# Patient Record
Sex: Female | Born: 1980 | Race: White | Hispanic: No | Marital: Married | Smoking: Former smoker
Health system: Southern US, Community
[De-identification: ages and names within clinical notes are randomized; demographics above are authoritative.]

## PROBLEM LIST (undated history)

## (undated) DIAGNOSIS — Z789 Other specified health status: Secondary | ICD-10-CM

## (undated) DIAGNOSIS — E785 Hyperlipidemia, unspecified: Secondary | ICD-10-CM

## (undated) DIAGNOSIS — Z8719 Personal history of other diseases of the digestive system: Secondary | ICD-10-CM

## (undated) DIAGNOSIS — R112 Nausea with vomiting, unspecified: Secondary | ICD-10-CM

## (undated) DIAGNOSIS — J189 Pneumonia, unspecified organism: Secondary | ICD-10-CM

## (undated) DIAGNOSIS — M1990 Unspecified osteoarthritis, unspecified site: Secondary | ICD-10-CM

## (undated) DIAGNOSIS — E282 Polycystic ovarian syndrome: Secondary | ICD-10-CM

## (undated) DIAGNOSIS — E669 Obesity, unspecified: Secondary | ICD-10-CM

## (undated) DIAGNOSIS — K859 Acute pancreatitis without necrosis or infection, unspecified: Secondary | ICD-10-CM

## (undated) DIAGNOSIS — D649 Anemia, unspecified: Secondary | ICD-10-CM

## (undated) DIAGNOSIS — N200 Calculus of kidney: Secondary | ICD-10-CM

## (undated) DIAGNOSIS — F419 Anxiety disorder, unspecified: Secondary | ICD-10-CM

## (undated) HISTORY — PX: TONSILLECTOMY AND ADENOIDECTOMY: SUR1326

## (undated) HISTORY — DX: Acute pancreatitis without necrosis or infection, unspecified: K85.90

## (undated) HISTORY — DX: Hyperlipidemia, unspecified: E78.5

## (undated) HISTORY — DX: Obesity, unspecified: E66.9

## (undated) HISTORY — DX: Polycystic ovarian syndrome: E28.2

## (undated) HISTORY — PX: WISDOM TOOTH EXTRACTION: SHX21

## (undated) HISTORY — DX: Calculus of kidney: N20.0

## (undated) SURGERY — Surgical Case
Anesthesia: *Unknown

---

## 2007-06-23 ENCOUNTER — Emergency Department (HOSPITAL_COMMUNITY): Admission: EM | Admit: 2007-06-23 | Discharge: 2007-06-23 | Payer: Self-pay | Admitting: Family Medicine

## 2008-03-13 ENCOUNTER — Emergency Department (HOSPITAL_COMMUNITY): Admission: EM | Admit: 2008-03-13 | Discharge: 2008-03-13 | Payer: Self-pay | Admitting: Emergency Medicine

## 2009-08-05 ENCOUNTER — Encounter (INDEPENDENT_AMBULATORY_CARE_PROVIDER_SITE_OTHER): Payer: Self-pay | Admitting: Otolaryngology

## 2009-08-05 ENCOUNTER — Ambulatory Visit (HOSPITAL_COMMUNITY): Admission: RE | Admit: 2009-08-05 | Discharge: 2009-08-06 | Payer: Self-pay | Admitting: Otolaryngology

## 2009-08-15 ENCOUNTER — Ambulatory Visit (HOSPITAL_COMMUNITY): Admission: EM | Admit: 2009-08-15 | Discharge: 2009-08-15 | Payer: Self-pay | Admitting: Emergency Medicine

## 2010-09-01 ENCOUNTER — Encounter: Payer: Self-pay | Admitting: Family Medicine

## 2010-09-09 LAB — POCT I-STAT, CHEM 8
BUN: 3 mg/dL — ABNORMAL LOW (ref 6–23)
Calcium, Ion: 1.11 mmol/L — ABNORMAL LOW (ref 1.12–1.32)
Chloride: 98 mEq/L (ref 96–112)
Creatinine, Ser: 0.7 mg/dL (ref 0.4–1.2)

## 2010-09-09 LAB — CBC
HCT: 41.2 % (ref 36.0–46.0)
Hemoglobin: 14 g/dL (ref 12.0–15.0)
Platelets: 335 10*3/uL (ref 150–400)
RBC: 4.95 MIL/uL (ref 3.87–5.11)
WBC: 8 10*3/uL (ref 4.0–10.5)

## 2012-09-10 ENCOUNTER — Other Ambulatory Visit: Payer: Self-pay | Admitting: Family Medicine

## 2012-09-10 ENCOUNTER — Other Ambulatory Visit (HOSPITAL_COMMUNITY)
Admission: RE | Admit: 2012-09-10 | Discharge: 2012-09-10 | Disposition: A | Discharge status: Home / Self Care | Payer: BC Managed Care – PPO | Source: Ambulatory Visit | Attending: Family Medicine | Admitting: Family Medicine

## 2012-09-10 DIAGNOSIS — Z01419 Encounter for gynecological examination (general) (routine) without abnormal findings: Secondary | ICD-10-CM | POA: Insufficient documentation

## 2014-07-20 ENCOUNTER — Ambulatory Visit (INDEPENDENT_AMBULATORY_CARE_PROVIDER_SITE_OTHER): Payer: 59 | Admitting: Family Medicine

## 2014-07-20 VITALS — BP 116/80 | HR 93 | Temp 97.8°F | Resp 18 | Ht 64.0 in | Wt 272.2 lb

## 2014-07-20 DIAGNOSIS — R10A0 Flank pain, unspecified side: Secondary | ICD-10-CM

## 2014-07-20 DIAGNOSIS — B354 Tinea corporis: Secondary | ICD-10-CM

## 2014-07-20 DIAGNOSIS — R109 Unspecified abdominal pain: Secondary | ICD-10-CM

## 2014-07-20 DIAGNOSIS — R1011 Right upper quadrant pain: Secondary | ICD-10-CM

## 2014-07-20 LAB — POCT CBC
Granulocyte percent: 62.5 %G (ref 37–80)
HCT, POC: 46.1 % (ref 37.7–47.9)
Hemoglobin: 14.6 g/dL (ref 12.2–16.2)
Lymph, poc: 2.7 (ref 0.6–3.4)
MCH, POC: 27.2 pg (ref 27–31.2)
MCHC: 31.6 g/dL — AB (ref 31.8–35.4)
MCV: 86.1 fL (ref 80–97)
MID (cbc): 0.6 (ref 0–0.9)
MPV: 7.9 fL (ref 0–99.8)
POC Granulocyte: 5.6 (ref 2–6.9)
POC LYMPH PERCENT: 30.4 %L (ref 10–50)
POC MID %: 7.1 %M (ref 0–12)
Platelet Count, POC: 394 10*3/uL (ref 142–424)
RBC: 5.36 M/uL (ref 4.04–5.48)
RDW, POC: 14.2 %
WBC: 9 10*3/uL (ref 4.6–10.2)

## 2014-07-20 LAB — COMPREHENSIVE METABOLIC PANEL
ALT: 14 U/L (ref 0–35)
AST: 16 U/L (ref 0–37)
Albumin: 4.5 g/dL (ref 3.5–5.2)
Alkaline Phosphatase: 78 U/L (ref 39–117)
BUN: 11 mg/dL (ref 6–23)
CO2: 26 mEq/L (ref 19–32)
Calcium: 10 mg/dL (ref 8.4–10.5)
Chloride: 100 mEq/L (ref 96–112)
Creat: 0.86 mg/dL (ref 0.50–1.10)
Glucose, Bld: 86 mg/dL (ref 70–99)
Potassium: 4.6 mEq/L (ref 3.5–5.3)
Sodium: 138 mEq/L (ref 135–145)
Total Bilirubin: 0.6 mg/dL (ref 0.2–1.2)
Total Protein: 7.6 g/dL (ref 6.0–8.3)

## 2014-07-20 MED ORDER — KETOCONAZOLE 2 % EX CREA: 1.0000 "application " | TOPICAL_CREAM | Freq: Two times a day (BID) | CUTANEOUS | Status: DC

## 2014-07-20 NOTE — Progress Notes (Signed)
° °  Subjective:    Patient ID: Alexis Howell, female    DOB: Mar 09, 1981, 34 y.o.   MRN: [id number] This chart was scribed for Elvina SidleKurt Lauenstein, MD by Littie Deedsichard Sun, Medical Scribe. This patient was seen in Room 10 and the patient's care was started at 10:20 AM.   HPI HPI Comments: Alexis Howell is a 34 y.o. female who presents to the [facility] complaining of gradual onset, intermittent, sharp RUQ abdominal pain radiating to her back and right shoulder that started months ago but worsened recently. She states her episodes of pain are very brief. She had difficulty sleeping last night due to the pain. Patient cannot associate her pain to any activity or time of day.  Patient also reports having a rash on the lateral aspect of her left upper leg for 2 months.  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 She also has a flexible job. She recently went [place] a trip to [place] including [place] with her husband. Patient is trying to have a child with her husband and is also seeking a PCP.  Review of Systems  Gastrointestinal: Positive for abdominal pain.  Musculoskeletal: Positive for back pain and arthralgias.  Skin: Positive for rash.       Objective:   Physical Exam CONSTITUTIONAL: Well developed/well nourished HEAD: Normocephalic/atraumatic EYES: EOM/PERRL ENMT: Mucous membranes moist NECK: supple no meningeal signs SPINE: entire spine nontender CV: S1/S2 noted, no murmurs/rubs/gallops noted LUNGS: Lungs are clear to auscultation bilaterally, no apparent distress ABDOMEN: Tender in RUQ and epigastrium. GU: no cva tenderness NEURO: Pt is awake/alert, moves all extremitiesx4 EXTREMITIES: pulses normal, full ROM SKIN: warm, color normal.  Annular lesion 2 cm with central clearing left thigh PSYCH: no abnormalities of mood noted   Results for orders placed or performed in visit on 07/20/14    POCT CBC  Result Value Ref Range   WBC 9.0 4.6 - 10.2 K/uL   Lymph, poc 2.7 0.6 - 3.4   POC LYMPH PERCENT 30.4 10 - 50 %L   MID (cbc) 0.6 0 - 0.9   POC MID % 7.1 0 - 12 %M   POC Granulocyte 5.6 2 - 6.9   Granulocyte percent 62.5 37 - 80 %G   RBC 5.36 4.04 - 5.48 M/uL   Hemoglobin 14.6 12.2 - 16.2 g/dL   HCT, POC 56.446.1 33.237.7 - 47.9 %   MCV 86.1 80 - 97 fL   MCH, POC 27.2 27 - 31.2 pg   MCHC 31.6 (A) 31.8 - 35.4 g/dL   RDW, POC 95.114.2 %   Platelet Count, POC 394 142 - 424 K/uL   MPV 7.9 0 - 99.8 fL        Assessment & Plan:   This chart was scribed in my presence and reviewed by me personally.    ICD-9-CM ICD-10-CM   1. RUQ pain 789.01 R10.11 US Abdomen Complete     CANCELED: US Abdomen Complete  2. Flank pain 789.09 R10.9 POCT CBC     Comprehensive metabolic panel     US Abdomen Complete     CANCELED: US Abdomen Complete  3. Tinea corporis 110.5 B35.4 ketoconazole (NIZORAL) 2 % cream     Signed, Elvina SidleKurt Lauenstein, MD

## 2014-07-20 NOTE — Patient Instructions (Addendum)
Cholelithiasis Cholelithiasis (also called gallstones) is a form of gallbladder disease in which gallstones form in your gallbladder. The gallbladder is an organ that stores bile made in the liver, which helps digest fats. Gallstones begin as small crystals and slowly grow into stones. Gallstone pain occurs when the gallbladder spasms and a gallstone is blocking the duct. Pain can also occur when a stone passes out of the duct.  RISK FACTORS  Being female.   Having multiple pregnancies. Health care providers sometimes advise removing diseased gallbladders before future pregnancies.   Being obese.  Eating a diet heavy in fried foods and fat.   Being older than 60 years and increasing age.   Prolonged use of medicines containing female hormones.   Having diabetes mellitus.   Rapidly losing weight.   Having a family history of gallstones (heredity).  SYMPTOMS  Nausea.   Vomiting.  Abdominal pain.   Yellowing of the skin (jaundice).   Sudden pain. It may persist from several minutes to several hours.  Fever.   Tenderness to the touch. In some cases, when gallstones do not move into the bile duct, people have no pain or symptoms. These are called "silent" gallstones.  TREATMENT Silent gallstones do not need treatment. In severe cases, emergency surgery may be required. Options for treatment include:  Surgery to remove the gallbladder. This is the most common treatment.  Medicines. These do not always work and may take 6-12 months or more to work.  Shock wave treatment (extracorporeal biliary lithotripsy). In this treatment an ultrasound machine sends shock waves to the gallbladder to break gallstones into smaller pieces that can pass into the intestines or be dissolved by medicine. HOME CARE INSTRUCTIONS   Only take over-the-counter or prescription medicines for pain, discomfort, or fever as directed by your health care provider.   Follow a low-fat diet until  seen again by your health care provider. Fat causes the gallbladder to contract, which can result in pain.   Follow up with your health care provider as directed. Attacks are almost always recurrent and surgery is usually required for permanent treatment.  SEEK IMMEDIATE MEDICAL CARE IF:   Your pain increases and is not controlled by medicines.   You have a fever or persistent symptoms for more than 2-3 days.   You have a fever and your symptoms suddenly get worse.   You have persistent nausea and vomiting.  MAKE SURE YOU:   Understand these instructions.  Will watch your condition.  Will get help right away if you are not doing well or get worse. Document Released: 06/02/2005 Document Revised: 02/06/2013 Document Reviewed: 11/28/2012 [facility] Patient Information 2015 [place], [place]. This information is not intended to replace advice given to you by your health care provider. Make sure you discuss any questions you have with your health care provider. Body Ringworm Ringworm (tinea corporis) is a fungal infection of the skin on the body. This infection is not caused by worms, but is actually caused by a fungus. Fungus normally lives on the top of your skin and can be useful. However, in the case of ringworms, the fungus grows out of control and causes a skin infection. It can involve any area of skin on the body and can spread easily from one person to another (contagious). Ringworm is a common problem for children, but it can affect adults as well. Ringworm is also often found in athletes, especially wrestlers who share equipment and mats.  CAUSES  Ringworm of the body  is caused by a fungus called dermatophyte. It can spread by:  Touchingother people who are infected.  Touchinginfected pets.  Touching or sharingobjects that have been in contact with the infected person or pet (hats, combs, towels, clothing, sports equipment). SYMPTOMS   Itchy, raised red spots and bumps on  the skin.  Ring-shaped rash.  Redness near the border of the rash with a clear center.  Dry and scaly skin on or around the rash. Not every person develops a ring-shaped rash. Some develop only the red, scaly patches. DIAGNOSIS  Most often, ringworm can be diagnosed by performing a skin exam. Your caregiver may choose to take a skin scraping from the affected area. The sample will be examined under the microscope to see if the fungus is present.  TREATMENT  Body ringworm may be treated with a topical antifungal cream or ointment. Sometimes, an antifungal shampoo that can be used on your body is prescribed. You may be prescribed antifungal medicines to take by mouth if your ringworm is severe, keeps coming back, or lasts a long time.  HOME CARE INSTRUCTIONS   Only take over-the-counter or prescription medicines as directed by your caregiver.  Wash the infected area and dry it completely before applying yourcream or ointment.  When using antifungal shampoo to treat the ringworm, leave the shampoo on the body for 3-5 minutes before rinsing.   Wear loose clothing to stop clothes from rubbing and irritating the rash.  Wash or change your bed sheets every night while you have the rash.  Have your pet treated by your veterinarian if it has the same infection. To prevent ringworm:   Practice good hygiene.  Wear sandals or shoes in public places and showers.  Do not share personal items with others.  Avoid touching red patches of skin on other people.  Avoid touching pets that have bald spots or wash your hands after doing so. SEEK MEDICAL CARE IF:   Your rash continues to spread after 7 days of treatment.  Your rash is not gone in 4 weeks.  The area around your rash becomes red, warm, tender, and swollen. Document Released: 06/03/2000 Document Revised: 02/29/2012 Document Reviewed: 12/19/2011 [facility] Patient Information 2015 [place]. This information is not intended to  replace advice given to you by your health care provider. Make sure you discuss any questions you have with your health care provider.

## 2014-07-21 ENCOUNTER — Ambulatory Visit (HOSPITAL_COMMUNITY)
Admission: RE | Admit: 2014-07-21 | Discharge: 2014-07-21 | Disposition: A | Discharge status: Home / Self Care | Payer: 59 | Source: Ambulatory Visit | Attending: Family Medicine | Admitting: Family Medicine

## 2014-07-21 ENCOUNTER — Other Ambulatory Visit: Payer: Self-pay | Admitting: Family Medicine

## 2014-07-21 DIAGNOSIS — K8020 Calculus of gallbladder without cholecystitis without obstruction: Secondary | ICD-10-CM

## 2014-07-21 DIAGNOSIS — K760 Fatty (change of) liver, not elsewhere classified: Secondary | ICD-10-CM | POA: Insufficient documentation

## 2014-07-21 DIAGNOSIS — R109 Unspecified abdominal pain: Secondary | ICD-10-CM

## 2014-07-21 DIAGNOSIS — R1011 Right upper quadrant pain: Secondary | ICD-10-CM | POA: Diagnosis present

## 2014-07-22 ENCOUNTER — Telehealth: Payer: Self-pay

## 2014-07-22 NOTE — Telephone Encounter (Signed)
Pt calling with questions about upcoming appt w/ [facility]. Patient wants to know details of appt and results of ultrasound. Please advise. CB # [phone]

## 2014-07-23 ENCOUNTER — Other Ambulatory Visit: Payer: Self-pay

## 2014-07-23 NOTE — Telephone Encounter (Signed)
Spoke with pt, I explained to her that her ultrasound showed she does have gallstones. Her referral to the surgeon is to see if she may need to have them removed because this may be causing her pain. Pt states she will try over the counter medication for the pain for now. I advised her to let me know if she needs anything stronger if the pain gets worse. Pt agreed with plan.

## 2014-07-29 NOTE — Pre-Procedure Instructions (Signed)
[facility]hel[facility]  07/29/2014   Your procedure is scheduled on: Friday, Feb. 12th    [facility] at  8:30 AM.   Call this number if you have problems the morning of surgery: [phone]   Remember:   Do not eat food or drink liquids after midnight Thursday.   Take these medicines the morning of surgery with A SIP OF WATER: none   Do not wear jewelry, make-up or nail polish.  Do not wear lotions, powders, or perfumes. You may NOT wear deodorant the day of surgery.  Do not shave underarms & legs 48 hours prior to surgery.    Do not bring valuables to the hospital.  [facility] is not responsible for any belongings or valuables.               Contacts, dentures or bridgework may not be worn into surgery.  Leave suitcase in the car. After surgery it may be brought to your room.  For patients admitted to the hospital, discharge time is determined by your treatment team.               Patients discharged the day of surgery will not be allowed to drive home.   Name and phone number of your driver:    Special Instructions: "Preparing for Surgery" instruction sheet.     Please read over the following fact sheets that you were given: Pain Booklet and Surgical Site Infection Prevention

## 2014-07-30 ENCOUNTER — Encounter (HOSPITAL_COMMUNITY): Payer: Self-pay

## 2014-07-30 ENCOUNTER — Ambulatory Visit (INDEPENDENT_AMBULATORY_CARE_PROVIDER_SITE_OTHER): Payer: Self-pay | Admitting: General Surgery

## 2014-07-30 ENCOUNTER — Encounter (HOSPITAL_COMMUNITY)
Admission: RE | Admit: 2014-07-30 | Discharge: 2014-07-30 | Disposition: A | Discharge status: Home / Self Care | Payer: 59 | Source: Ambulatory Visit | Attending: General Surgery | Admitting: General Surgery

## 2014-07-30 DIAGNOSIS — K8020 Calculus of gallbladder without cholecystitis without obstruction: Secondary | ICD-10-CM | POA: Diagnosis present

## 2014-07-30 DIAGNOSIS — Z5309 Procedure and treatment not carried out because of other contraindication: Secondary | ICD-10-CM | POA: Diagnosis not present

## 2014-07-30 LAB — CBC
HCT: 44.6 % (ref 36.0–46.0)
HEMOGLOBIN: 14.7 g/dL (ref 12.0–15.0)
MCH: 28.2 pg (ref 26.0–34.0)
MCHC: 33 g/dL (ref 30.0–36.0)
MCV: 85.6 fL (ref 78.0–100.0)
Platelets: 355 10*3/uL (ref 150–400)
RBC: 5.21 MIL/uL — AB (ref 3.87–5.11)
RDW: 13.4 % (ref 11.5–15.5)
WBC: 9.7 10*3/uL (ref 4.0–10.5)

## 2014-07-30 LAB — HCG, SERUM, QUALITATIVE: PREG SERUM: POSITIVE — AB

## 2014-07-30 LAB — BASIC METABOLIC PANEL
Anion gap: 11 (ref 5–15)
BUN: 11 mg/dL (ref 6–23)
CHLORIDE: 102 mmol/L (ref 96–112)
CO2: 24 mmol/L (ref 19–32)
CREATININE: 0.84 mg/dL (ref 0.50–1.10)
Calcium: 9.8 mg/dL (ref 8.4–10.5)
GFR calc Af Amer: >90 mL/min (ref >90)
GFR calc non Af Amer: >90 mL/min (ref >90)
GLUCOSE: 86 mg/dL (ref 70–99)
Potassium: 5 mmol/L (ref 3.5–5.1)
Sodium: 137 mmol/L (ref 135–145)

## 2014-07-30 NOTE — H&P (Signed)
Alexis Howell 07/24/2014 1:26 PM Location: [facility] Patient #: [id number] DOB: 05/08/1981 Married / Language: English / Race: White Female  History of Present Illness (Alexis Howell M. Alexis Winebarger MD; 07/24/2014 2:00 PM) Patient words: gallstones.  The patient is a 34 year old female who presents for evaluation of gall stones. She is referred by Dr Lauenstein for evaluation of gallstones and abdominal pain. She states that she has been having some stabbing right upper quadrant pain on and off for several years that was last for about 30 seconds at a time. She initially attributed to menstrual cramps. However last week she developed pain underneath her right rib cage that radiated toward her shoulder. It lasted for about 30 minutes. She states that she's been having some ongoing soreness in that area. She had another episode Saturday night. These episodes are generally accompanied with nausea. They also generally occur about half an hour after eating. She denies any fever, chills, vomiting, diarrhea or constipation. She denies melena or hematochezia. She denies acholic stools. She denies Nsaid use. She and her husband are trying to get pregnant. She underwent an abdominal ultrasound which revealed a contracted gallbladder with numerous gallstones. No evidence of wall thickness or pericholecystic fluid. Her comprehensive metabolic panel and CBC were within normal limits.   Other Problems ([name] M [name], MD; 07/24/2014 1:59 PM) No pertinent past medical history SYMPTOMATIC CHOLELITHIASIS (574.20  K80.20)  Past Surgical History ([name], CMA; 07/24/2014 1:26 PM) Oral Surgery Tonsillectomy  Diagnostic Studies History ([name], CMA; 07/24/2014 1:26 PM) Mammogram 1-3 years ago  Allergies ([name], CMA; 07/24/2014 1:27 PM) No Known Drug Allergies02/09/2014  Medication History ([name], CMA; 07/24/2014 1:27 PM) No Current Medications  Social History (Sonya  Bynum, CMA; 07/24/2014 1:26 PM) Alcohol use Occasional alcohol use. Caffeine use Coffee. No drug use Tobacco use Former smoker.  Family History ([name], CMA; 07/24/2014 1:26 PM) First Degree Relatives No pertinent family history  Pregnancy / Birth History ([name], CMA; 07/24/2014 1:26 PM) Age at menarche 16 years. Contraceptive History Oral contraceptives. Gravida 0 Para 0 Regular periods  Review of Systems ([name] CMA; 07/24/2014 1:26 PM) General Present- Appetite Loss, Fatigue and Night Sweats. Not Present- Chills, Fever, Weight Gain and Weight Loss. HEENT Not Present- Earache, Hearing Loss, Hoarseness, Nose Bleed, Oral Ulcers, Ringing in the Ears, Seasonal Allergies, Sinus Pain, Sore Throat, Visual Disturbances, Wears glasses/contact lenses and Yellow Eyes. Respiratory Not Present- Bloody sputum, Chronic Cough, Difficulty Breathing, Snoring and Wheezing. Breast Not Present- Breast Mass, Breast Pain, Nipple Discharge and Skin Changes. Cardiovascular Not Present- Chest Pain, Difficulty Breathing Lying Down, Leg Cramps, Palpitations, Rapid Heart Rate, Shortness of Breath and Swelling of Extremities. Gastrointestinal Present- Abdominal Pain, Bloating, Nausea and Vomiting. Not Present- Bloody Stool, Change in Bowel Habits, Chronic diarrhea, Constipation, Difficulty Swallowing, Excessive gas, Gets full quickly at meals, Hemorrhoids, Indigestion and Rectal Pain. Female Genitourinary Not Present- Frequency, Nocturia, Painful Urination, Pelvic Pain and Urgency. Musculoskeletal Present- Back Pain. Not Present- Joint Pain, Joint Stiffness, Muscle Pain, Muscle Weakness and Swelling of Extremities. Neurological Not Present- Decreased Memory, Fainting, Headaches, Numbness, Seizures, Tingling, Tremor, Trouble walking and Weakness. Psychiatric Present- Change in Sleep Pattern. Not Present- Anxiety, Bipolar, Depression, Fearful and Frequent crying. Endocrine Not Present- Cold  Intolerance, Excessive Hunger, Hair Changes, Heat Intolerance, Hot flashes and New Diabetes. Hematology Not Present- Easy Bruising, Excessive bleeding, Gland problems, HIV and Persistent Infections.   Vitals ([name] CMA; 07/24/2014 1:27 PM) 07/24/2014 1:27 PM Weight: 274 lb   Height: 63in Body Surface Area: 2.35 m Body Mass Index: 48.54 kg/m Temp.: 97.4F(Temporal)  Pulse: 77 (Regular)  BP: 130/78 (Sitting, Left Arm, Standard)    Physical Exam (Chay Mazzoni M. Brooks Stotz MD; 07/24/2014 1:55 PM) General Mental Status-Alert. General Appearance-Consistent with stated age. Hydration-Well hydrated. Voice-Normal.  Head and Neck Head-normocephalic, atraumatic with no lesions or palpable masses. Trachea-midline. Thyroid Gland Characteristics - normal size and consistency.  Eye Eyeball - Bilateral-Extraocular movements intact. Sclera/Conjunctiva - Bilateral-No scleral icterus.  Chest and Lung Exam Chest and lung exam reveals -quiet, even and easy respiratory effort with no use of accessory muscles and on auscultation, normal breath sounds, no adventitious sounds and normal vocal resonance. Inspection Chest Wall - Normal. Back - normal.  Breast - Did not examine.  Cardiovascular Cardiovascular examination reveals -normal heart sounds, regular rate and rhythm with no murmurs and normal pedal pulses bilaterally.  Abdomen Inspection Inspection of the abdomen reveals - No Hernias. Skin - Scar - no surgical scars. Palpation/Percussion Palpation and Percussion of the abdomen reveal - Soft, No Rebound tenderness, No Rigidity (guarding) and No hepatosplenomegaly. Note: mild RUQ TTP. Auscultation Auscultation of the abdomen reveals - Bowel sounds normal.  Peripheral Vascular Upper Extremity Palpation - Pulses bilaterally normal.  Neurologic Neurologic evaluation reveals -alert and oriented x 3 with no impairment of recent or remote memory. Mental  Status-Normal.  Neuropsychiatric The patient's mood and affect are described as -normal. Judgment and Insight-insight is appropriate concerning matters relevant to self.  Musculoskeletal Normal Exam - Left-Upper Extremity Strength Normal and Lower Extremity Strength Normal. Normal Exam - Right-Upper Extremity Strength Normal and Lower Extremity Strength Normal.  Lymphatic Head & Neck  General Head & Neck Lymphatics: Bilateral - Description - Normal. Axillary - Did not examine. Femoral & Inguinal - Did not examine.    Assessment & Plan (Khamiyah Grefe M. Trystyn Dolley MD; 07/24/2014 1:59 PM) SYMPTOMATIC CHOLELITHIASIS (574.20  K80.20) Impression: Her symptoms are pretty classic for gallbladder disease. Between now and surgery of advised her to try to refrain from getting pregnant Current Plans  I believe the patient's symptoms are consistent with gallbladder disease.  We discussed gallbladder disease. The patient was given educational material. We discussed non-operative and operative management. We discussed the signs & symptoms of acute cholecystitis  I discussed laparoscopic cholecystectomy with IOC in detail. The patient was given educational material as well as diagrams detailing the procedure. We discussed the risks and benefits of a laparoscopic cholecystectomy including, but not limited to bleeding, infection, injury to surrounding structures such as the intestine or liver, bile leak, retained gallstones, need to convert to an open procedure, prolonged diarrhea, blood clots such as DVT, common bile duct injury, anesthesia risks, and possible need for additional procedures. We discussed the typical post-operative recovery course. I explained that the likelihood of improvement of their symptoms is good.  The patient has elected to proceed with surgery. Schedule for Surgery  Ruqayya Ventress M. Deke Tilghman, MD, FACS General, Bariatric, & Minimally Invasive Surgery [facility]  

## 2014-07-31 ENCOUNTER — Ambulatory Visit (HOSPITAL_COMMUNITY): Payer: 59 | Admitting: Anesthesiology

## 2014-07-31 ENCOUNTER — Encounter (HOSPITAL_COMMUNITY)
Admission: RE | Admit: 2014-07-31 | Discharge: 2014-07-31 | Disposition: A | Discharge status: Home / Self Care | Payer: 59 | Source: Ambulatory Visit | Attending: General Surgery | Admitting: General Surgery

## 2014-07-31 ENCOUNTER — Other Ambulatory Visit (INDEPENDENT_AMBULATORY_CARE_PROVIDER_SITE_OTHER): Payer: Self-pay | Admitting: General Surgery

## 2014-07-31 ENCOUNTER — Ambulatory Visit (INDEPENDENT_AMBULATORY_CARE_PROVIDER_SITE_OTHER): Payer: Self-pay | Admitting: General Surgery

## 2014-07-31 DIAGNOSIS — K8020 Calculus of gallbladder without cholecystitis without obstruction: Secondary | ICD-10-CM | POA: Diagnosis not present

## 2014-07-31 DIAGNOSIS — Z01818 Encounter for other preprocedural examination: Secondary | ICD-10-CM

## 2014-07-31 LAB — HCG, SERUM, QUALITATIVE: PREG SERUM: NEGATIVE

## 2014-07-31 MED ORDER — DEXTROSE 5 % IV SOLN
2.0000 g | INTRAVENOUS | Status: DC
  Filled 2014-07-31 (×2): qty 2

## 2014-07-31 NOTE — Progress Notes (Signed)
Spoke with Kenney Housemananya, nurse at Dr. Tawana ScaleWilson's office to make MD aware of negative repeat pregnancy test.

## 2014-07-31 NOTE — Progress Notes (Signed)
Labs drawn at yesterday after PAT appt.  I received call from lab stating the patient may be pregnant.  I called [facility] to let them know and was instructed to have patient come back in for another serum blood test for pregnancy.  I relayed info to patient and she will be back in around 3 pm today for redraw.  DA

## 2014-07-31 NOTE — Progress Notes (Signed)
Dr.Wilson called and stated that he is putting an order in for a third pregnancy test. 1st preg test came back positive,2nd came back neg.Pt is however trying to conceive but has issues with fertility.If this test is positive then surgery will be cancelled and Dr.Wilson has talked with pt about this.

## 2014-08-01 ENCOUNTER — Encounter (HOSPITAL_COMMUNITY): Payer: Self-pay | Admitting: *Deleted

## 2014-08-01 ENCOUNTER — Encounter (HOSPITAL_COMMUNITY)
Admission: RE | Disposition: A | Discharge status: Home / Self Care | Payer: Self-pay | Source: Ambulatory Visit | Attending: General Surgery

## 2014-08-01 ENCOUNTER — Other Ambulatory Visit (INDEPENDENT_AMBULATORY_CARE_PROVIDER_SITE_OTHER): Payer: Self-pay | Admitting: General Surgery

## 2014-08-01 ENCOUNTER — Ambulatory Visit (HOSPITAL_COMMUNITY)
Admission: RE | Admit: 2014-08-01 | Discharge: 2014-08-01 | Disposition: A | Discharge status: Home / Self Care | Payer: 59 | Source: Ambulatory Visit | Attending: General Surgery | Admitting: General Surgery

## 2014-08-01 DIAGNOSIS — Z5309 Procedure and treatment not carried out because of other contraindication: Secondary | ICD-10-CM | POA: Insufficient documentation

## 2014-08-01 DIAGNOSIS — K8020 Calculus of gallbladder without cholecystitis without obstruction: Secondary | ICD-10-CM | POA: Diagnosis not present

## 2014-08-01 LAB — HCG, SERUM, QUALITATIVE: Preg, Serum: POSITIVE

## 2014-08-01 LAB — HCG, QUANTITATIVE, PREGNANCY

## 2014-08-01 SURGERY — LAPAROSCOPIC CHOLECYSTECTOMY WITH INTRAOPERATIVE CHOLANGIOGRAM
Anesthesia: General

## 2014-08-01 MED ORDER — PROPOFOL 10 MG/ML IV BOLUS
INTRAVENOUS | Status: AC
  Filled 2014-08-01: qty 20

## 2014-08-01 MED ORDER — CHLORHEXIDINE GLUCONATE 4 % EX LIQD
1.0000 "application " | Freq: Once | CUTANEOUS | Status: DC
  Filled 2014-08-01: qty 15

## 2014-08-01 MED ORDER — LIDOCAINE HCL (CARDIAC) 20 MG/ML IV SOLN
INTRAVENOUS | Status: AC
  Filled 2014-08-01: qty 5

## 2014-08-01 MED ORDER — DEXAMETHASONE SODIUM PHOSPHATE 4 MG/ML IJ SOLN
INTRAMUSCULAR | Status: AC
  Filled 2014-08-01: qty 1

## 2014-08-01 MED ORDER — ONDANSETRON HCL 4 MG/2ML IJ SOLN
INTRAMUSCULAR | Status: AC
  Filled 2014-08-01: qty 2

## 2014-08-01 MED ORDER — SODIUM CHLORIDE 0.9 % IR SOLN
Status: DC | PRN
  Administered 2014-08-01: 1000 mL

## 2014-08-01 MED ORDER — 0.9 % SODIUM CHLORIDE (POUR BTL) OPTIME
TOPICAL | Status: DC | PRN
  Administered 2014-08-01: 1000 mL

## 2014-08-01 MED ORDER — ONDANSETRON 4 MG PO TBDP
4.0000 mg | ORAL_TABLET | Freq: Three times a day (TID) | ORAL | Status: DC | PRN
Start: 2014-08-01 — End: 2016-05-23

## 2014-08-01 MED ORDER — IOHEXOL 300 MG/ML  SOLN
INTRAMUSCULAR | Status: DC | PRN
  Administered 2014-08-01: 11:00:00

## 2014-08-01 MED ORDER — BUPIVACAINE-EPINEPHRINE 0.25% -1:200000 IJ SOLN
INTRAMUSCULAR | Status: DC | PRN
  Administered 2014-08-01: 30 mL

## 2014-08-01 MED ORDER — MIDAZOLAM HCL 2 MG/2ML IJ SOLN
INTRAMUSCULAR | Status: AC
  Filled 2014-08-01: qty 2

## 2014-08-01 MED ORDER — LACTATED RINGERS IV SOLN
INTRAVENOUS | Status: DC
  Administered 2014-08-01: 10:00:00 via INTRAVENOUS

## 2014-08-01 MED ORDER — FENTANYL CITRATE 0.05 MG/ML IJ SOLN
INTRAMUSCULAR | Status: AC
  Filled 2014-08-01: qty 5

## 2014-08-01 SURGICAL SUPPLY — 40 items
APPLIER CLIP 5 13 M/L LIGAMAX5 (MISCELLANEOUS) ×2
BANDAGE ADH SHEER 1  50/CT (GAUZE/BANDAGES/DRESSINGS) ×6 IMPLANT
BENZOIN TINCTURE PRP APPL 2/3 (GAUZE/BANDAGES/DRESSINGS) ×2 IMPLANT
BLADE SURG ROTATE 9660 (MISCELLANEOUS) IMPLANT
CANISTER SUCTION 2500CC (MISCELLANEOUS) ×2 IMPLANT
CHLORAPREP W/TINT 26ML (MISCELLANEOUS) ×2 IMPLANT
CLIP APPLIE 5 13 M/L LIGAMAX5 (MISCELLANEOUS) ×1 IMPLANT
COVER MAYO STAND STRL (DRAPES) ×2 IMPLANT
COVER SURGICAL LIGHT HANDLE (MISCELLANEOUS) ×2 IMPLANT
DRAPE C-ARM 42X72 X-RAY (DRAPES) ×2 IMPLANT
DRAPE LAPAROSCOPIC ABDOMINAL (DRAPES) ×2 IMPLANT
DRSG TEGADERM 4X4.75 (GAUZE/BANDAGES/DRESSINGS) ×2 IMPLANT
ELECT REM PT RETURN 9FT ADLT (ELECTROSURGICAL) ×2
ELECTRODE REM PT RTRN 9FT ADLT (ELECTROSURGICAL) ×1 IMPLANT
GAUZE SPONGE 2X2 8PLY STRL LF (GAUZE/BANDAGES/DRESSINGS) ×1 IMPLANT
GLOVE BIOGEL M STRL SZ7.5 (GLOVE) ×2 IMPLANT
GLOVE BIOGEL PI IND STRL 8 (GLOVE) ×1 IMPLANT
GLOVE BIOGEL PI INDICATOR 8 (GLOVE) ×1
GOWN STRL REUS W/ TWL LRG LVL3 (GOWN DISPOSABLE) ×3 IMPLANT
GOWN STRL REUS W/ TWL XL LVL3 (GOWN DISPOSABLE) ×1 IMPLANT
GOWN STRL REUS W/TWL LRG LVL3 (GOWN DISPOSABLE) ×3
GOWN STRL REUS W/TWL XL LVL3 (GOWN DISPOSABLE) ×1
KIT BASIN OR (CUSTOM PROCEDURE TRAY) ×2 IMPLANT
KIT ROOM TURNOVER OR (KITS) ×2 IMPLANT
NS IRRIG 1000ML POUR BTL (IV SOLUTION) ×2 IMPLANT
PAD ARMBOARD 7.5X6 YLW CONV (MISCELLANEOUS) ×2 IMPLANT
POUCH SPECIMEN RETRIEVAL 10MM (ENDOMECHANICALS) ×2 IMPLANT
SCISSORS LAP 5X35 DISP (ENDOMECHANICALS) ×2 IMPLANT
SET CHOLANGIOGRAPH 5 50 .035 (SET/KITS/TRAYS/PACK) ×2 IMPLANT
SET IRRIG TUBING LAPAROSCOPIC (IRRIGATION / IRRIGATOR) ×2 IMPLANT
SLEEVE ENDOPATH XCEL 5M (ENDOMECHANICALS) ×4 IMPLANT
SPECIMEN JAR SMALL (MISCELLANEOUS) ×2 IMPLANT
SPONGE GAUZE 2X2 STER 10/PKG (GAUZE/BANDAGES/DRESSINGS) ×1
SUT MNCRL AB 4-0 PS2 18 (SUTURE) ×2 IMPLANT
TOWEL OR 17X24 6PK STRL BLUE (TOWEL DISPOSABLE) ×2 IMPLANT
TOWEL OR 17X26 10 PK STRL BLUE (TOWEL DISPOSABLE) ×2 IMPLANT
TRAY LAPAROSCOPIC (CUSTOM PROCEDURE TRAY) ×2 IMPLANT
TROCAR XCEL BLUNT TIP 100MML (ENDOMECHANICALS) ×2 IMPLANT
TROCAR XCEL NON-BLD 5MMX100MML (ENDOMECHANICALS) ×2 IMPLANT
TUBING INSUFFLATION (TUBING) ×2 IMPLANT

## 2014-08-01 NOTE — Progress Notes (Signed)
Per Dr. Andrey CampanileWilson, will not do surgery today due to weakly positive pregnancy tests x2. Pt to await quantitative results prior to DC home.

## 2014-08-01 NOTE — Progress Notes (Signed)
Since pregnancy tests are inconclusive (wed = positive; Thursday = negative, today = weakly positive) and pt has been trying to get pregnant I feel safest course is to postpone surgery until she has been evaluated by an OB. If she is truly pregnant then it would not be safe to proceed with surgery today. If pt is truly not pregnant, then she will be rescheduled asap. i instructed pt to contact office next week once she has seen OB.

## 2014-08-01 NOTE — Anesthesia Preprocedure Evaluation (Addendum)
Anesthesia Evaluation  Patient identified by MRN, date of birth, ID band Patient awake    Reviewed: Allergy & Precautions, NPO status , Patient's Chart, lab work & pertinent test results, reviewed documented beta blocker date and time   Airway Mallampati: II  TM Distance: >3 FB Neck ROM: Full    Dental no notable dental hx. (+) Teeth Intact   Pulmonary former smoker,  breath sounds clear to auscultation  Pulmonary exam normal       Cardiovascular negative cardio ROS  Rhythm:Regular Rate:Normal     Neuro/Psych negative neurological ROS  negative psych ROS   GI/Hepatic Neg liver ROS, Symptomatic Cholelithiasis   Endo/Other  Morbid obesity  Renal/GU negative Renal ROS  negative genitourinary   Musculoskeletal negative musculoskeletal ROS (+)   Abdominal (+) + obese,   Peds  Hematology negative hematology ROS (+)   Anesthesia Other Findings   Reproductive/Obstetrics Had positive pregnancy test 2 days ago Negative 2 days ago repeat for today.                            Anesthesia Physical Anesthesia Plan  ASA: III  Anesthesia Plan: General   Post-op Pain Management:    Induction: Intravenous, Rapid sequence and Cricoid pressure planned  Airway Management Planned: Oral ETT  Additional Equipment:   Intra-op Plan:   Post-operative Plan: Extubation in OR  Informed Consent: I have reviewed the patients History and Physical, chart, labs and discussed the procedure including the risks, benefits and alternatives for the proposed anesthesia with the patient or authorized representative who has indicated his/her understanding and acceptance.   Dental advisory given  Plan Discussed with: CRNA, Anesthesiologist and Surgeon  Anesthesia Plan Comments:         Anesthesia Quick Evaluation

## 2014-08-01 NOTE — Progress Notes (Signed)
Pt aware of negative quantitative results, DC home with husband and personal belongings.

## 2014-08-07 ENCOUNTER — Encounter (HOSPITAL_COMMUNITY): Payer: Self-pay | Admitting: *Deleted

## 2014-08-07 NOTE — Progress Notes (Signed)
Please put orders in Epic for Same Day surgery 08-12-14 Thanks

## 2014-08-10 ENCOUNTER — Ambulatory Visit (INDEPENDENT_AMBULATORY_CARE_PROVIDER_SITE_OTHER): Payer: Self-pay | Admitting: General Surgery

## 2014-08-12 ENCOUNTER — Ambulatory Visit (HOSPITAL_COMMUNITY): Payer: 59 | Admitting: Anesthesiology

## 2014-08-12 ENCOUNTER — Encounter (HOSPITAL_COMMUNITY)
Admission: RE | Disposition: A | Discharge status: Home / Self Care | Payer: Self-pay | Source: Ambulatory Visit | Attending: General Surgery

## 2014-08-12 ENCOUNTER — Ambulatory Visit (HOSPITAL_COMMUNITY)
Admission: RE | Admit: 2014-08-12 | Discharge: 2014-08-12 | Disposition: A | Discharge status: Home / Self Care | Payer: 59 | Source: Ambulatory Visit | Attending: General Surgery | Admitting: General Surgery

## 2014-08-12 ENCOUNTER — Encounter (HOSPITAL_COMMUNITY): Payer: Self-pay | Admitting: *Deleted

## 2014-08-12 ENCOUNTER — Ambulatory Visit (HOSPITAL_COMMUNITY): Payer: 59

## 2014-08-12 DIAGNOSIS — Z6841 Body Mass Index (BMI) 40.0 and over, adult: Secondary | ICD-10-CM | POA: Insufficient documentation

## 2014-08-12 DIAGNOSIS — K8020 Calculus of gallbladder without cholecystitis without obstruction: Secondary | ICD-10-CM

## 2014-08-12 DIAGNOSIS — K8010 Calculus of gallbladder with chronic cholecystitis without obstruction: Secondary | ICD-10-CM | POA: Diagnosis not present

## 2014-08-12 DIAGNOSIS — Z87891 Personal history of nicotine dependence: Secondary | ICD-10-CM | POA: Insufficient documentation

## 2014-08-12 HISTORY — PX: CHOLECYSTECTOMY: SHX55

## 2014-08-12 SURGERY — LAPAROSCOPIC CHOLECYSTECTOMY WITH INTRAOPERATIVE CHOLANGIOGRAM
Anesthesia: General | Site: Abdomen

## 2014-08-12 MED ORDER — METOCLOPRAMIDE HCL 5 MG/ML IJ SOLN
INTRAMUSCULAR | Status: DC | PRN
  Administered 2014-08-12: 10 mg via INTRAVENOUS

## 2014-08-12 MED ORDER — ONDANSETRON HCL 4 MG/2ML IJ SOLN
INTRAMUSCULAR | Status: AC
  Filled 2014-08-12: qty 2

## 2014-08-12 MED ORDER — OXYCODONE HCL 5 MG PO TABS
5.0000 mg | ORAL_TABLET | ORAL | Status: DC | PRN
  Administered 2014-08-12: 5 mg via ORAL
  Filled 2014-08-12: qty 1

## 2014-08-12 MED ORDER — POTASSIUM CHLORIDE IN NACL 20-0.9 MEQ/L-% IV SOLN: INTRAVENOUS | Status: DC

## 2014-08-12 MED ORDER — IOHEXOL 300 MG/ML  SOLN
INTRAMUSCULAR | Status: DC | PRN
  Administered 2014-08-12: 4 mL via INTRAVENOUS

## 2014-08-12 MED ORDER — PROPOFOL 10 MG/ML IV BOLUS
INTRAVENOUS | Status: DC | PRN
  Administered 2014-08-12: 200 mg via INTRAVENOUS

## 2014-08-12 MED ORDER — CHLORHEXIDINE GLUCONATE 4 % EX LIQD: 1.0000 "application " | Freq: Once | CUTANEOUS | Status: DC

## 2014-08-12 MED ORDER — FENTANYL CITRATE 0.05 MG/ML IJ SOLN
INTRAMUSCULAR | Status: AC
  Filled 2014-08-12: qty 5

## 2014-08-12 MED ORDER — LIDOCAINE HCL (CARDIAC) 20 MG/ML IV SOLN
INTRAVENOUS | Status: DC | PRN
  Administered 2014-08-12: 75 mg via INTRAVENOUS

## 2014-08-12 MED ORDER — CEFOXITIN SODIUM 2 G IV SOLR
2.0000 g | INTRAVENOUS | Status: AC
  Administered 2014-08-12: 2 g via INTRAVENOUS

## 2014-08-12 MED ORDER — GLYCOPYRROLATE 0.2 MG/ML IJ SOLN
INTRAMUSCULAR | Status: AC
  Filled 2014-08-12: qty 4

## 2014-08-12 MED ORDER — 0.9 % SODIUM CHLORIDE (POUR BTL) OPTIME
TOPICAL | Status: DC | PRN
  Administered 2014-08-12: 1000 mL

## 2014-08-12 MED ORDER — FENTANYL CITRATE 0.05 MG/ML IJ SOLN
INTRAMUSCULAR | Status: AC
  Filled 2014-08-12: qty 2

## 2014-08-12 MED ORDER — FENTANYL CITRATE 0.05 MG/ML IJ SOLN
INTRAMUSCULAR | Status: DC | PRN
  Administered 2014-08-12: 50 ug via INTRAVENOUS
  Administered 2014-08-12: 100 ug via INTRAVENOUS
  Administered 2014-08-12 (×2): 50 ug via INTRAVENOUS

## 2014-08-12 MED ORDER — SODIUM CHLORIDE 0.9 % IJ SOLN: 3.0000 mL | Freq: Two times a day (BID) | INTRAMUSCULAR | Status: DC

## 2014-08-12 MED ORDER — DEXTROSE 5 % IV SOLN
INTRAVENOUS | Status: AC
  Filled 2014-08-12: qty 2

## 2014-08-12 MED ORDER — ROCURONIUM BROMIDE 100 MG/10ML IV SOLN
INTRAVENOUS | Status: AC
  Filled 2014-08-12: qty 1

## 2014-08-12 MED ORDER — MIDAZOLAM HCL 2 MG/2ML IJ SOLN
INTRAMUSCULAR | Status: AC
  Filled 2014-08-12: qty 2

## 2014-08-12 MED ORDER — LIDOCAINE HCL (CARDIAC) 20 MG/ML IV SOLN
INTRAVENOUS | Status: AC
  Filled 2014-08-12: qty 5

## 2014-08-12 MED ORDER — ONDANSETRON HCL 4 MG/2ML IJ SOLN
INTRAMUSCULAR | Status: DC | PRN
  Administered 2014-08-12: 4 mg via INTRAVENOUS

## 2014-08-12 MED ORDER — NEOSTIGMINE METHYLSULFATE 10 MG/10ML IV SOLN
INTRAVENOUS | Status: AC
  Filled 2014-08-12: qty 1

## 2014-08-12 MED ORDER — SODIUM CHLORIDE 0.9 % IV SOLN: 250.0000 mL | INTRAVENOUS | Status: DC | PRN

## 2014-08-12 MED ORDER — OXYCODONE HCL 5 MG PO TABS: 5.0000 mg | ORAL_TABLET | Freq: Once | ORAL | Status: DC | PRN

## 2014-08-12 MED ORDER — MORPHINE SULFATE 10 MG/ML IJ SOLN: 1.0000 mg | INTRAMUSCULAR | Status: DC | PRN

## 2014-08-12 MED ORDER — PROMETHAZINE HCL 25 MG/ML IJ SOLN
INTRAMUSCULAR | Status: AC
  Filled 2014-08-12: qty 1

## 2014-08-12 MED ORDER — DEXAMETHASONE SODIUM PHOSPHATE 10 MG/ML IJ SOLN
INTRAMUSCULAR | Status: DC | PRN
  Administered 2014-08-12: 10 mg via INTRAVENOUS

## 2014-08-12 MED ORDER — ESMOLOL HCL 10 MG/ML IV SOLN
INTRAVENOUS | Status: DC | PRN
  Administered 2014-08-12: 30 mg via INTRAVENOUS

## 2014-08-12 MED ORDER — ESMOLOL HCL 10 MG/ML IV SOLN
INTRAVENOUS | Status: AC
  Filled 2014-08-12: qty 10

## 2014-08-12 MED ORDER — ACETAMINOPHEN 325 MG PO TABS: 650.0000 mg | ORAL_TABLET | ORAL | Status: DC | PRN

## 2014-08-12 MED ORDER — NEOSTIGMINE METHYLSULFATE 10 MG/10ML IV SOLN
INTRAVENOUS | Status: DC | PRN
  Administered 2014-08-12: 5 mg via INTRAVENOUS

## 2014-08-12 MED ORDER — ACETAMINOPHEN 650 MG RE SUPP
650.0000 mg | RECTAL | Status: DC | PRN
  Filled 2014-08-12: qty 1

## 2014-08-12 MED ORDER — BUPIVACAINE-EPINEPHRINE (PF) 0.25% -1:200000 IJ SOLN
INTRAMUSCULAR | Status: AC
  Filled 2014-08-12: qty 30

## 2014-08-12 MED ORDER — OXYCODONE HCL 5 MG/5ML PO SOLN
5.0000 mg | Freq: Once | ORAL | Status: DC | PRN
  Filled 2014-08-12: qty 5

## 2014-08-12 MED ORDER — SODIUM CHLORIDE 0.9 % IJ SOLN: 3.0000 mL | INTRAMUSCULAR | Status: DC | PRN

## 2014-08-12 MED ORDER — ROCURONIUM BROMIDE 100 MG/10ML IV SOLN
INTRAVENOUS | Status: DC | PRN
  Administered 2014-08-12: 50 mg via INTRAVENOUS
  Administered 2014-08-12 (×2): 10 mg via INTRAVENOUS

## 2014-08-12 MED ORDER — PROMETHAZINE HCL 25 MG/ML IJ SOLN
6.2500 mg | Freq: Once | INTRAMUSCULAR | Status: AC
  Administered 2014-08-12: 6.25 mg via INTRAVENOUS

## 2014-08-12 MED ORDER — HYDROMORPHONE HCL 1 MG/ML IJ SOLN: 0.2500 mg | INTRAMUSCULAR | Status: DC | PRN

## 2014-08-12 MED ORDER — LACTATED RINGERS IR SOLN
Status: DC | PRN
  Administered 2014-08-12: 1000 mL

## 2014-08-12 MED ORDER — PROPOFOL 10 MG/ML IV BOLUS
INTRAVENOUS | Status: AC
  Filled 2014-08-12: qty 20

## 2014-08-12 MED ORDER — GLYCOPYRROLATE 0.2 MG/ML IJ SOLN
INTRAMUSCULAR | Status: DC | PRN
  Administered 2014-08-12: .8 mg via INTRAVENOUS

## 2014-08-12 MED ORDER — OXYCODONE-ACETAMINOPHEN 5-325 MG PO TABS: 1.0000 | ORAL_TABLET | ORAL | Status: DC | PRN

## 2014-08-12 MED ORDER — MIDAZOLAM HCL 5 MG/5ML IJ SOLN
INTRAMUSCULAR | Status: DC | PRN
  Administered 2014-08-12: 2 mg via INTRAVENOUS

## 2014-08-12 MED ORDER — LACTATED RINGERS IV SOLN
INTRAVENOUS | Status: DC
  Administered 2014-08-12: 12:00:00 via INTRAVENOUS
  Administered 2014-08-12: 1000 mL via INTRAVENOUS

## 2014-08-12 SURGICAL SUPPLY — 34 items
APPLIER CLIP 5 13 M/L LIGAMAX5 (MISCELLANEOUS) ×2
APPLIER CLIP ROT 10 11.4 M/L (STAPLE)
CABLE HIGH FREQUENCY MONO STRZ (ELECTRODE) ×2 IMPLANT
CHLORAPREP W/TINT 26ML (MISCELLANEOUS) ×2 IMPLANT
CLIP APPLIE 5 13 M/L LIGAMAX5 (MISCELLANEOUS) ×1 IMPLANT
CLIP APPLIE ROT 10 11.4 M/L (STAPLE) IMPLANT
COVER MAYO STAND STRL (DRAPES) ×2 IMPLANT
DECANTER SPIKE VIAL GLASS SM (MISCELLANEOUS) ×2 IMPLANT
DEVICE TROCAR PUNCTURE CLOSURE (ENDOMECHANICALS) ×2 IMPLANT
DRAPE C-ARM 42X120 X-RAY (DRAPES) ×2 IMPLANT
DRAPE LAPAROSCOPIC ABDOMINAL (DRAPES) ×2 IMPLANT
ELECT REM PT RETURN 9FT ADLT (ELECTROSURGICAL) ×2
ELECTRODE REM PT RTRN 9FT ADLT (ELECTROSURGICAL) ×1 IMPLANT
GLOVE BIO SURGEON STRL SZ7.5 (GLOVE) ×2 IMPLANT
GLOVE BIOGEL M STRL SZ7.5 (GLOVE) IMPLANT
GLOVE BIOGEL PI IND STRL 7.0 (GLOVE) ×1 IMPLANT
GLOVE BIOGEL PI INDICATOR 7.0 (GLOVE) ×1
GOWN STRL REUS W/TWL XL LVL3 (GOWN DISPOSABLE) ×6 IMPLANT
KIT BASIN OR (CUSTOM PROCEDURE TRAY) ×2 IMPLANT
LIQUID BAND (GAUZE/BANDAGES/DRESSINGS) ×2 IMPLANT
NS IRRIG 1000ML POUR BTL (IV SOLUTION) ×2 IMPLANT
POUCH RETRIEVAL ECOSAC 10 (ENDOMECHANICALS) ×1 IMPLANT
POUCH RETRIEVAL ECOSAC 10MM (ENDOMECHANICALS) ×1
POUCH SPECIMEN RETRIEVAL 10MM (ENDOMECHANICALS) ×2 IMPLANT
SET CHOLANGIOGRAPH MIX (MISCELLANEOUS) ×2 IMPLANT
SET IRRIG TUBING LAPAROSCOPIC (IRRIGATION / IRRIGATOR) ×2 IMPLANT
SLEEVE XCEL OPT CAN 5 100 (ENDOMECHANICALS) ×4 IMPLANT
SUT MNCRL AB 4-0 PS2 18 (SUTURE) ×2 IMPLANT
TOWEL OR 17X26 10 PK STRL BLUE (TOWEL DISPOSABLE) ×2 IMPLANT
TOWEL OR NON WOVEN STRL DISP B (DISPOSABLE) ×2 IMPLANT
TRAY LAPAROSCOPIC (CUSTOM PROCEDURE TRAY) ×2 IMPLANT
TROCAR BLADELESS OPT 5 100 (ENDOMECHANICALS) ×2 IMPLANT
TROCAR XCEL BLUNT TIP 100MML (ENDOMECHANICALS) ×2 IMPLANT
TROCAR XCEL NON-BLD 11X100MML (ENDOMECHANICALS) IMPLANT

## 2014-08-12 NOTE — Discharge Instructions (Signed)
Alexis Howell, P.A. LAPAROSCOPIC SURGERY: POST OP INSTRUCTIONS Always review your discharge instruction sheet given to you by the facility where your surgery was performed. IF YOU HAVE DISABILITY OR FAMILY LEAVE FORMS, YOU MUST BRING THEM TO THE OFFICE FOR PROCESSING.   DO NOT GIVE THEM TO YOUR DOCTOR.  1. A prescription for pain medication may be given to you upon discharge.  Take your pain medication as prescribed, if needed.  If narcotic pain medicine is not needed, then you may take acetaminophen (Tylenol) or ibuprofen (Advil) as needed. 2. Take your usually prescribed medications unless otherwise directed. 3. If you need a refill on your pain medication, please contact your pharmacy.  They will contact our office to request authorization. Prescriptions will not be filled after 5pm or on week-ends. 4. You should follow a light diet the first few days after arrival home, such as soup and crackers, etc.  Be sure to include lots of fluids daily. 5. Most patients will experience some swelling and bruising in the area of the incisions.  Ice packs will help.  Swelling and bruising can take several days to resolve.  6. It is common to experience some constipation if taking pain medication after surgery.  Increasing fluid intake and taking a stool softener (such as Colace) will usually help or prevent this problem from occurring.  A mild laxative (Milk of Magnesia or Miralax) should be taken according to package instructions if there are no bowel movements after 48 hours. 7. Unless discharge instructions indicate otherwise, you may remove your bandages 24-48 hours after surgery, and you may shower at that time.  You may have steri-strips (small skin tapes) in place directly over the incision.  These strips should be left on the skin for 7-10 days.  If your surgeon used skin glue on the incision, you may shower in 24 hours.  The glue will flake off over the next 2-3 weeks.  Any sutures or  staples will be removed at the office during your follow-up visit. 8. ACTIVITIES:  You may resume regular (light) daily activities beginning the next day--such as daily self-care, walking, climbing stairs--gradually increasing activities as tolerated.  You may have sexual intercourse when it is comfortable.  Refrain from any heavy lifting or straining until approved by your doctor. a. You may drive when you are no longer taking prescription pain medication, you can comfortably wear a seatbelt, and you can safely maneuver your car and apply brakes. 9. You should see your doctor in the office for a follow-up appointment approximately 2-3 weeks after your surgery.  Make sure that you call for this appointment within a day or two after you arrive home to insure a convenient appointment time. 10. OTHER INSTRUCTIONS:DO NOT LIFT, PUSH, OR PULL ANYTHING GREATER THAN 15 POUNDS FOR 2 WEEKS  WHEN TO CALL YOUR DOCTOR: 1. Fever over 101.0 2. Inability to urinate 3. Continued bleeding from incision. 4. Increased pain, redness, or drainage from the incision. 5. Increasing abdominal pain  The clinic staff is available to answer your questions during regular business hours.  Please dont hesitate to call and ask to speak to one of the nurses for clinical concerns.  If you have a medical emergency, go to the nearest emergency room or call 911.  A surgeon from [facility] is always on call at the hospital. [address], [place], [place], [place]  [address] ? [address], [place], [place]   [address] [phone] ? [phone] ?  FAX [phone] Web site: [email or web address]     General Anesthesia, Care After Refer to this sheet in the next few weeks. These instructions provide you with information on caring for yourself after your procedure. Your health care provider may also give you more specific instructions. Your treatment has been planned according to current medical  practices, but problems sometimes occur. Call your health care provider if you have any problems or questions after your procedure. WHAT TO EXPECT AFTER THE PROCEDURE After the procedure, it is typical to experience:  Sleepiness.  Nausea and vomiting. HOME CARE INSTRUCTIONS  For the first 24 hours after general anesthesia:  Have a responsible person with you.  Do not drive a car. If you are alone, do not take public transportation.  Do not drink alcohol.  Do not take medicine that has not been prescribed by your health care provider.  Do not sign important papers or make important decisions.  You may resume a normal diet and activities as directed by your health care provider.  Change bandages (dressings) as directed.  If you have questions or problems that seem related to general anesthesia, call the hospital and ask for the anesthetist or anesthesiologist on call. SEEK MEDICAL CARE IF:  You have nausea and vomiting that continue the day after anesthesia.  You develop a rash. SEEK IMMEDIATE MEDICAL CARE IF:   You have difficulty breathing.  You have chest pain.  You have any allergic problems. Document Released: 09/12/2000 Document Revised: 06/11/2013 Document Reviewed: 12/20/2012 [facility] Patient Information 2015 [place], [place]. This information is not intended to replace advice given to you by your health care provider. Make sure you discuss any questions you have with your health care provider.

## 2014-08-12 NOTE — Op Note (Signed)
Alexis Howell [facility] [id number] [birth year] 08/12/2014  Laparoscopic Cholecystectomy with IOC Procedure Note  Indications: This patient presents with symptomatic gallbladder disease and will undergo laparoscopic cholecystectomy.  Pre-operative Diagnosis: symptomatic cholelithiasis  Post-operative Diagnosis: Calculus of gallbladder with other cholecystitis, without mention of obstruction  Surgeon: Atilano Ina   Assistants: none  Anesthesia: General endotracheal anesthesia  ASA Class: 2  Procedure Details  The patient was seen again in the Holding Room. The risks, benefits, complications, treatment options, and expected outcomes were discussed with the patient. The possibilities of reaction to medication, pulmonary aspiration, perforation of viscus, bleeding, recurrent infection, finding a normal gallbladder, the need for additional procedures, failure to diagnose a condition, the possible need to convert to an open procedure, and creating a complication requiring transfusion or operation were discussed with the patient. The likelihood of improving the patient's symptoms with return to their baseline status is good.  The patient and/or family concurred with the proposed plan, giving informed consent. The site of surgery properly noted. The patient was taken to Operating Room, identified as Alexis Howell and the procedure verified as Laparoscopic Cholecystectomy with Intraoperative Cholangiogram. A Time Out was held and the above information confirmed. Antibiotic prophylaxis was administered.   Prior to the induction of general anesthesia, antibiotic prophylaxis was administered. General endotracheal anesthesia was then administered and tolerated well. After the induction, the abdomen was prepped with Chloraprep and draped in the sterile fashion. The patient was positioned in the supine position.  Local anesthetic agent was injected into the skin near the umbilicus and an incision made.  We dissected down to the abdominal fascia with blunt dissection.  The fascia was incised vertically and we entered the peritoneal cavity bluntly.  A pursestring suture of 0-Vicryl was placed around the fascial opening.  The Hasson cannula was inserted and secured with the stay suture.  Pneumoperitoneum was then created with CO2 and tolerated well without any adverse changes in the patient's vital signs. An 5-mm port was placed in the subxiphoid position.  Two 5-mm ports were placed in the right upper quadrant. All skin incisions were infiltrated with a local anesthetic agent before making the incision and placing the trocars.   We positioned the patient in reverse Trendelenburg, tilted slightly to the patient's left.  The gallbladder was identified, the fundus grasped and retracted cephalad. Adhesions were lysed bluntly and with the electrocautery where indicated, taking care not to injure any adjacent organs or viscus. The infundibulum was grasped and retracted laterally, exposing the peritoneum overlying the triangle of Calot. This was then divided and exposed in a blunt fashion. A critical view of the cystic duct and cystic artery was obtained.  The cystic duct was clearly identified and bluntly dissected circumferentially. The cystic duct was ligated with a clip distally.   An incision was made in the cystic duct and the [facility] cholangiogram catheter introduced. The catheter was secured using a clip. A cholangiogram was then obtained which showed good visualization of the distal and proximal biliary tree with no sign of filling defects or obstruction.  Contrast flowed easily into the duodenum. The catheter was then removed.   The cystic duct was then ligated with clips and divided. The cystic artery which had been identified & issected free was ligated with clips and divided as well.   The gallbladder was dissected from the liver bed in retrograde fashion with the electrocautery. The gallbladder was removed  and placed in an Endocatch sac.  The gallbladder and Endocatch sac  were then removed through the umbilical port site. The liver bed was irrigated and inspected. Hemostasis was achieved with the electrocautery. Copious irrigation was utilized and was repeatedly aspirated until clear.  The pursestring suture was used to close the umbilical fascia.  An additional interrupted 0 vicryl suture was placed using an endoclose.   We again inspected the right upper quadrant for hemostasis.  The umbilical closure was inspected and there was no air leak and nothing trapped within the closure. Pneumoperitoneum was released as we removed the trocars.  4-0 Monocryl was used to close the skin.   Liquid band exceed was applied. The patient was then extubated and brought to the recovery room in stable condition. Instrument, sponge, and needle counts were correct at closure and at the conclusion of the case.   Findings: Chronic Cholecystitis with Cholelithiasis; slightly intrahepatic GB; somewhat fatty liver  Estimated Blood Loss: Minimal         Drains: none         Specimens: Gallbladder           Complications: None; patient tolerated the procedure well.         Disposition: PACU - hemodynamically stable.         Condition: stable  Alexis SellaEric M. Andrey CampanileWilson, MD, FACS General, Bariatric, & Minimally Invasive Surgery [facility], [place]

## 2014-08-12 NOTE — Anesthesia Preprocedure Evaluation (Addendum)
Anesthesia Evaluation  Patient identified by MRN, date of birth, ID band Patient awake    Reviewed: Allergy & Precautions, H&P , NPO status , Patient's Chart, lab work & pertinent test results  Airway Mallampati: II  TM Distance: >3 FB Neck ROM: Full    Dental no notable dental hx. (+) Teeth Intact, Dental Advisory Given   Pulmonary neg pulmonary ROS, former smoker,  breath sounds clear to auscultation  Pulmonary exam normal       Cardiovascular negative cardio ROS  Rhythm:Regular Rate:Normal     Neuro/Psych negative neurological ROS  negative psych ROS   GI/Hepatic negative GI ROS, Neg liver ROS,   Endo/Other  Morbid obesity  Renal/GU negative Renal ROS  negative genitourinary   Musculoskeletal   Abdominal   Peds  Hematology negative hematology ROS (+)   Anesthesia Other Findings   Reproductive/Obstetrics negative OB ROS                            Anesthesia Physical Anesthesia Plan  ASA: II  Anesthesia Plan: General   Post-op Pain Management:    Induction: Intravenous  Airway Management Planned: Oral ETT  Additional Equipment:   Intra-op Plan:   Post-operative Plan: Extubation in OR  Informed Consent: I have reviewed the patients History and Physical, chart, labs and discussed the procedure including the risks, benefits and alternatives for the proposed anesthesia with the patient or authorized representative who has indicated his/her understanding and acceptance.   Dental advisory given  Plan Discussed with: CRNA  Anesthesia Plan Comments:         Anesthesia Quick Evaluation

## 2014-08-12 NOTE — Transfer of Care (Signed)
Immediate Anesthesia Transfer of Care Note  Patient: Alexis Howell  Procedure(s) Performed: Procedure(s): LAPAROSCOPIC CHOLECYSTECTOMY WITH INTRAOPERATIVE CHOLANGIOGRAM (N/A)  Patient Location: PACU  Anesthesia Type:General  Level of Consciousness: awake, alert , oriented and patient cooperative  Airway & Oxygen Therapy: Patient Spontanous Breathing and Patient connected to face mask oxygen  Post-op Assessment: Report given to RN and Post -op Vital signs reviewed and stable  Post vital signs: Reviewed and stable  Last Vitals:  Filed Vitals:   08/12/14 1317  BP: 143/80  Pulse: 86  Temp:   Resp: 10    Complications: No apparent anesthesia complications

## 2014-08-12 NOTE — Anesthesia Procedure Notes (Signed)
Procedure Name: Intubation Date/Time: 08/12/2014 11:41 AM Performed by: Elyn PeersALLEN, Sheccid Lahmann J Pre-anesthesia Checklist: Patient identified, Emergency Drugs available, Suction available, Patient being monitored and Timeout performed Patient Re-evaluated:Patient Re-evaluated prior to inductionOxygen Delivery Method: Circle system utilized Preoxygenation: Pre-oxygenation with 100% oxygen Intubation Type: IV induction Ventilation: Mask ventilation without difficulty and Oral airway inserted - appropriate to patient size Laryngoscope Size: Miller and 3 Grade View: Grade I Tube type: Oral Tube size: 7.5 mm Number of attempts: 1 Airway Equipment and Method: Stylet Placement Confirmation: ETT inserted through vocal cords under direct vision,  positive ETCO2 and breath sounds checked- equal and bilateral Secured at: 22 cm Tube secured with: Tape Dental Injury: Teeth and Oropharynx as per pre-operative assessment

## 2014-08-12 NOTE — H&P (View-Only) (Signed)
Alexis Howell L[facility] 07/24/2014 1:26 PM Location: [facility] Patient #: [id number] DOB: 1981-05-13 Married / Language: English / Race: White Female  History of Present Illness Alexis Howell M. Alexis Rincon MD; 07/24/2014 2:00 PM) Patient words: gallstones.  The patient is a 34 year old female who presents for evaluation of gall stones. She is referred by Dr Milus Glazier for evaluation of gallstones and abdominal pain. She states that she has been having some stabbing right upper quadrant pain on and off for several years that was last for about 30 seconds at a time. She initially attributed to menstrual cramps. However last week she developed pain underneath her right rib cage that radiated toward her shoulder. It lasted for about 30 minutes. She states that she's been having some ongoing soreness in that area. She had another episode Saturday night. These episodes are generally accompanied with nausea. They also generally occur about half an hour after eating. She denies any fever, chills, vomiting, diarrhea or constipation. She denies melena or hematochezia. She denies acholic stools. She denies Nsaid use. She and her husband are trying to get pregnant. She underwent an abdominal ultrasound which revealed a contracted gallbladder with numerous gallstones. No evidence of wall thickness or pericholecystic fluid. Her comprehensive metabolic panel and CBC were within normal limits.   Other Problems Atilano Ina, MD; 07/24/2014 1:59 PM) No pertinent past medical history SYMPTOMATIC CHOLELITHIASIS (574.20  K80.20)  Past Surgical History Gilmer Mor, CMA; 07/24/2014 1:26 PM) Oral Surgery Tonsillectomy  Diagnostic Studies History Gilmer Mor, CMA; 07/24/2014 1:26 PM) Mammogram 1-3 years ago  Allergies Lamar Laundry Howell, CMA; 07/24/2014 1:27 PM) No Known Drug Allergies02/09/2014  Medication History Gilmer Mor, CMA; 07/24/2014 1:27 PM) No Current Medications  Social History Gilmer Mor, CMA; 07/24/2014 1:26 PM) Alcohol use Occasional alcohol use. Caffeine use Coffee. No drug use Tobacco use Former smoker.  Family History Gilmer Mor, CMA; 07/24/2014 1:26 PM) First Degree Relatives No pertinent family history  Pregnancy / Birth History Gilmer Mor, CMA; 07/24/2014 1:26 PM) Age at menarche 16 years. Contraceptive History Oral contraceptives. Gravida 0 Para 0 Regular periods  Review of Systems (Alexis Howell CMA; 07/24/2014 1:26 PM) General Present- Appetite Loss, Fatigue and Night Sweats. Not Present- Chills, Fever, Weight Gain and Weight Loss. HEENT Not Present- Earache, Hearing Loss, Hoarseness, Nose Bleed, Oral Ulcers, Ringing in the Ears, Seasonal Allergies, Sinus Pain, Sore Throat, Visual Disturbances, Wears glasses/contact lenses and Yellow Eyes. Respiratory Not Present- Bloody sputum, Chronic Cough, Difficulty Breathing, Snoring and Wheezing. Breast Not Present- Breast Mass, Breast Pain, Nipple Discharge and Skin Changes. Cardiovascular Not Present- Chest Pain, Difficulty Breathing Lying Down, Leg Cramps, Palpitations, Rapid Heart Rate, Shortness of Breath and Swelling of Extremities. Gastrointestinal Present- Abdominal Pain, Bloating, Nausea and Vomiting. Not Present- Bloody Stool, Change in Bowel Habits, Chronic diarrhea, Constipation, Difficulty Swallowing, Excessive gas, Gets full quickly at meals, Hemorrhoids, Indigestion and Rectal Pain. Female Genitourinary Not Present- Frequency, Nocturia, Painful Urination, Pelvic Pain and Urgency. Musculoskeletal Present- Back Pain. Not Present- Joint Pain, Joint Stiffness, Muscle Pain, Muscle Weakness and Swelling of Extremities. Neurological Not Present- Decreased Memory, Fainting, Headaches, Numbness, Seizures, Tingling, Tremor, Trouble walking and Weakness. Psychiatric Present- Change in Sleep Pattern. Not Present- Anxiety, Bipolar, Depression, Fearful and Frequent crying. Endocrine Not Present- Cold  Intolerance, Excessive Hunger, Hair Changes, Heat Intolerance, Hot flashes and New Diabetes. Hematology Not Present- Easy Bruising, Excessive bleeding, Gland problems, HIV and Persistent Infections.   Vitals (Alexis Howell CMA; 07/24/2014 1:27 PM) 07/24/2014 1:27 PM Weight: 274 lb  Height: 63in Body Surface Area: 2.35 m Body Mass Index: 48.54 kg/m Temp.: 97.41F(Temporal)  Pulse: 77 (Regular)  BP: 130/78 (Sitting, Left Arm, Standard)    Physical Exam Alexis Howell(Cederick Broadnax M. Zulay Corrie MD; 07/24/2014 1:55 PM) General Mental Status-Alert. General Appearance-Consistent with stated age. Hydration-Well hydrated. Voice-Normal.  Head and Neck Head-normocephalic, atraumatic with no lesions or palpable masses. Trachea-midline. Thyroid Gland Characteristics - normal size and consistency.  Eye Eyeball - Bilateral-Extraocular movements intact. Sclera/Conjunctiva - Bilateral-No scleral icterus.  Chest and Lung Exam Chest and lung exam reveals -quiet, even and easy respiratory effort with no use of accessory muscles and on auscultation, normal breath sounds, no adventitious sounds and normal vocal resonance. Inspection Chest Wall - Normal. Back - normal.  Breast - Did not examine.  Cardiovascular Cardiovascular examination reveals -normal heart sounds, regular rate and rhythm with no murmurs and normal pedal pulses bilaterally.  Abdomen Inspection Inspection of the abdomen reveals - No Hernias. Skin - Scar - no surgical scars. Palpation/Percussion Palpation and Percussion of the abdomen reveal - Soft, No Rebound tenderness, No Rigidity (guarding) and No hepatosplenomegaly. Note: mild RUQ TTP. Auscultation Auscultation of the abdomen reveals - Bowel sounds normal.  Peripheral Vascular Upper Extremity Palpation - Pulses bilaterally normal.  Neurologic Neurologic evaluation reveals -alert and oriented x 3 with no impairment of recent or remote memory. Mental  Status-Normal.  Neuropsychiatric The patient's mood and affect are described as -normal. Judgment and Insight-insight is appropriate concerning matters relevant to self.  Musculoskeletal Normal Exam - Left-Upper Extremity Strength Normal and Lower Extremity Strength Normal. Normal Exam - Right-Upper Extremity Strength Normal and Lower Extremity Strength Normal.  Lymphatic Head & Neck  General Head & Neck Lymphatics: Bilateral - Description - Normal. Axillary - Did not examine. Femoral & Inguinal - Did not examine.    Assessment & Plan Alexis Howell(Rodrigues Urbanek M. Aurilla Coulibaly MD; 07/24/2014 1:59 PM) SYMPTOMATIC CHOLELITHIASIS (574.20  K80.20) Impression: Her symptoms are pretty classic for gallbladder disease. Between now and surgery of advised her to try to refrain from getting pregnant Current Plans  I believe the patient's symptoms are consistent with gallbladder disease.  We discussed gallbladder disease. The patient was given Agricultural engineereducational material. We discussed non-operative and operative management. We discussed the signs & symptoms of acute cholecystitis  I discussed laparoscopic cholecystectomy with IOC in detail. The patient was given educational material as well as diagrams detailing the procedure. We discussed the risks and benefits of a laparoscopic cholecystectomy including, but not limited to bleeding, infection, injury to surrounding structures such as the intestine or liver, bile leak, retained gallstones, need to convert to an open procedure, prolonged diarrhea, blood clots such as DVT, common bile duct injury, anesthesia risks, and possible need for additional procedures. We discussed the typical post-operative recovery course. I explained that the likelihood of improvement of their symptoms is good.  The patient has elected to proceed with surgery. Schedule for Surgery  Mary Sellaric M. Andrey CampanileWilson, MD, FACS General, Bariatric, & Minimally Invasive Surgery [facility], [place]

## 2014-08-12 NOTE — Interval H&P Note (Signed)
History and Physical Interval Note:  08/12/2014 11:03 AM  Alexis Howell  has presented today for surgery, with the diagnosis of cholelithiasis  The various methods of treatment have been discussed with the patient and family. After consideration of risks, benefits and other options for treatment, the patient has consented to  Procedure(s): LAPAROSCOPIC CHOLECYSTECTOMY WITH INTRAOPERATIVE CHOLANGIOGRAM (N/A) as a surgical intervention .  The patient's history has been reviewed, patient examined, no change in status, stable for surgery.  I have reviewed the patient's chart and labs.  Questions were answered to the patient's satisfaction.    Follow up quantitative pregnancy test was negative.  Mary SellaEric M. Andrey CampanileWilson, MD, FACS General, Bariatric, & Minimally Invasive Surgery [facility], [place]   [facility] HospitalWILSON,[name]

## 2014-08-12 NOTE — Anesthesia Postprocedure Evaluation (Signed)
  Anesthesia Post-op Note  Patient: Alexis Howell  Procedure(s) Performed: Procedure(s): LAPAROSCOPIC CHOLECYSTECTOMY WITH INTRAOPERATIVE CHOLANGIOGRAM (N/A)  Patient Location: PACU  Anesthesia Type:General  Level of Consciousness: awake and alert   Airway and Oxygen Therapy: Patient Spontanous Breathing  Post-op Pain: none  Post-op Assessment: Post-op Vital signs reviewed, Patient's Cardiovascular Status Stable and Respiratory Function Stable  Post-op Vital Signs: Reviewed  Filed Vitals:   08/12/14 1345  BP: 126/72  Pulse: 72  Temp: 36.7 C  Resp: 22    Complications: No apparent anesthesia complications

## 2014-08-13 ENCOUNTER — Encounter (HOSPITAL_COMMUNITY): Payer: Self-pay | Admitting: General Surgery

## 2014-08-14 ENCOUNTER — Telehealth (INDEPENDENT_AMBULATORY_CARE_PROVIDER_SITE_OTHER): Payer: Self-pay | Admitting: Surgery

## 2014-08-14 NOTE — Telephone Encounter (Signed)
I spoke to her husband.  She had a lap chole by Dr. Andrey CampanileWilson on 08/12/2014.  She has become bloated.  She has tried MOM and Miralax without success.  She is having spasm, but no fever.  I suggested doing liquids only.  Limit or stop the po narcotics and use NSAIDs for pain. Walk as much as possible.  This should get better.  Call tomorrow if no better.  Ovidio Kinavid Necola Bluestein, MD, [facility] Pager: [phone] Office phone:  [phone]

## 2016-03-21 LAB — OB RESULTS CONSOLE HIV ANTIBODY (ROUTINE TESTING): HIV: NONREACTIVE

## 2016-03-21 LAB — OB RESULTS CONSOLE RPR: RPR: NONREACTIVE

## 2016-03-21 LAB — OB RESULTS CONSOLE ABO/RH: RH TYPE: NEGATIVE

## 2016-03-21 LAB — OB RESULTS CONSOLE RUBELLA ANTIBODY, IGM: RUBELLA: IMMUNE

## 2016-03-21 LAB — OB RESULTS CONSOLE HEPATITIS B SURFACE ANTIGEN: HEP B S AG: NEGATIVE

## 2016-03-21 LAB — OB RESULTS CONSOLE GC/CHLAMYDIA
Chlamydia: NEGATIVE
Gonorrhea: NEGATIVE

## 2016-05-23 ENCOUNTER — Ambulatory Visit (INDEPENDENT_AMBULATORY_CARE_PROVIDER_SITE_OTHER): Payer: Commercial Managed Care - HMO | Admitting: Family Medicine

## 2016-05-23 VITALS — BP 128/80 | HR 92 | Temp 98.6°F | Resp 17 | Ht 65.0 in | Wt 279.0 lb

## 2016-05-23 DIAGNOSIS — R05 Cough: Secondary | ICD-10-CM | POA: Diagnosis not present

## 2016-05-23 DIAGNOSIS — R059 Cough, unspecified: Secondary | ICD-10-CM

## 2016-05-23 MED ORDER — ALBUTEROL SULFATE HFA 108 (90 BASE) MCG/ACT IN AERS: 2.0000 | INHALATION_SPRAY | Freq: Four times a day (QID) | RESPIRATORY_TRACT | 2 refills | Status: DC | PRN

## 2016-05-23 NOTE — Progress Notes (Signed)
   Subjective:    Patient ID: Alexis Howell CommentShelley L Kalina, female    DOB: February 28, 1981, 35 y.o.   MRN: [id number]  Chief Complaint  Patient presents with  . URI  . Laryngitis  . patient is pregnant    PCP: Elvina SidleKurt Lauenstein, MD  HPI  This is a 35 y.o. female who is G1P0 and [redacted] weeks gestation presenting with cold. She is having trouble at night with breathing. She is having cough for 2-3 days. Non productive in nature. It is worse at night. She tried to sleep recliner last night and that didn't help. She was able to sleep last night after taking a hot shower. The cough is dry. Has been around people at work who have been sick. No history of asthma. No fevers or chills. No history of allergies. Has traveled to [place] about two weeks ago. No history of blood clots. Having some chest pain when she is coughing. Hasn't tried any OTC yet.   ROS: No unexpected weight loss, fever, chills, swelling, instability, muscle pain, numbness/tingling, redness, otherwise see HPI   Review of Systems  PMH: obese  PShx: cholecystectomy, tonsillectomy and adenoidectomy  PSx: works in catering.  FHx: sister with seizure     Objective:   Physical Exam BP 128/80 (BP Location: Right Arm, Patient Position: Sitting, Cuff Size: Normal)   Pulse 92   Temp 98.6 F (37 C) (Oral)   Resp 17   Ht 5\' 5"  (1.651 m)   Wt 279 lb (126.6 kg)   LMP 01/16/2016 (Approximate)   SpO2 98%   BMI 46.43 kg/m  Gen: NAD, alert, cooperative with exam, well-appearing HEENT: NCAT, EOMI, clear conjunctiva, oropharynx clear, supple neck, no cervical LAD, Tm's clear and intact b/l, normal turbinates, no tonsillar exudates.  CV: RRR, good S1/S2, no murmur, no edema, capillary refill brisk  Resp: CTABL, no wheezes, non-labored Abd: gravid Skin: no rashes, normal turgor  Neuro: no gross deficits.  Psych: alert and oriented      Assessment & Plan:   Cough Her lung exam is normal today. She may have some wheezing at night associated with  a viral illness. She is [redacted] weeks gestation. Her cough could be related to reflux as well.  - albuterol to take PRN.  - advised to follow up if no improvement.

## 2016-05-23 NOTE — Assessment & Plan Note (Signed)
Her lung exam is normal today. She may have some wheezing at night associated with a viral illness. She is [redacted] weeks gestation. Her cough could be related to reflux as well.  - albuterol to take PRN.  - advised to follow up if no improvement.

## 2016-05-23 NOTE — Patient Instructions (Addendum)
  Thank you for coming in,   Please use the albuterol on an as needed basis.  You can discuss the use of this medication with your OB as well.   If you don't have any improvement of your symptoms with its use then please follow up with us.    Please feel free to call with any questions or concerns at any time, at [phone]. --Dr. Jordan LikesSchmitz    IF you received an x-ray today, you will receive an invoice from [facility]. Please contact [facility] at [phone] with questions or concerns regarding your invoice.   IF you received labwork today, you will receive an invoice from [facility]. Please contact [facility] at [phone] with questions or concerns regarding your invoice.   Our billing staff will not be able to assist you with questions regarding bills from these companies.  You will be contacted with the lab results as soon as they are available. The fastest way to get your results is to activate your My Chart account. Instructions are located on the last page of this paperwork. If you have not heard from us regarding the results in 2 weeks, please contact this office.

## 2016-09-06 ENCOUNTER — Ambulatory Visit (INDEPENDENT_AMBULATORY_CARE_PROVIDER_SITE_OTHER): Payer: Self-pay | Admitting: Pediatrics

## 2016-09-06 DIAGNOSIS — Z3490 Encounter for supervision of normal pregnancy, unspecified, unspecified trimester: Secondary | ICD-10-CM

## 2016-09-06 DIAGNOSIS — Z7681 Expectant parent(s) prebirth pediatrician visit: Secondary | ICD-10-CM

## 2016-09-06 NOTE — Progress Notes (Signed)
Prenatal counseling for impending newborn done-- Z76.81  

## 2016-10-12 ENCOUNTER — Ambulatory Visit (HOSPITAL_COMMUNITY): Payer: 59

## 2016-11-26 ENCOUNTER — Inpatient Hospital Stay (HOSPITAL_COMMUNITY)
Admission: AD | Admit: 2016-11-26 | Discharge: 2016-11-28 | DRG: 765 | Disposition: A | Discharge status: Home / Self Care | Payer: 59 | Source: Ambulatory Visit

## 2016-11-26 ENCOUNTER — Inpatient Hospital Stay (HOSPITAL_COMMUNITY): Payer: 59 | Admitting: Anesthesiology

## 2016-11-26 ENCOUNTER — Encounter (HOSPITAL_COMMUNITY): Admission: AD | Disposition: A | Discharge status: Home / Self Care | Payer: Self-pay | Source: Ambulatory Visit

## 2016-11-26 DIAGNOSIS — O26893 Other specified pregnancy related conditions, third trimester: Secondary | ICD-10-CM | POA: Diagnosis present

## 2016-11-26 DIAGNOSIS — O9902 Anemia complicating childbirth: Secondary | ICD-10-CM | POA: Diagnosis present

## 2016-11-26 DIAGNOSIS — Z6841 Body Mass Index (BMI) 40.0 and over, adult: Secondary | ICD-10-CM

## 2016-11-26 DIAGNOSIS — O24420 Gestational diabetes mellitus in childbirth, diet controlled: Principal | ICD-10-CM | POA: Diagnosis present

## 2016-11-26 DIAGNOSIS — D72829 Elevated white blood cell count, unspecified: Secondary | ICD-10-CM | POA: Diagnosis present

## 2016-11-26 DIAGNOSIS — Z6791 Unspecified blood type, Rh negative: Secondary | ICD-10-CM | POA: Diagnosis not present

## 2016-11-26 DIAGNOSIS — D649 Anemia, unspecified: Secondary | ICD-10-CM | POA: Diagnosis present

## 2016-11-26 DIAGNOSIS — Z87891 Personal history of nicotine dependence: Secondary | ICD-10-CM

## 2016-11-26 DIAGNOSIS — Z3A41 41 weeks gestation of pregnancy: Secondary | ICD-10-CM

## 2016-11-26 DIAGNOSIS — O9912 Other diseases of the blood and blood-forming organs and certain disorders involving the immune mechanism complicating childbirth: Secondary | ICD-10-CM | POA: Diagnosis present

## 2016-11-26 DIAGNOSIS — O99214 Obesity complicating childbirth: Secondary | ICD-10-CM | POA: Diagnosis present

## 2016-11-26 DIAGNOSIS — O3663X0 Maternal care for excessive fetal growth, third trimester, not applicable or unspecified: Secondary | ICD-10-CM | POA: Diagnosis present

## 2016-11-26 DIAGNOSIS — Z3493 Encounter for supervision of normal pregnancy, unspecified, third trimester: Secondary | ICD-10-CM | POA: Diagnosis present

## 2016-11-26 HISTORY — DX: Other specified health status: Z78.9

## 2016-11-26 LAB — CBC
HEMATOCRIT: 40.6 % (ref 36.0–46.0)
Hemoglobin: 14.2 g/dL (ref 12.0–15.0)
MCH: 28 pg (ref 26.0–34.0)
MCHC: 35 g/dL (ref 30.0–36.0)
MCV: 80.1 fL (ref 78.0–100.0)
Platelets: 285 10*3/uL (ref 150–400)
RBC: 5.07 MIL/uL (ref 3.87–5.11)
RDW: 15.6 % — AB (ref 11.5–15.5)
WBC: 25.9 10*3/uL — AB (ref 4.0–10.5)

## 2016-11-26 LAB — TYPE AND SCREEN
ABO/RH(D): O NEG
Antibody Screen: NEGATIVE

## 2016-11-26 LAB — ABO/RH: ABO/RH(D): O NEG

## 2016-11-26 LAB — GLUCOSE, CAPILLARY: Glucose-Capillary: 101 mg/dL — ABNORMAL HIGH (ref 65–99)

## 2016-11-26 SURGERY — Surgical Case
Anesthesia: Epidural

## 2016-11-26 MED ORDER — ZOLPIDEM TARTRATE 5 MG PO TABS: 5.0000 mg | ORAL_TABLET | Freq: Every evening | ORAL | Status: DC | PRN

## 2016-11-26 MED ORDER — IBUPROFEN 600 MG PO TABS
600.0000 mg | ORAL_TABLET | Freq: Four times a day (QID) | ORAL | Status: DC
  Administered 2016-11-27 – 2016-11-28 (×7): 600 mg via ORAL
  Filled 2016-11-26 (×7): qty 1

## 2016-11-26 MED ORDER — MENTHOL 3 MG MT LOZG: 1.0000 | LOZENGE | OROMUCOSAL | Status: DC | PRN

## 2016-11-26 MED ORDER — KETOROLAC TROMETHAMINE 30 MG/ML IJ SOLN: 30.0000 mg | Freq: Four times a day (QID) | INTRAMUSCULAR | Status: AC | PRN

## 2016-11-26 MED ORDER — HYDROMORPHONE HCL 1 MG/ML IJ SOLN: 0.2500 mg | INTRAMUSCULAR | Status: DC | PRN

## 2016-11-26 MED ORDER — NALBUPHINE HCL 10 MG/ML IJ SOLN: 5.0000 mg | INTRAMUSCULAR | Status: DC | PRN

## 2016-11-26 MED ORDER — ONDANSETRON HCL 4 MG/2ML IJ SOLN: 4.0000 mg | Freq: Three times a day (TID) | INTRAMUSCULAR | Status: DC | PRN

## 2016-11-26 MED ORDER — EPHEDRINE 5 MG/ML INJ: 10.0000 mg | INTRAVENOUS | Status: DC | PRN

## 2016-11-26 MED ORDER — LIDOCAINE-EPINEPHRINE (PF) 2 %-1:200000 IJ SOLN
INTRAMUSCULAR | Status: AC
Start: 2016-11-26 — End: 2016-11-26
  Filled 2016-11-26: qty 20

## 2016-11-26 MED ORDER — OXYTOCIN 40 UNITS IN LACTATED RINGERS INFUSION - SIMPLE MED: 2.5000 [IU]/h | INTRAVENOUS | Status: DC

## 2016-11-26 MED ORDER — FENTANYL 2.5 MCG/ML BUPIVACAINE 1/10 % EPIDURAL INFUSION (WH - ANES): 14.0000 mL/h | INTRAMUSCULAR | Status: DC | PRN

## 2016-11-26 MED ORDER — ONDANSETRON HCL 4 MG/2ML IJ SOLN
INTRAMUSCULAR | Status: AC
  Filled 2016-11-26: qty 2

## 2016-11-26 MED ORDER — LACTATED RINGERS IV SOLN
INTRAVENOUS | Status: DC | PRN
  Administered 2016-11-26: 16:00:00 via INTRAVENOUS

## 2016-11-26 MED ORDER — OXYCODONE-ACETAMINOPHEN 5-325 MG PO TABS: 1.0000 | ORAL_TABLET | ORAL | Status: DC | PRN

## 2016-11-26 MED ORDER — LACTATED RINGERS IV SOLN: 500.0000 mL | Freq: Once | INTRAVENOUS | Status: DC

## 2016-11-26 MED ORDER — PROMETHAZINE HCL 25 MG/ML IJ SOLN: 6.2500 mg | INTRAMUSCULAR | Status: DC | PRN

## 2016-11-26 MED ORDER — OXYTOCIN BOLUS FROM INFUSION: 500.0000 mL | Freq: Once | INTRAVENOUS | Status: DC

## 2016-11-26 MED ORDER — SIMETHICONE 80 MG PO CHEW
80.0000 mg | CHEWABLE_TABLET | Freq: Three times a day (TID) | ORAL | Status: DC
  Administered 2016-11-27 – 2016-11-28 (×3): 80 mg via ORAL
  Filled 2016-11-26 (×3): qty 1

## 2016-11-26 MED ORDER — SOD CITRATE-CITRIC ACID 500-334 MG/5ML PO SOLN
30.0000 mL | ORAL | Status: DC | PRN
  Administered 2016-11-26: 30 mL via ORAL
  Filled 2016-11-26: qty 15

## 2016-11-26 MED ORDER — DIBUCAINE 1 % RE OINT: 1.0000 "application " | TOPICAL_OINTMENT | RECTAL | Status: DC | PRN

## 2016-11-26 MED ORDER — SODIUM BICARBONATE 8.4 % IV SOLN
INTRAVENOUS | Status: DC | PRN
  Administered 2016-11-26 (×4): 5 mL via EPIDURAL

## 2016-11-26 MED ORDER — LIDOCAINE HCL (PF) 1 % IJ SOLN
30.0000 mL | INTRAMUSCULAR | Status: DC | PRN
Start: 2016-11-26 — End: 2016-11-26

## 2016-11-26 MED ORDER — WITCH HAZEL-GLYCERIN EX PADS: 1.0000 "application " | MEDICATED_PAD | CUTANEOUS | Status: DC | PRN

## 2016-11-26 MED ORDER — SODIUM CHLORIDE 0.9% FLUSH: 3.0000 mL | INTRAVENOUS | Status: DC | PRN

## 2016-11-26 MED ORDER — LACTATED RINGERS IV SOLN
INTRAVENOUS | Status: DC
  Administered 2016-11-26 (×3): via INTRAVENOUS

## 2016-11-26 MED ORDER — OXYTOCIN 40 UNITS IN LACTATED RINGERS INFUSION - SIMPLE MED
1.0000 m[IU]/min | INTRAVENOUS | Status: DC
  Administered 2016-11-26: 2 m[IU]/min via INTRAVENOUS
  Filled 2016-11-26: qty 1000

## 2016-11-26 MED ORDER — LACTATED RINGERS IV SOLN
INTRAVENOUS | Status: DC
  Administered 2016-11-27: 999 mL via INTRAVENOUS

## 2016-11-26 MED ORDER — OXYTOCIN 10 UNIT/ML IJ SOLN: 10.0000 [IU] | Freq: Once | INTRAMUSCULAR | Status: DC

## 2016-11-26 MED ORDER — PRENATAL MULTIVITAMIN CH
1.0000 | ORAL_TABLET | Freq: Every day | ORAL | Status: DC
  Administered 2016-11-27 – 2016-11-28 (×2): 1 via ORAL
  Filled 2016-11-26 (×2): qty 1

## 2016-11-26 MED ORDER — MEPERIDINE HCL 25 MG/ML IJ SOLN: 6.2500 mg | INTRAMUSCULAR | Status: DC | PRN

## 2016-11-26 MED ORDER — SODIUM BICARBONATE 8.4 % IV SOLN
INTRAVENOUS | Status: AC
  Filled 2016-11-26: qty 50

## 2016-11-26 MED ORDER — DEXTROSE 5 % IV SOLN
3.0000 g | Freq: Once | INTRAVENOUS | Status: AC
  Administered 2016-11-26: 3 g via INTRAVENOUS
  Filled 2016-11-26: qty 3

## 2016-11-26 MED ORDER — SCOPOLAMINE 1 MG/3DAYS TD PT72: 1.0000 | MEDICATED_PATCH | Freq: Once | TRANSDERMAL | Status: DC

## 2016-11-26 MED ORDER — SCOPOLAMINE 1 MG/3DAYS TD PT72
MEDICATED_PATCH | TRANSDERMAL | Status: DC | PRN
  Administered 2016-11-26: 1 via TRANSDERMAL

## 2016-11-26 MED ORDER — OXYTOCIN 10 UNIT/ML IJ SOLN
INTRAVENOUS | Status: DC | PRN
  Administered 2016-11-26: 40 [IU] via INTRAVENOUS

## 2016-11-26 MED ORDER — ACETAMINOPHEN 325 MG PO TABS: 650.0000 mg | ORAL_TABLET | ORAL | Status: DC | PRN

## 2016-11-26 MED ORDER — MORPHINE SULFATE (PF) 0.5 MG/ML IJ SOLN
INTRAMUSCULAR | Status: AC
  Filled 2016-11-26: qty 10

## 2016-11-26 MED ORDER — NALBUPHINE HCL 10 MG/ML IJ SOLN: 5.0000 mg | Freq: Once | INTRAMUSCULAR | Status: DC | PRN

## 2016-11-26 MED ORDER — MORPHINE SULFATE (PF) 0.5 MG/ML IJ SOLN
INTRAMUSCULAR | Status: DC | PRN
  Administered 2016-11-26: 4 mg via EPIDURAL

## 2016-11-26 MED ORDER — SIMETHICONE 80 MG PO CHEW
80.0000 mg | CHEWABLE_TABLET | ORAL | Status: DC
  Administered 2016-11-27 – 2016-11-28 (×2): 80 mg via ORAL
  Filled 2016-11-26 (×2): qty 1

## 2016-11-26 MED ORDER — SCOPOLAMINE 1 MG/3DAYS TD PT72
MEDICATED_PATCH | TRANSDERMAL | Status: AC
Start: 2016-11-26 — End: 2016-11-26
  Filled 2016-11-26: qty 1

## 2016-11-26 MED ORDER — TETANUS-DIPHTH-ACELL PERTUSSIS 5-2.5-18.5 LF-MCG/0.5 IM SUSP: 0.5000 mL | Freq: Once | INTRAMUSCULAR | Status: DC

## 2016-11-26 MED ORDER — SENNOSIDES-DOCUSATE SODIUM 8.6-50 MG PO TABS
2.0000 | ORAL_TABLET | ORAL | Status: DC
  Administered 2016-11-27 – 2016-11-28 (×2): 2 via ORAL
  Filled 2016-11-26 (×2): qty 2

## 2016-11-26 MED ORDER — NALOXONE HCL 2 MG/2ML IJ SOSY
1.0000 ug/kg/h | PREFILLED_SYRINGE | INTRAVENOUS | Status: DC | PRN
  Filled 2016-11-26: qty 2

## 2016-11-26 MED ORDER — DIPHENHYDRAMINE HCL 50 MG/ML IJ SOLN: 12.5000 mg | INTRAMUSCULAR | Status: DC | PRN

## 2016-11-26 MED ORDER — NALOXONE HCL 0.4 MG/ML IJ SOLN: 0.4000 mg | INTRAMUSCULAR | Status: DC | PRN

## 2016-11-26 MED ORDER — PHENYLEPHRINE 40 MCG/ML (10ML) SYRINGE FOR IV PUSH (FOR BLOOD PRESSURE SUPPORT)
80.0000 ug | PREFILLED_SYRINGE | INTRAVENOUS | Status: DC | PRN
  Filled 2016-11-26: qty 10

## 2016-11-26 MED ORDER — CEFAZOLIN (ANCEF) 1 G IV SOLR: 2.0000 g | INTRAVENOUS | Status: DC

## 2016-11-26 MED ORDER — LACTATED RINGERS IV SOLN
INTRAVENOUS | Status: DC | PRN
  Administered 2016-11-26 (×2): via INTRAVENOUS

## 2016-11-26 MED ORDER — PHENYLEPHRINE 40 MCG/ML (10ML) SYRINGE FOR IV PUSH (FOR BLOOD PRESSURE SUPPORT): 80.0000 ug | PREFILLED_SYRINGE | INTRAVENOUS | Status: DC | PRN

## 2016-11-26 MED ORDER — OXYTOCIN 40 UNITS IN LACTATED RINGERS INFUSION - SIMPLE MED: 2.5000 [IU]/h | INTRAVENOUS | Status: AC

## 2016-11-26 MED ORDER — DIPHENHYDRAMINE HCL 25 MG PO CAPS: 25.0000 mg | ORAL_CAPSULE | Freq: Four times a day (QID) | ORAL | Status: DC | PRN

## 2016-11-26 MED ORDER — OXYTOCIN 10 UNIT/ML IJ SOLN
INTRAMUSCULAR | Status: AC
  Filled 2016-11-26: qty 4

## 2016-11-26 MED ORDER — SODIUM CHLORIDE 0.9 % IV SOLN
25.0000 mg | Freq: Once | INTRAVENOUS | Status: AC
  Administered 2016-11-26: 25 mg via INTRAVENOUS
  Filled 2016-11-26: qty 0.5

## 2016-11-26 MED ORDER — ONDANSETRON HCL 4 MG/2ML IJ SOLN: 4.0000 mg | Freq: Four times a day (QID) | INTRAMUSCULAR | Status: DC | PRN

## 2016-11-26 MED ORDER — LIDOCAINE HCL (PF) 1 % IJ SOLN
INTRAMUSCULAR | Status: DC | PRN
  Administered 2016-11-26 (×2): 4 mL

## 2016-11-26 MED ORDER — ACETAMINOPHEN 325 MG PO TABS
650.0000 mg | ORAL_TABLET | ORAL | Status: DC | PRN
  Administered 2016-11-26: 650 mg via ORAL
  Filled 2016-11-26: qty 2

## 2016-11-26 MED ORDER — SCOPOLAMINE 1 MG/3DAYS TD PT72
MEDICATED_PATCH | TRANSDERMAL | Status: AC
  Filled 2016-11-26: qty 1

## 2016-11-26 MED ORDER — SIMETHICONE 80 MG PO CHEW: 80.0000 mg | CHEWABLE_TABLET | ORAL | Status: DC | PRN

## 2016-11-26 MED ORDER — COCONUT OIL OIL: 1.0000 "application " | TOPICAL_OIL | Status: DC | PRN

## 2016-11-26 MED ORDER — KETOROLAC TROMETHAMINE 30 MG/ML IJ SOLN: 30.0000 mg | Freq: Once | INTRAMUSCULAR | Status: DC | PRN

## 2016-11-26 MED ORDER — TERBUTALINE SULFATE 1 MG/ML IJ SOLN: 0.2500 mg | Freq: Once | INTRAMUSCULAR | Status: DC | PRN

## 2016-11-26 MED ORDER — ONDANSETRON HCL 4 MG/2ML IJ SOLN
INTRAMUSCULAR | Status: DC | PRN
  Administered 2016-11-26: 4 mg via INTRAVENOUS

## 2016-11-26 MED ORDER — DIPHENHYDRAMINE HCL 25 MG PO CAPS
25.0000 mg | ORAL_CAPSULE | ORAL | Status: DC | PRN
  Filled 2016-11-26: qty 1

## 2016-11-26 MED ORDER — FENTANYL 2.5 MCG/ML BUPIVACAINE 1/10 % EPIDURAL INFUSION (WH - ANES)
14.0000 mL/h | INTRAMUSCULAR | Status: DC | PRN
  Administered 2016-11-26 (×2): 14 mL/h via EPIDURAL
  Filled 2016-11-26 (×2): qty 100

## 2016-11-26 MED ORDER — LACTATED RINGERS IV SOLN
500.0000 mL | INTRAVENOUS | Status: DC | PRN
  Administered 2016-11-26: 1000 mL via INTRAVENOUS

## 2016-11-26 MED ORDER — OXYCODONE-ACETAMINOPHEN 5-325 MG PO TABS: 2.0000 | ORAL_TABLET | ORAL | Status: DC | PRN

## 2016-11-26 SURGICAL SUPPLY — 33 items
BENZOIN TINCTURE PRP APPL 2/3 (GAUZE/BANDAGES/DRESSINGS) ×2 IMPLANT
CHLORAPREP W/TINT 26ML (MISCELLANEOUS) ×2 IMPLANT
CLAMP CORD UMBIL (MISCELLANEOUS) IMPLANT
CLOTH BEACON ORANGE TIMEOUT ST (SAFETY) ×2 IMPLANT
CLSR STERI-STRIP ANTIMIC 1/2X4 (GAUZE/BANDAGES/DRESSINGS) ×2 IMPLANT
CONTAINER PREFILL 10% NBF 15ML (MISCELLANEOUS) IMPLANT
DRSG OPSITE POSTOP 4X10 (GAUZE/BANDAGES/DRESSINGS) ×2 IMPLANT
ELECT REM PT RETURN 9FT ADLT (ELECTROSURGICAL) ×2
ELECTRODE REM PT RTRN 9FT ADLT (ELECTROSURGICAL) ×1 IMPLANT
EXTRACTOR VACUUM M CUP 4 TUBE (SUCTIONS) IMPLANT
GLOVE BIO SURGEON STRL SZ 6.5 (GLOVE) ×2 IMPLANT
GLOVE BIOGEL PI IND STRL 7.0 (GLOVE) ×2 IMPLANT
GLOVE BIOGEL PI INDICATOR 7.0 (GLOVE) ×2
GOWN STRL REUS W/TWL LRG LVL3 (GOWN DISPOSABLE) ×4 IMPLANT
KIT ABG SYR 3ML LUER SLIP (SYRINGE) IMPLANT
NEEDLE HYPO 22GX1.5 SAFETY (NEEDLE) IMPLANT
NEEDLE HYPO 25X5/8 SAFETYGLIDE (NEEDLE) IMPLANT
NS IRRIG 1000ML POUR BTL (IV SOLUTION) ×2 IMPLANT
PACK C SECTION WH (CUSTOM PROCEDURE TRAY) ×2 IMPLANT
PAD OB MATERNITY 4.3X12.25 (PERSONAL CARE ITEMS) ×2 IMPLANT
PENCIL SMOKE EVAC W/HOLSTER (ELECTROSURGICAL) ×2 IMPLANT
STRIP CLOSURE SKIN 1/2X4 (GAUZE/BANDAGES/DRESSINGS) IMPLANT
SUT MON AB 4-0 PS1 27 (SUTURE) ×2 IMPLANT
SUT PLAIN 0 NONE (SUTURE) IMPLANT
SUT PLAIN 2 0 XLH (SUTURE) IMPLANT
SUT VIC AB 0 CT1 36 (SUTURE) ×4 IMPLANT
SUT VIC AB 0 CTX 36 (SUTURE) ×2
SUT VIC AB 0 CTX36XBRD ANBCTRL (SUTURE) ×2 IMPLANT
SUT VIC AB 2-0 CT1 27 (SUTURE) ×2
SUT VIC AB 2-0 CT1 TAPERPNT 27 (SUTURE) ×2 IMPLANT
SYR CONTROL 10ML LL (SYRINGE) IMPLANT
TOWEL OR 17X24 6PK STRL BLUE (TOWEL DISPOSABLE) ×2 IMPLANT
TRAY FOLEY BAG SILVER LF 14FR (SET/KITS/TRAYS/PACK) IMPLANT

## 2016-11-26 NOTE — Anesthesia Pain Management Evaluation Note (Signed)
  CR[facility] Pain Management Visit Note  Patient: Ria CommentShelley L Hanger, 36 y.o., female  "Hello I am a member of the anesthesia team at [facility]. We have an anesthesia team available at all times to provide care throughout the hospital, including epidural management and anesthesia for C-section. I don't know your plan for the delivery whether it a natural birth, water birth, IV sedation, nitrous supplementation, doula or epidural, but we want to meet your pain goals."   1.Was your pain managed to your expectations on prior hospitalizations?   No prior hospitalizations  2.What is your expectation for pain management during this hospitalization?     Epidural  3.How can we help you reach that goal? Pt is requesting epidural now. ANMD has been notified.  Record the patient's initial score and the patient's pain goal.   Pain: 10  Pain Goal: 10 [facility] wants you to be able to say your pain was always managed very well.  Othello Sgroi 11/26/2016

## 2016-11-26 NOTE — Anesthesia Preprocedure Evaluation (Signed)
Anesthesia Evaluation  Patient identified by MRN, date of birth, ID band Patient awake    Reviewed: Allergy & Precautions, H&P , NPO status , Patient's Chart, lab work & pertinent test results  Airway Mallampati: II  TM Distance: >3 FB Neck ROM: Full    Dental no notable dental hx. (+) Teeth Intact, Dental Advisory Given   Pulmonary former smoker,    Pulmonary exam normal breath sounds clear to auscultation       Cardiovascular negative cardio ROS   Rhythm:Regular Rate:Normal     Neuro/Psych negative neurological ROS  negative psych ROS   GI/Hepatic negative GI ROS, Neg liver ROS,   Endo/Other  Morbid obesity  Renal/GU negative Renal ROS  negative genitourinary   Musculoskeletal   Abdominal (+) + obese,   Peds  Hematology negative hematology ROS (+)   Anesthesia Other Findings   Reproductive/Obstetrics (+) Pregnancy                             Anesthesia Physical  Anesthesia Plan  ASA: III  Anesthesia Plan: Epidural   Post-op Pain Management:    Induction:   PONV Risk Score and Plan:   Airway Management Planned:   Additional Equipment:   Intra-op Plan:   Post-operative Plan:   Informed Consent: I have reviewed the patients History and Physical, chart, labs and discussed the procedure including the risks, benefits and alternatives for the proposed anesthesia with the patient or authorized representative who has indicated his/her understanding and acceptance.     Plan Discussed with:   Anesthesia Plan Comments:         Anesthesia Quick Evaluation

## 2016-11-26 NOTE — H&P (Signed)
OB ADMISSION/ HISTORY & PHYSICAL:  Admission Date: 11/26/2016  7:02 AM  Admit Diagnosis: Labor latency  Alexis Howell is a 36 y.o. female presenting for management of prolonged active phase of labor, [facility].  Prenatal History: G1P0   EDC : 11/14/16 [facility] since [redacted] wks gestation, prior to this [facility]   Prenatal course complicated by GDM A1, obesity, Rh negative  Prenatal Labs: ABO, Rh:   O neg Antibody:  neg Rubella:   immune RPR:   NR HBsAg:   Neg HIV:   Neg GBS:   neg 2 hr Glucola : abnormal  Anatomy US normal female, R outflow track poorly visualized, posterior fundal placenta Growth sono q 4-6 wks, last one at 40+ wks with EFW ~ 90% Ultrascreen negative  BMI 48 at beginning of care, TWG 17 lbs  Medical / Surgical History :  Past medical history: obesity   Past surgical history:  Past Surgical History:  Procedure Laterality Date  . CHOLECYSTECTOMY N/A 08/12/2014   Procedure: LAPAROSCOPIC CHOLECYSTECTOMY WITH INTRAOPERATIVE CHOLANGIOGRAM;  Surgeon: Atilano InaEric M Wilson, MD;  Location: [facility]S;  Service: General;  Laterality: N/A;  . TONSILLECTOMY AND ADENOIDECTOMY    . WISDOM TOOTH EXTRACTION       Family History:  Family History  Problem Relation Age of Onset  . Heart disease Mother   . Seizures Sister   . Mental illness Paternal Grandmother      Social History:  reports that she quit smoking about 7 years ago. She has never used smokeless tobacco. She reports that she drinks alcohol. She reports that she does not use drugs.   Allergies: Patient has no known allergies.    Current Medications at time of admission:  Prescriptions Prior to Admission  Medication Sig Dispense Refill Last Dose  . albuterol (PROVENTIL HFA;VENTOLIN HFA) 108 (90 Base) MCG/ACT inhaler Inhale 2 puffs into the lungs every 6 (six) hours as needed for wheezing or shortness of breath. 1  Inhaler 2   . Prenatal Vit-Fe Fumarate-FA (PRENATAL MULTIVITAMIN) TABS tablet Take 1 tablet by mouth daily at 12 noon.   Taking      Review of Systems: ROS Painful ctx, + back pain, + urge to push w/ some ctx. Clear AF.  Denies HA/RUQ pain, visual changes + nausea and emesis x 2  Physical Exam:  Dilation: 5.5 Effacement (%): 70 Exam by:: Colon Flattery. Paul, CNM Vitals:   11/26/16 0915 11/26/16 0920 11/26/16 0930 11/26/16 0940  BP: 140/89 (!) 140/98 131/90 133/87  Pulse: (!) 108 (!) 112 (!) 106 (!) 132  Resp:      Temp: 98.4 F (36.9 C)     TempSrc: Oral     SpO2:      Weight:      Height:        General: AAO x 3, breathing and working w/ ctx Heart: RRR Lungs:CTAB Abdomen:gravid, obese Extremities:no edema Genitalia / VE: 5/70/-1 [facility] with increased cervical edema noted in the hour prior to transfer FHR: 130, mod var, + accels, no decels TOCO: q 2-3 min, palp. Moderate.  Labs:    No results for input(s): WBC, HGB, HCT, PLT in the last 72 hours.   Assessment:  36 y.o. G1P0 at 2947w5d  Protracted active phase of labor - CPD vs malposition with LGA fetus FHT category 1 Epidural for pain control and relaxation t facilitate fetal rotation and descent  IUPC and Pitocin augmentation, titrate Pit MVU >/= 200 GDM A1 well controlled w/ diet Elevated BP, improved since pain controled, no s/s of Hx of PEC Follow closely Leukocytosis, afebrile and GBS neg, monitor for developing chorio    Consultant: Dr. Darien Ramus, CNM, MSN 11/26/2016, 7:32 AM

## 2016-11-26 NOTE — Anesthesia Procedure Notes (Signed)
Epidural Patient location during procedure: OB  Staffing Anesthesiologist: Cheynne Virden Performed: anesthesiologist   Preanesthetic Checklist Completed: patient identified, pre-op evaluation, timeout performed, IV checked, risks and benefits discussed and monitors and equipment checked  Epidural Patient position: sitting Prep: site prepped and draped and DuraPrep Patient monitoring: heart rate, continuous pulse ox and blood pressure Approach: midline Location: L3-L4 Injection technique: LOR air and LOR saline  Needle:  Needle type: Tuohy  Needle gauge: 17 G Needle length: 9 cm Needle insertion depth: 7 cm Catheter type: closed end flexible Catheter size: 19 Gauge Catheter at skin depth: 13 cm Test dose: negative  Assessment Sensory level: T8 Events: blood not aspirated, injection not painful, no injection resistance, negative IV test and no paresthesia  Additional Notes Reason for block:procedure for pain     

## 2016-11-26 NOTE — Progress Notes (Signed)
S: Doing well, pain well controlled with  Epidural, no pelvic pressure, has had a good nap after Benadryl IV.    O: Vitals:   11/26/16 1200 11/26/16 1231 11/26/16 1300 11/26/16 1330  BP: 115/65 (!) 99/59 116/70 112/68  Pulse: 93 88 95 84  Resp: 18 20 18 20   Temp:      TempSrc:      SpO2:      Weight:      Height:       Lab Results  Component Value Date   WBC 25.9 (H) 11/26/2016   HGB 14.2 11/26/2016   HCT 40.6 11/26/2016   MCV 80.1 11/26/2016   PLT 285 11/26/2016    FHT:  FHR: 120 bpm, variability: minimal to moderate,  accelerations:  Present,  decelerations:  Absent UC:   q 2-3 min, MVU > 200 for past 4 hours SVE:   Dilation: 6 Effacement (%): 80, swelling progressing, + caput, clear AF Station: -1 Exam by:: Colon Flattery. Paul, CNM   A / P: Arrest in active phase of labor GDM A1 Leukocytosis Fetal Wellbeing:  Category I Pain Control:  Epidural  Discussed options for continued labor vs 1LTCS. Not hopeful for vaginal delivery given adequate ctx for > 2 hrs with no change in dilation and cervical swelling. Suspect CPD. Patient agrees to cesarean.  Plan CBG 1 hour prior to surgery. Dr. Algie CofferFogelman notified. Will prep for surgery.   Neta Mendsaniela C Paul, CNM, MSN 11/26/2016, 2:35 PM

## 2016-11-26 NOTE — Transfer of Care (Signed)
Immediate Anesthesia Transfer of Care Note  Patient: Alexis Howell CommentShelley L Pieri  Procedure(s) Performed: Procedure(s): CESAREAN SECTION (N/A)  Patient Location: PACU  Anesthesia Type:Epidural  Level of Consciousness: awake  Airway & Oxygen Therapy: Patient Spontanous Breathing  Post-op Assessment: Report given to RN and Post -op Vital signs reviewed and stable  Post vital signs: stable  Last Vitals:  Vitals:   11/26/16 1502 11/26/16 1530  BP: (!) 152/86 129/82  Pulse: (!) 119 (!) 116  Resp: 20 20  Temp:      Last Pain:  Vitals:   11/26/16 1500  TempSrc: Axillary  PainSc:          Complications: No apparent anesthesia complications

## 2016-11-26 NOTE — Op Note (Signed)
11/26/2016  5:22 PM  PATIENT:  Alexis Howell  36 y.o. female  PRE-OPERATIVE DIAGNOSIS:  Failure to Progress, A1GDM  POST-OPERATIVE DIAGNOSIS:  Failure to Progress  PROCEDURE:  Procedure(s): CESAREAN SECTION (N/Howell) Primary LTCS with 2 layer closure  SURGEON:  Surgeon(s) and Role:    * Noland Fordyce, MD - Primary  PHYSICIAN ASSISTANT:   ASSISTANTSRenae Fickle, CNM   ANESTHESIA:   epidural  EBL:  Total I/O In: 2500 [I.V.:2500] Out: 900 [Urine:200; Blood:700]  BLOOD ADMINISTERED:none  DRAINS: Urinary Catheter (Foley)   LOCAL MEDICATIONS USED:  NONE  SPECIMEN:  Source of Specimen:  placenta  DISPOSITION OF SPECIMEN:  L&D  COUNTS:  YES  TOURNIQUET:  * No tourniquets in log *  DICTATION: .Note written in EPIC  PLAN OF CARE: Admit to inpatient   PATIENT DISPOSITION:  PACU - hemodynamically stable.   Delay start of Pharmacological VTE agent (>24hrs) due to surgical blood loss or risk of bleeding: yes     Findings:  @BABYSEXEBC @ infant,  APGAR (1 MIN): 8   APGAR (5 MINS): 9   APGAR (10 MINS):   Normal uterus, tubes and ovaries, normal placenta. 3VC, clear amniotic fluid, female, r paratubal cyst  EBL: per anasthesia cc Antibiotics:  3g Ancef Complications: none  Indications: This is Howell 36 y.o. year-old, G1  At [redacted]w[redacted]d admitted for augmentation of labor after lack of cervical change overnight at the birthing center. She continues to have arrest of dilation after pitocin and epidural.. Risks benefits and alternatives of the procedure were discussed with the patient who agreed to proceed  Procedure:  After informed consent was obtained the patient was taken to the operating room where epidural anesthesia was found to be adequate.  She was prepped and draped in the normal sterile fashion in dorsal supine position with Howell leftward tilt.  Howell foley catheter was in place.  Howell Pfannenstiel skin incision was made 2 cm above the pubic symphysis in the midline with the scalpel.   Dissection was carried down with the Bovie cautery until the fascia was reached. The fascia was incised in the midline. The incision was extended laterally with the Mayo scissors. The inferior aspect of the fascial incision was grasped with the Coker clamps, elevated up and the underlying rectus muscles were dissected off sharply. The superior aspect of the fascial incision was grasped with the Coker clamps elevated up and the underlying rectus muscles were dissected off sharply.  The peritoneum was entered bluntly. The peritoneal incision was extended superiorly and inferiorly with good visualization of the bladder. The bladder blade was inserted and palpation was done to assess the fetal position and the location of the uterine vessels. The lower segment of the uterus was incised sharply with the scalpel and extended  bluntly in the cephalo-caudal fashion. The occiput was deep in the pelvis and the RN elevated the head vaginally. The infant was grasped, brought to the incision,  rotated and the infant was delivered with fundal pressure. The nose and mouth were bulb suctioned. The cord was clamped and cut after 1 minute delay. The infant was handed off to the waiting pediatrician. The placenta was expressed. The uterus was exteriorized. The uterus was cleared of all clots and debris. The uterine incision was repaired with 0 Vicryl in Howell running locked fashion.  Howell second layer of the same suture was used in an imbricating fashion to obtain excellent hemostasis.  The uterus was then returned to the abdomen, the gutters were cleared  of all clots and debris. The uterine incision was reinspected and found to be hemostatic. The peritoneum was grasped and closed with 2-0 Vicryl in Howell running fashion. The cut muscle edges and the underside of the fascia were inspected and found to be hemostatic. The fascia was closed with 0 Vicryl in 2 layers.. The subcutaneous tissue was irrigated. Scarpa's layer was closed with Howell 2-0 plain  gut suture. The skin was closed with Howell 4-0 Monocryl in Howell single layer. The patient tolerated the procedure well. Sponge lap and needle counts were correct x3 and patient was taken to the recovery room in Howell stable condition.  Alexis Howell. 11/26/2016 5:24 PM

## 2016-11-26 NOTE — Brief Op Note (Signed)
11/26/2016  5:22 PM  PATIENT:  Alexis Howell  36 y.o. female  PRE-OPERATIVE DIAGNOSIS:  Failure to Progress, A1GDM  POST-OPERATIVE DIAGNOSIS:  Failure to Progress  PROCEDURE:  Procedure(s): CESAREAN SECTION (N/A) Primary LTCS with 2 layer closure  SURGEON:  Surgeon(s) and Role:    * Noland FordyceFogleman, Tyasia Packard, MD - Primary  PHYSICIAN ASSISTANT:   ASSISTANTSRenae Fickle: Paul, CNM   ANESTHESIA:   epidural  EBL:  Total I/O In: 2500 [I.V.:2500] Out: 900 [Urine:200; Blood:700]  BLOOD ADMINISTERED:none  DRAINS: Urinary Catheter (Foley)   LOCAL MEDICATIONS USED:  NONE  SPECIMEN:  Source of Specimen:  placenta  DISPOSITION OF SPECIMEN:  L&D  COUNTS:  YES  TOURNIQUET:  * No tourniquets in log *  DICTATION: .Note written in EPIC  PLAN OF CARE: Admit to inpatient   PATIENT DISPOSITION:  PACU - hemodynamically stable.   Delay start of Pharmacological VTE agent (>24hrs) due to surgical blood loss or risk of bleeding: yes

## 2016-11-27 ENCOUNTER — Encounter (HOSPITAL_COMMUNITY): Payer: Self-pay | Admitting: Obstetrics

## 2016-11-27 LAB — CBC
HEMATOCRIT: 33.2 % — AB (ref 36.0–46.0)
HEMOGLOBIN: 10.9 g/dL — AB (ref 12.0–15.0)
MCH: 27.9 pg (ref 26.0–34.0)
MCHC: 33.4 g/dL (ref 30.0–36.0)
MCV: 83.4 fL (ref 78.0–100.0)
Platelets: 233 10*3/uL (ref 150–400)
RBC: 3.98 MIL/uL (ref 3.87–5.11)
RDW: 16.3 % — AB (ref 11.5–15.5)
WBC: 19.9 10*3/uL — ABNORMAL HIGH (ref 4.0–10.5)

## 2016-11-27 LAB — RPR: RPR Ser Ql: NONREACTIVE

## 2016-11-27 MED ORDER — RHO D IMMUNE GLOBULIN 1500 UNIT/2ML IJ SOSY
300.0000 ug | PREFILLED_SYRINGE | Freq: Once | INTRAMUSCULAR | Status: AC
  Administered 2016-11-27: 300 ug via INTRAVENOUS
  Filled 2016-11-27: qty 2

## 2016-11-27 NOTE — Progress Notes (Signed)
Went over [employer]aching materials with parents, including crib contents, not sleeping with baby, how to use bulb syringe in event baby was choking, placing vaseline on baby's bottom, how many pees/poops to expect.  Parents voiced understanding on all subjects covered.  Jtwells, rn

## 2016-11-27 NOTE — Anesthesia Postprocedure Evaluation (Signed)
Anesthesia Post Note  Patient: Alexis Howell  Procedure(s) Performed: Procedure(s) (LRB): CESAREAN SECTION (N/A)     Patient location during evaluation: Mother Baby Anesthesia Type: Epidural Level of consciousness: awake and alert and oriented Pain management: pain level controlled Vital Signs Assessment: post-procedure vital signs reviewed and stable Respiratory status: spontaneous breathing and nonlabored ventilation Cardiovascular status: stable Postop Assessment: no headache, patient able to bend at knees, no backache, no signs of nausea or vomiting, epidural receding and adequate PO intake Anesthetic complications: no    Last Vitals:  Vitals:   11/27/16 0145 11/27/16 0619  BP: 97/65 (!) 90/51  Pulse: 82 82  Resp: 16 16  Temp: 36.8 C 36.8 C    Last Pain:  Vitals:   11/27/16 0610  TempSrc:   PainSc: 2    Pain Goal:                 Laban EmperorMalinova,Deannah Rossi Hristova

## 2016-11-27 NOTE — Progress Notes (Signed)
Subjective: POD# 1 Information for the patient's newborn:  [place] Boy Gelena [[id number]][[id number]]  female   circ planned outpatient Baby name: Enid Derrythan  Reports feeling very well, up out of bed today and ambulating in room. Feeding: breast, improving latch Patient reports tolerating PO.  Breast symptoms: + colostrum Pain controlled with PO meds Denies HA/SOB/C/P/N/V/dizziness. Flatus absent. She reports vaginal bleeding as normal, without clots.  She is ambulating, urinating without difficulty.   Notes some stinging at urethra last couple of voids.   Objective:   VS:    Vitals:   11/26/16 2200 11/27/16 0145 11/27/16 0619 11/27/16 1047  BP:  97/65 (!) 90/51 118/69  Pulse: 88 82 82 85  Resp: 16 16 16 17   Temp: 98.5 F (36.9 C) 98.3 F (36.8 C) 98.2 F (36.8 C) 98 F (36.7 C)  TempSrc: Oral     SpO2: 95% 98% 97% 98%  Weight:      Height:         Intake/Output Summary (Last 24 hours) at 11/27/16 1122 Last data filed at 11/27/16 1047  Gross per 24 hour  Intake             4403 ml  Output             7125 ml  Net            -2722 ml        Recent Labs  11/26/16 0730 11/27/16 0501  WBC 25.9* 19.9*  HGB 14.2 10.9*  HCT 40.6 33.2*  PLT 285 233     Blood type: --/--/O NEG (06/10 0501) Infant A pos  Rubella: Immune (10/02 0000)     Physical Exam:  General: alert CV: Regular rate and rhythm Resp: clear Abdomen: soft, nontender, hypotonic bowel sounds Incision: clean, dry and intact Uterine Fundus: firm, below umbilicus, nontender Lochia: decreased Ext: trace edema, redness or tenderness in the calves or thighs      Assessment/Plan: 36 y.o.   POD# 1. G1P1001                  Principal Problem:   Postpartum care following cesarean delivery 6/9 Active Problems:   Prolonged first stage (of labor)   Delivery of pregnancy by cesarean section: Indication: arrest of dilation RH neg mom w/ Rh pos baby  - Rhophylac given  Doing well, stable.               Advance  diet as tolerated, warm fluids to increase gut motility Encourage rest when baby rests Breastfeeding support Encourage to ambulate Routine post-op care  Neta Mendsaniela C Paul, CNM, MSN 11/27/2016, 11:22 AM

## 2016-11-27 NOTE — Progress Notes (Addendum)
Pt has ambulated in hall and [facility]. Sherald BargeMatthews, Gaius Ishaq L

## 2016-11-27 NOTE — Addendum Note (Signed)
Addendum  created 11/27/16 0825 by Elgie CongoMalinova, Oluwadamilola Rosamond H, CRNA   Sign clinical note

## 2016-11-27 NOTE — Anesthesia Postprocedure Evaluation (Signed)
Anesthesia Post Note  Patient: Alexis Howell  Procedure(s) Performed: Procedure(s) (LRB): CESAREAN SECTION (N/A)     Patient location during evaluation: PACU Anesthesia Type: Epidural Level of consciousness: awake and alert Pain management: pain level controlled Vital Signs Assessment: post-procedure vital signs reviewed and stable Respiratory status: spontaneous breathing, nonlabored ventilation and respiratory function stable Cardiovascular status: stable Postop Assessment: no headache, no backache and epidural receding Anesthetic complications: no    Last Vitals:  Vitals:   11/26/16 2200 11/27/16 0145  BP:  97/65  Pulse: 88 82  Resp: 16 16  Temp: 36.9 C 36.8 C    Last Pain:  Vitals:   11/27/16 0145  TempSrc:   PainSc: 3    Pain Goal:                 Alexis Howell

## 2016-11-27 NOTE — Lactation Note (Signed)
This note was copied from a baby's chart. Lactation Consultation Note New mom has pendulum breast w/flat nipples at the end of breast. Very compressible areolas and nipples. Colostrum expressed easily.  Rolled wash cloth under breast for support. Large baby, has heavy head, mom c/sec. Encouraged football. Mom having some difficulty latching and positioning.  Encouraged mom to pre-pump before latching to evert nipples as well as finger stimulation. Gave shells to wear during the day when up and about.  Baby finally latched BF for 15 min. Baby was able to obtain a deep latch, took several tries to get baby to open deep. Mom needed a lot of assistance in positioning hands and latching. Encouraged comfort and props to assist in supporting head of baby.  Baby gagging prior to feeding at times, parents didn't know what baby was doing. After BF baby gagging several times, then had a large frothy mucous. Taught FOB suction.  Baby had void and a large stool. taught FOB how to change diaper.   Encouraged FOB will need to assist in obtaining a wide flange by chin tug if doesn't open wide. Noted baby has anterior tight frenulum. Encouraged mom to assess her breast before and after feeding for transfer. Stressed importance of I&O documentation, STS, supply and demand.  Mom encouraged to feed baby 8-12 times/24 hours and with feeding cues. Discussed cluster feeding.   WH/LC brochure given w/resources, support groups and LC services.  Patient Name: Alexis Shelly FlattenShelley Howell Today's Date: 11/27/2016 Reason for consult: Initial assessment   Maternal Data Has patient been taught Hand Expression?: Yes Does the patient have breastfeeding experience prior to this delivery?: No  Feeding Feeding Type: Breast Fed Length of feed: 15 min  LATCH Score/Interventions Latch: Repeated attempts needed to sustain latch, nipple held in mouth throughout feeding, stimulation needed to elicit sucking reflex. Intervention(s):  Adjust position;Assist with latch;Breast massage;Breast compression  Audible Swallowing: A few with stimulation Intervention(s): Skin to skin;Hand expression Intervention(s): Alternate breast massage  Type of Nipple: Flat Intervention(s): Shells;Hand pump  Comfort (Breast/Nipple): Filling, red/small blisters or bruises, mild/mod discomfort  Problem noted: Mild/Moderate discomfort Interventions (Mild/moderate discomfort): Hand massage;Hand expression;Pre-pump if needed  Hold (Positioning): Full assist, staff holds infant at breast Intervention(s): Breastfeeding basics reviewed;Support Pillows;Position options;Skin to skin  LATCH Score: 4  Lactation Tools Discussed/Used Tools: Shells;Pump Shell Type: Inverted Breast pump type: Double-Electric Breast Pump Pump Review: Milk Storage;Setup, frequency, and cleaning Initiated by:: Peri JeffersonL. Ivin Rosenbloom RN IBCLC Date initiated:: 11/27/16   Consult Status Consult Status: Follow-up Date: 11/27/16 (in pm) Follow-up type: In-patient    Alexis DancerCARVER, Alexis Howell 11/27/2016, 4:19 AM

## 2016-11-28 LAB — RH IG WORKUP (INCLUDES ABO/RH)
ABO/RH(D): O NEG
Fetal Screen: NEGATIVE
Gestational Age(Wks): 41.5
UNIT DIVISION: 0

## 2016-11-28 LAB — GLUCOSE, CAPILLARY: Glucose-Capillary: 94 mg/dL (ref 65–99)

## 2016-11-28 LAB — BIRTH TISSUE RECOVERY COLLECTION (PLACENTA DONATION)

## 2016-11-28 MED ORDER — POLYSACCHARIDE IRON COMPLEX 150 MG PO CAPS
150.0000 mg | ORAL_CAPSULE | Freq: Every day | ORAL | Status: DC
  Administered 2016-11-28: 150 mg via ORAL
  Filled 2016-11-28: qty 1

## 2016-11-28 MED ORDER — IBUPROFEN 600 MG PO TABS: 600.0000 mg | ORAL_TABLET | Freq: Four times a day (QID) | ORAL | 0 refills | Status: DC

## 2016-11-28 MED ORDER — OXYCODONE-ACETAMINOPHEN 5-325 MG PO TABS: 1.0000 | ORAL_TABLET | ORAL | 0 refills | Status: DC | PRN

## 2016-11-28 MED ORDER — POLYSACCHARIDE IRON COMPLEX 150 MG PO CAPS: 150.0000 mg | ORAL_CAPSULE | Freq: Every day | ORAL | 1 refills | Status: DC

## 2016-11-28 MED ORDER — MAGNESIUM OXIDE 400 MG PO TABS: 400.0000 mg | ORAL_TABLET | Freq: Every day | ORAL | Status: DC

## 2016-11-28 MED ORDER — ACETAMINOPHEN 325 MG PO TABS: 650.0000 mg | ORAL_TABLET | ORAL | Status: DC | PRN

## 2016-11-28 MED ORDER — COCONUT OIL OIL: 1.0000 "application " | TOPICAL_OIL | 0 refills | Status: DC | PRN

## 2016-11-28 MED ORDER — SENNOSIDES-DOCUSATE SODIUM 8.6-50 MG PO TABS: 2.0000 | ORAL_TABLET | ORAL | Status: DC

## 2016-11-28 NOTE — Lactation Note (Addendum)
This note was copied from a baby's chart. Lactation Consultation Note  Patient Name: Alexis Howell AVWUJ'WToday's Date: 11/28/2016  Alexis Howell  MBU RN informed LC mom declined wanting to see LC today.  And the CNM Alexis Howell  asked for the patient to have the baby given comfort gels.  LC gave the RN comfort gels and asked her to assess breast tissue and address in  Progress note findings. Baby is at 7% weight loss. LC reviewed doc flow sheets,  Baby has been to the breast several times 10 -20 mins , attempts , and snacks > 10 mins.  Voids and stools QS for age.       Maternal Data    Feeding     [facility]                      Lactation Tools Discussed/Used     Consult Status      Alexis Howell 11/28/2016, 10:50 AM

## 2016-11-28 NOTE — Discharge Summary (Signed)
OB Discharge Summary     Patient Name: Alexis Howell CommentShelley L Sidener DOB: 04-03-1981 MRN: [id number]  Date of admission: 11/26/2016 Delivering MD: Noland FordyceFOGLEMAN, KELLY   Date of discharge: 11/28/2016  Admitting diagnosis: protracted active phase of labor Intrauterine pregnancy: 3860w5d     Secondary diagnosis:  Principal Problem:   Postpartum care following cesarean delivery 6/9 Active Problems:   Prolonged first stage (of labor)   Delivery of pregnancy by cesarean section: Indication: arrest of dilation     Discharge diagnosis: Term Pregnancy Delivered and Anemia                                                                                                Post partum procedures:rhogam  Augmentation: Pitocin  Complications: None  Hospital course:  Onset of Labor With Unplanned C/S  36 y.o. yo G1P1001 at 3860w5d was admitted in Active Labor on 11/26/2016. Patient had a labor course significant for arrest of dilation 6 cm and cervical swelling. Membrane Rupture Time/Date: 9:00 PM ,11/27/2016   The patient went for cesarean section due to Arrest of Dilation, and delivered a Viable infant,11/26/2016  Details of operation can be found in separate operative note. Patient had an uncomplicated postpartum course.  She is ambulating,tolerating a regular diet, passing flatus, and urinating well.  Patient is discharged home in stable condition 11/28/16.  Physical exam  Vitals:   11/27/16 0619 11/27/16 1047 11/27/16 1805 11/28/16 0618  BP: (!) 90/51 118/69 109/87 124/67  Pulse: 82 85 90 63  Resp: 16 17 18 18   Temp: 98.2 F (36.8 C) 98 F (36.7 C) 98.7 F (37.1 C) 97.8 F (36.6 C)  TempSrc:    Oral  SpO2: 97% 98% 97% 100%  Weight:      Height:       General: alert, cooperative and no distress Lochia: appropriate Uterine Fundus: firm Incision: Healing well with no significant drainage DVT Evaluation: No cords or calf tenderness. Calf/Ankle edema is present Labs: Lab Results  Component Value Date    WBC 19.9 (H) 11/27/2016   HGB 10.9 (L) 11/27/2016   HCT 33.2 (L) 11/27/2016   MCV 83.4 11/27/2016   PLT 233 11/27/2016   CMP Latest Ref Rng & Units 07/30/2014  Glucose 70 - 99 mg/dL 86  BUN 6 - 23 mg/dL 11  Creatinine 7.820.50 - 9.561.10 mg/dL 2.130.84  Sodium 086135 - 578145 mmol/L 137  Potassium 3.5 - 5.1 mmol/L 5.0  Chloride 96 - 112 mmol/L 102  CO2 19 - 32 mmol/L 24  Calcium 8.4 - 10.5 mg/dL 9.8  Total Protein 6.0 - 8.3 g/dL -  Total Bilirubin 0.2 - 1.2 mg/dL -  Alkaline Phos 39 - 469117 U/L -  AST 0 - 37 U/L -  ALT 0 - 35 U/L -    Discharge instruction: per After Visit Summary and "Baby and Me Booklet".  After visit meds:  Allergies as of 11/28/2016   No Known Allergies     Medication List    STOP taking these medications   Evening Primrose Oil 1000 MG Caps     TAKE these medications  acetaminophen 325 MG tablet Commonly known as:  TYLENOL Take 2 tablets (650 mg total) by mouth every 4 (four) hours as needed (for pain scale < 4).   coconut oil Oil Apply 1 application topically as needed.   ibuprofen 600 MG tablet Commonly known as:  ADVIL,MOTRIN Take 1 tablet (600 mg total) by mouth every 6 (six) hours.   iron polysaccharides 150 MG capsule Commonly known as:  NIFEREX Take 1 capsule (150 mg total) by mouth daily.   magnesium oxide 400 MG tablet Commonly known as:  MAG-OX Take 1 tablet (400 mg total) by mouth daily.   oxyCODONE-acetaminophen 5-325 MG tablet Commonly known as:  ROXICET Take 1-2 tablets by mouth every 4 (four) hours as needed for severe pain.   prenatal multivitamin Tabs tablet Take 1 tablet by mouth daily at 12 noon.   PROCTOSOL HC 2.5 % rectal cream Generic drug:  hydrocortisone Place 1 application rectally 3 (three) times daily as needed for hemorrhoids.   senna-docusate 8.6-50 MG tablet Commonly known as:  Senokot-S Take 2 tablets by mouth daily. Start taking on:  11/29/2016       Diet: routine diet  Activity: Advance as tolerated. Pelvic  rest for 6 weeks.   Outpatient follow up:2 weeks Follow up Appt:No future appointments. Follow up Visit:No Follow-up on file.  Postpartum contraception: Not Discussed  Newborn Data: Live born female Shana Chute Birth Weight: 9 lb 8.7 oz (4330 g) APGAR: 8, 9  Baby Feeding: Breast Disposition:home with mother   11/28/2016 Neta Mends, CNM

## 2016-11-29 ENCOUNTER — Encounter (HOSPITAL_COMMUNITY): Payer: Self-pay | Admitting: *Deleted

## 2016-11-30 ENCOUNTER — Encounter (HOSPITAL_COMMUNITY): Payer: Self-pay | Admitting: Obstetrics

## 2017-02-26 ENCOUNTER — Encounter: Payer: Self-pay | Admitting: Pediatrics

## 2017-04-10 ENCOUNTER — Encounter: Payer: Self-pay | Admitting: Pediatrics

## 2017-04-23 ENCOUNTER — Encounter: Payer: Self-pay | Admitting: Pediatrics

## 2017-05-18 ENCOUNTER — Encounter: Payer: Self-pay | Admitting: Physician Assistant

## 2017-05-18 ENCOUNTER — Other Ambulatory Visit: Payer: Self-pay

## 2017-05-18 ENCOUNTER — Ambulatory Visit (INDEPENDENT_AMBULATORY_CARE_PROVIDER_SITE_OTHER): Payer: Commercial Managed Care - HMO | Admitting: Physician Assistant

## 2017-05-18 VITALS — BP 116/82 | HR 110 | Temp 98.4°F | Resp 18 | Ht 63.0 in | Wt 275.4 lb

## 2017-05-18 DIAGNOSIS — J0110 Acute frontal sinusitis, unspecified: Secondary | ICD-10-CM

## 2017-05-18 MED ORDER — AMOXICILLIN-POT CLAVULANATE 875-125 MG PO TABS: 1.0000 | ORAL_TABLET | Freq: Two times a day (BID) | ORAL | 0 refills | Status: DC

## 2017-05-18 NOTE — Progress Notes (Signed)
Patient ID: Alexis Howell CommentShelley L Glasper, female     DOB: 1980/09/14, 36 y.o.    MRN: [id number]  PCP: Porfirio OarJeffery, Nadezhda Pollitt, PA-C, who has retired  Stage managerChief Complaint  Patient presents with  . Sinus Problem    x10 days, pt states sinus issue started before Thanksgiving and states she has sinus pressure swelling and slight cough. Pt states she ran a fever over the weekend and took some Tylenol and hasn't had one since. Pt states she is a recent mom and would to be careful in what she takes because is supply issues.    Subjective:   This patient is new to me and presents for evaluation of a sinus problem. Last seen here in 05/2016.  Symptoms began 10 days ago.  Breast feeding her 305 month old son. Has had to increase her feeding to provide adequate stores. Doesn't want to take anything that will potentially reduce her milk supply.  Congestion, runny nose. "Everything changed from clear to yellow" this week. Cough is non-productive. Fever resolved with 2 doses of acetaminophen. No nausea, vomiting. Headachy and mild dizziness. "I'm plugged up."   Review of Systems As above.  Prior to Admission medications   Medication Sig Start Date End Date Taking? Authorizing Provider  acetaminophen (TYLENOL) 325 MG tablet Take 2 tablets (650 mg total) by mouth every 4 (four) hours as needed (for pain scale < 4). 11/28/16  Yes Arlan OrganPaul, Daniela C, CNM  coconut oil OIL Apply 1 application topically as needed. 11/28/16  Yes Neta MendsPaul, Daniela C, CNM  Prenatal Vit-Fe Fumarate-FA (PRENATAL MULTIVITAMIN) TABS tablet Take 1 tablet by mouth daily at 12 noon.   Yes [provider]     No Known Allergies   There are no active problems to display for this patient.    Family History  Problem Relation Age of Onset  . Heart disease Mother   . Seizures Sister   . Mental illness Paternal Grandmother      Social History   Socioeconomic History  . Marital status: Married    Spouse name: Elmon KirschnerDustin Pitts  .  Number of children: 1  . Years of education: Not on file  . Highest education level: Master's degree (e.g., MA, MS, MEng, MEd, MSW, MBA)  Social Needs  . Financial resource strain: Not on file  . Food insecurity - worry: Not on file  . Food insecurity - inability: Not on file  . Transportation needs - medical: Not on file  . Transportation needs - non-medical: Not on file  Occupational History  . 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 Smoking status: Former Smoker    Last attempt to quit: 07/30/2009    Years since quitting: 7.8  . Smokeless tobacco: Never Used  Substance and Sexual Activity  . Alcohol use: Yes    Alcohol/week: 0.0 oz    Comment: OCCASIONAL  . Drug use: No  . Sexual activity: Yes    Birth control/protection: None  Other Topics Concern  . Not on file  Social History Narrative   Master's degree in [employer] from [employer].   Lives with her husband, their son (born 11/26/2016) and dog and cat.   Family [place] spread out: [place], sister in [place], born [place]   Husband's family is local.         Objective:  Physical Exam  Constitutional: She is oriented to person, place, and time. She  appears well-developed and well-nourished. No distress.  BP 116/82 (BP Location: Right Arm, Patient Position: Sitting, Cuff Size: Large)   Pulse (!) 110   Temp 98.4 F (36.9 C) (Oral)   Resp 18   Ht 5\' 3"  (1.6 m)   Wt 275 lb 6.4 oz (124.9 kg)   SpO2 97%   Breastfeeding? Yes   BMI 48.78 kg/m    HENT:  Head: Normocephalic and atraumatic.  Right Ear: Hearing, tympanic membrane, external ear and ear canal normal.  Left Ear: Hearing, tympanic membrane, external ear and ear canal normal.  Nose: Mucosal edema and rhinorrhea present.  No foreign bodies. Right sinus exhibits maxillary sinus tenderness and frontal sinus tenderness. Left sinus exhibits maxillary sinus tenderness and frontal sinus tenderness.  Mouth/Throat:  Uvula is midline, oropharynx is clear and moist and mucous membranes are normal. No uvula swelling. No oropharyngeal exudate.  Eyes: Conjunctivae and EOM are normal. Pupils are equal, round, and reactive to light. Right eye exhibits no discharge. Left eye exhibits no discharge. No scleral icterus.  Neck: Trachea normal, normal range of motion and full passive range of motion without pain. Neck supple. No thyroid mass and no thyromegaly present.  Cardiovascular: Normal rate, regular rhythm and normal heart sounds.  Pulmonary/Chest: Effort normal and breath sounds normal.  Lymphadenopathy:       Head (right side): No submandibular, no tonsillar, no preauricular, no posterior auricular and no occipital adenopathy present.       Head (left side): No submandibular, no tonsillar, no preauricular and no occipital adenopathy present.    She has no cervical adenopathy.       Right: No supraclavicular adenopathy present.       Left: No supraclavicular adenopathy present.  Neurological: She is alert and oriented to person, place, and time. She has normal strength. No cranial nerve deficit or sensory deficit.  Skin: Skin is warm, dry and intact. No rash noted.  Psychiatric: She has a normal mood and affect. Her speech is normal and behavior is normal.        Assessment & Plan:  1. Acute non-recurrent frontal sinusitis Supportive care.  Anticipatory guidance.  RTC if symptoms worsen/persist. - amoxicillin-clavulanate (AUGMENTIN) 875-125 MG tablet; Take 1 tablet by mouth 2 (two) times daily.  Dispense: 20 tablet; Refill: 0    Return if symptoms worsen or fail to improve.   Fernande Brashelle S. Malcolm Hetz, PA-C [facility] at [facility]

## 2017-05-18 NOTE — Patient Instructions (Addendum)
Get plenty of rest and drink at least 64 ounces of water daily.    IF you received an x-ray today, you will receive an invoice from [facility]. Please contact [facility] at [phone] with questions or concerns regarding your invoice.   IF you received labwork today, you will receive an invoice from [facility]. Please contact [facility] at [phone] with questions or concerns regarding your invoice.   Our billing staff will not be able to assist you with questions regarding bills from these companies.  You will be contacted with the lab results as soon as they are available. The fastest way to get your results is to activate your My Chart account. Instructions are located on the last page of this paperwork. If you have not heard from us regarding the results in 2 weeks, please contact this office.      

## 2017-09-21 ENCOUNTER — Encounter: Payer: Self-pay | Admitting: Physician Assistant

## 2017-10-03 ENCOUNTER — Other Ambulatory Visit: Payer: Self-pay

## 2017-10-03 ENCOUNTER — Encounter: Payer: Self-pay | Admitting: Physician Assistant

## 2017-10-03 ENCOUNTER — Ambulatory Visit: Payer: Commercial Managed Care - HMO | Admitting: Physician Assistant

## 2017-10-03 VITALS — BP 128/70 | HR 94 | Temp 98.8°F | Resp 16 | Ht 63.0 in | Wt 276.8 lb

## 2017-10-03 DIAGNOSIS — Z6841 Body Mass Index (BMI) 40.0 and over, adult: Secondary | ICD-10-CM | POA: Diagnosis not present

## 2017-10-03 DIAGNOSIS — M722 Plantar fascial fibromatosis: Secondary | ICD-10-CM

## 2017-10-03 NOTE — Patient Instructions (Addendum)
IF you received an x-ray today, you will receive an invoice from [facility]. Please contact [facility] at [phone] with questions or concerns regarding your invoice.   IF you received labwork today, you will receive an invoice from [facility]. Please contact [facility] at [phone] with questions or concerns regarding your invoice.   Our billing staff will not be able to assist you with questions regarding bills from these companies.  You will be contacted with the lab results as soon as they are available. The fastest way to get your results is to activate your My Chart account. Instructions are located on the last page of this paperwork. If you have not heard from us regarding the results in 2 weeks, please contact this office.      Plantar Fasciitis Plantar fasciitis is a painful foot condition that affects the heel. It occurs when the band of tissue that connects the toes to the heel bone (plantar fascia) becomes irritated. This can happen after exercising too much or doing other repetitive activities (overuse injury). The pain from plantar fasciitis can range from mild irritation to severe pain that makes it difficult for you to walk or move. The pain is usually worse in the morning or after you have been sitting or lying down for a while. What are the causes? This condition may be caused by:  Standing for long periods of time.  Wearing shoes that do not fit.  Doing high-impact activities, including running, aerobics, and ballet.  Being overweight.  Having an abnormal way of walking (gait).  Having tight calf muscles.  Having high arches in your feet.  Starting a new athletic activity.  What are the signs or symptoms? The main symptom of this condition is heel pain. Other symptoms include:  Pain that gets worse after activity or exercise.  Pain that is worse in the morning or after resting.  Pain that goes away after you walk for a few  minutes.  How is this diagnosed? This condition may be diagnosed based on your signs and symptoms. Your health care provider will also do a physical exam to check for:  A tender area on the bottom of your foot.  A high arch in your foot.  Pain when you move your foot.  Difficulty moving your foot.  You may also need to have imaging studies to confirm the diagnosis. These can include:  X-rays.  Ultrasound.  MRI.  How is this treated? Treatment for plantar fasciitis depends on the severity of the condition. Your treatment may include:  Rest, ice, and over-the-counter pain medicines to manage your pain.  Exercises to stretch your calves and your plantar fascia.  A splint that holds your foot in a stretched, upward position while you sleep (night splint).  Physical therapy to relieve symptoms and prevent problems in the future.  Cortisone injections to relieve severe pain.  Extracorporeal shock wave therapy (ESWT) to stimulate damaged plantar fascia with electrical impulses. It is often used as a last resort before surgery.  Surgery, if other treatments have not worked after 12 months.  Follow these instructions at home:  Take medicines only as directed by your health care provider.  Avoid activities that cause pain.  Roll the bottom of your foot over a bag of ice or a bottle of cold water. Do this for 20 minutes, 3-4 times a day.  Perform simple stretches as directed by your health care provider.  Try wearing athletic shoes with air-sole or  gel-sole cushions or soft shoe inserts.  Wear a night splint while sleeping, if directed by your health care provider.  Keep all follow-up appointments with your health care provider. How is this prevented?  Do not perform exercises or activities that cause heel pain.  Consider finding low-impact activities if you continue to have problems.  Lose weight if you need to. The best way to prevent plantar fasciitis is to avoid  the activities that aggravate your plantar fascia. Contact a health care provider if:  Your symptoms do not go away after treatment with home care measures.  Your pain gets worse.  Your pain affects your ability to move or do your daily activities. This information is not intended to replace advice given to you by your health care provider. Make sure you discuss any questions you have with your health care provider. Document Released: 03/01/2001 Document Revised: 11/09/2015 Document Reviewed: 04/16/2014 Elsevier Interactive Patient Education  2018 [employer] Inc.  Plantar Fasciitis Rehab Ask your health care provider which exercises are safe for you. Do exercises exactly as told by your health care provider and adjust them as directed. It is normal to feel mild stretching, pulling, tightness, or discomfort as you do these exercises, but you should stop right away if you feel sudden pain or your pain gets worse. Do not begin these exercises until told by your health care provider. Stretching and range of motion exercises These exercises warm up your muscles and joints and improve the movement and flexibility of your foot. These exercises also help to relieve pain. Exercise A: Plantar fascia stretch  1. Sit with your left / right leg crossed over your opposite knee. 2. Hold your heel with one hand with that thumb near your arch. With your other hand, hold your toes and gently pull them back toward the top of your foot. You should feel a stretch on the bottom of your toes or your foot or both. 3. Hold this stretch for_____5-10_____ seconds. 4. Slowly release your toes and return to the starting position. Repeat ____5-10______ times. Complete this exercise _1-2_________ times a day. Exercise B: Gastroc, standing  1. Stand with your hands against a wall. 2. Extend your left / right leg behind you, and bend your front knee slightly. 3. Keeping your heels on the floor and keeping your back knee  straight, shift your weight toward the wall without arching your back. You should feel a gentle stretch in your left / right calf. 4. Hold this position for _____5-10_____ seconds. Repeat ____5-10______ times. Complete this exercise ___1-2_______ times a day. Exercise C: Soleus, standing 1. Stand with your hands against a wall. 2. Extend your left / right leg behind you, and bend your front knee slightly. 3. Keeping your heels on the floor, bend your back knee and slightly shift your weight over the back leg. You should feel a gentle stretch deep in your calf. 4. Hold this position for _____5-10_____ seconds. Repeat ____5-10______ times. Complete this exercise _1-2_________ times a day. Exercise D: Gastrocsoleus, standing 1. Stand with the ball of your left / right foot on a step. The ball of your foot is on the walking surface, right under your toes. 2. Keep your other foot firmly on the same step. 3. Hold onto the wall or a railing for balance. 4. Slowly lift your other foot, allowing your body weight to press your heel down over the edge of the step. You should feel a stretch in your left / right calf.  5. Hold this position for ____5-10______ seconds. 6. Return both feet to the step. 7. Repeat this exercise with a slight bend in your left / right knee. Repeat ____5-10______ times with your left / right knee straight and ____5-10______ times with your left / right knee bent. Complete this exercise _____1-2_____ times a day. Balance exercise This exercise builds your balance and strength control of your arch to help take pressure off your plantar fascia. Exercise E: Single leg stand 1. Without shoes, stand near a railing or in a doorway. You may hold onto the railing or door frame as needed. 2. Stand on your left / right foot. Keep your big toe down on the floor and try to keep your arch lifted. Do not let your foot roll inward. 3. Hold this position for _____5-10_____ seconds. 4. If this  exercise is too easy, you can try it with your eyes closed or while standing on a pillow. Repeat _____5-10_____ times. Complete this exercise ____1-2______ times a day. This information is not intended to replace advice given to you by your health care provider. Make sure you discuss any questions you have with your health care provider. Document Released: 06/06/2005 Document Revised: 02/09/2016 Document Reviewed: 04/20/2015 Elsevier Interactive Patient Education  2018 [employer].

## 2017-10-03 NOTE — Progress Notes (Signed)
Subjective:    Patient ID: Alexis Howell CommentShelley L Aime, female    DOB: 08-29-1980, 37 y.o.   MRN: [id number] Chief Complaint  Patient presents with  . Foot Pain    left heel radiates around since Feb     HPI  Reports chronic symptom that started February on her posterolateral aspect of foot. Pain is now radiating posterolateral and medial as well as down into her heel. Pain is worse in the AM upon waking and feels stiff. Takes 2620min-1hr to feel back to normal after waking.  No pain in the AM if she lets it "warm up" before walking. Reports trying new shoes (hiking shoes with good ankle support and arch support) without relief and is now using older shoes that provide some passive plantar flexion and provide relief.  Ibuprofen at night and rest has provided some relief.  Reports lateral knee pain that began in the past few weeks. Does not hurt if patient allows her foot to "warm up"  Denies injuries, how swollen joints, fevers, chills.  Review of Systems  Constitutional: Positive for activity change. Negative for appetite change, chills, diaphoresis, fatigue, fever and unexpected weight change.  HENT: Negative.   Eyes: Negative.   Respiratory: Negative.   Gastrointestinal: Negative.   Endocrine: Negative.   Genitourinary: Negative.   Musculoskeletal: Positive for arthralgias and gait problem. Negative for back pain, joint swelling and myalgias.  Skin: Negative.   Allergic/Immunologic: Negative.   Hematological: Negative.   Psychiatric/Behavioral: Negative.     There are no active problems to display for this patient.  Past Medical History:  Diagnosis Date  . Delivery of pregnancy by cesarean section: Indication: arrest of dilation 11/26/2016  . Medical history non-contributory   . Prolonged first stage (of labor) 11/26/2016   Prior to Admission medications   Medication Sig Start Date End Date Taking? Authorizing Provider  coconut oil OIL Apply 1 application topically as needed.  11/28/16  Yes Neta MendsPaul, Daniela C, CNM  Prenatal Vit-Fe Fumarate-FA (PRENATAL MULTIVITAMIN) TABS tablet Take 1 tablet by mouth daily at 12 noon.   Yes [provider]  acetaminophen (TYLENOL) 325 MG tablet Take 2 tablets (650 mg total) by mouth every 4 (four) hours as needed (for pain scale < 4). Patient not taking: Reported on 10/03/2017 11/28/16   Neta MendsPaul, Daniela C, CNM  amoxicillin-clavulanate (AUGMENTIN) 875-125 MG tablet Take 1 tablet by mouth 2 (two) times daily. Patient not taking: Reported on 10/03/2017 05/18/17   Porfirio OarJeffery, Chelle, PA-C  PROCTOSOL HC 2.5 % rectal cream Place 1 application rectally 3 (three) times daily as needed for hemorrhoids.  11/07/16   [provider]   No Known Allergies     Objective:   Physical Exam  Constitutional: She is oriented to person, place, and time. She appears well-developed and well-nourished. No distress.  BP 128/70   Pulse 94   Temp 98.8 F (37.1 C)   Resp 16   Ht 5\' 3"  (1.6 m)   Wt 276 lb 12.8 oz (125.6 kg)   SpO2 97%   BMI 49.03 kg/m    HENT:  Head: Normocephalic and atraumatic.  Nose: Nose normal.  Eyes: Pupils are equal, round, and reactive to light. Conjunctivae and EOM are normal. Right eye exhibits no discharge. Left eye exhibits no discharge.  Neck: Normal range of motion. Neck supple. No tracheal deviation present. No thyromegaly present.  Cardiovascular: Normal rate, regular rhythm and intact distal pulses. Exam reveals no gallop and no friction rub.  No murmur  heard. Pulses:      Radial pulses are 2+ on the right side.       Dorsalis pedis pulses are 2+ on the right side.       Posterior tibial pulses are 2+ on the right side.  Pulmonary/Chest: Effort normal and breath sounds normal. No respiratory distress. She has no wheezes. She has no rales.  Musculoskeletal: Normal range of motion. She exhibits tenderness. She exhibits no edema.  Pain with great toe flexion along ATFL Pain with eversion No pain with  dorsiflex, plantar flex, inversion. Pain along insertion of plantar fascia with palpation  Neurological: She is alert and oriented to person, place, and time. She has normal reflexes.  Skin: Skin is warm and dry. She is not diaphoretic. No erythema.  Psychiatric: She has a normal mood and affect. Her behavior is normal.      Assessment & Plan:  1. Plantar fasciitis of left foot Referred to podiatry. Try exercises/stretches at home.  - Ambulatory referral to Podiatry  Return if symptoms worsen or fail to improve.

## 2017-10-03 NOTE — Progress Notes (Signed)
.     Patient ID: Alexis Howell Comment, female    DOB: [birth year], 37 y.o.   MRN: [id number]  PCP: Porfirio Oar, PA-C  Chief Complaint  Patient presents with  . Foot Pain    left heel radiates around since Feb     Subjective:   Presents for evaluation of LEFT heel pain since February.  Pain presents with pain in the LEFT heel that is worst upon waking and initial ambulation each day and can take up to an hour to resolve.  Tried new hiking shoes without benefit. Some older shoes that position her foot in plantar flexion seem to help some. Ibuprofen at HS helps some. Associated lateral knee pain resolves once her foot "warms up."     Review of Systems Constitutional: Positive for activity change. Negative for appetite change, chills, diaphoresis, fatigue, fever and unexpected weight change.  HENT: Negative.   Eyes: Negative.   Respiratory: Negative.   Gastrointestinal: Negative.   Endocrine: Negative.   Genitourinary: Negative.   Musculoskeletal: Positive for arthralgias and gait problem. Negative for back pain, joint swelling and myalgias.  Skin: Negative.   Allergic/Immunologic: Negative.   Hematological: Negative.   Psychiatric/Behavioral: Negative.        There are no active problems to display for this patient.    Prior to Admission medications   Medication Sig Start Date End Date Taking? Authorizing Provider  coconut oil OIL Apply 1 application topically as needed. 11/28/16  Yes Neta Mends, CNM  Prenatal Vit-Fe Fumarate-FA (PRENATAL MULTIVITAMIN) TABS tablet Take 1 tablet by mouth daily at 12 noon.   Yes [provider]  acetaminophen (TYLENOL) 325 MG tablet Take 2 tablets (650 mg total) by mouth every 4 (four) hours as needed (for pain scale < 4). Patient not taking: Reported on 10/03/2017 11/28/16   Neta Mends, CNM  amoxicillin-clavulanate (AUGMENTIN) 875-125 MG tablet Take 1 tablet by mouth 2 (two) times daily. Patient not taking:  Reported on 10/03/2017 05/18/17   Porfirio Oar, PA-C  PROCTOSOL HC 2.5 % rectal cream Place 1 application rectally 3 (three) times daily as needed for hemorrhoids.  11/07/16   [provider]     No Known Allergies     Objective:  Physical Exam  Constitutional: She is oriented to person, place, and time. She appears well-developed and well-nourished. She is active and cooperative. No distress.  BP 128/70   Pulse 94   Temp 98.8 F (37.1 C)   Resp 16   Ht 5\' 3"  (1.6 m)   Wt 276 lb 12.8 oz (125.6 kg)   SpO2 97%   BMI 49.03 kg/m    Eyes: Conjunctivae are normal.  Pulmonary/Chest: Effort normal.  Musculoskeletal:       Left knee: Normal.       Left ankle: Normal. Achilles tendon normal.       Left lower leg: Normal.       Left foot: There is normal range of motion, no bony tenderness, no swelling, normal capillary refill, no crepitus, no deformity and no laceration. Tenderness: plantar surface of the anterior heel.       Feet:  Neurological: She is alert and oriented to person, place, and time.  Psychiatric: She has a normal mood and affect. Her speech is normal and behavior is normal.       Assessment & Plan:   1. Plantar fasciitis of left foot Continue NSAIDS. Add ice massage and exercises. WEight loss will help. - Ambulatory referral  to Podiatry  2. BMI 45.0-49.9, adult (HCC) Weight contributes to plantar fasciitis, and increases her risk for numerous other health conditions. Ready to seek help with weight loss. - Amb Ref to Medical Weight Management    Return if symptoms worsen or fail to improve.   Fernande Brashelle S. Laela Deviney, PA-C [facility]

## 2017-10-18 ENCOUNTER — Ambulatory Visit: Payer: BLUE CROSS/BLUE SHIELD | Admitting: Podiatry

## 2017-10-30 ENCOUNTER — Ambulatory Visit (INDEPENDENT_AMBULATORY_CARE_PROVIDER_SITE_OTHER): Payer: BLUE CROSS/BLUE SHIELD

## 2017-10-30 ENCOUNTER — Encounter: Payer: Self-pay | Admitting: Podiatry

## 2017-10-30 ENCOUNTER — Ambulatory Visit: Payer: BLUE CROSS/BLUE SHIELD | Admitting: Podiatry

## 2017-10-30 DIAGNOSIS — M79672 Pain in left foot: Secondary | ICD-10-CM

## 2017-11-01 NOTE — Progress Notes (Signed)
   Subjective: 37 year old female presenting today as a new patient with a chief complaint of an exacerbation of plantar fasciitis of the left foot that began a few weeks ago. Walking and standing for long periods of time increases the pain. She has not done anything for treatment. Patient is here for further evaluation and treatment.   Past Medical History:  Diagnosis Date  . Delivery of pregnancy by cesarean section: Indication: arrest of dilation 11/26/2016  . Medical history non-contributory   . Prolonged first stage (of labor) 11/26/2016     Objective: Physical Exam General: The patient is alert and oriented x3 in no acute distress.  Dermatology: Skin is warm, dry and supple bilateral lower extremities. Negative for open lesions or macerations bilateral.   Vascular: Dorsalis Pedis and Posterior Tibial pulses palpable bilateral.  Capillary fill time is immediate to all digits.  Neurological: Epicritic and protective threshold intact bilateral.   Musculoskeletal: Tenderness to palpation to the plantar aspect of the left heel along the plantar fascia. All other joints range of motion within normal limits bilateral. Strength 5/5 in all groups bilateral.   Radiographic exam:   Normal osseous mineralization. Joint spaces preserved. No fracture/dislocation/boney destruction. No other soft tissue abnormalities or radiopaque foreign bodies.   Assessment: 1. Plantar fasciitis left foot  Plan of Care:  1. Patient evaluated. Xrays reviewed.   2. Injection of 0.5cc Celestone soluspan injected into the left plantar fascia.  3. Rx for Meloxicam provided to patient.  4. Patient mainly wears sandals so custom molded orthotics would not be beneficial.  5. Plantar fascial band(s) dispensed  6. Instructed patient regarding therapies and modalities at home to alleviate symptoms.  7. Return to clinic in 4 weeks.    Has an 55-month-old son named Visual merchandiser.    Felecia Shelling, DPM [facility]  Dr. Felecia Shelling, DPM    [address] [place], [place] [address]                Office [phone]  Fax [phone]

## 2017-11-02 ENCOUNTER — Telehealth: Payer: Self-pay | Admitting: Podiatry

## 2017-11-02 MED ORDER — MELOXICAM 15 MG PO TABS: 15.0000 mg | ORAL_TABLET | Freq: Every day | ORAL | 1 refills | Status: DC

## 2017-11-02 NOTE — Telephone Encounter (Signed)
Mrs. Kamrowski said her Pharmacy has not received the RX that was suppose to be sent. She left a voicemail at 5:40pm 5/15 checking on the RX.

## 2017-11-02 NOTE — Telephone Encounter (Signed)
Pt. Lf voicemail checking on RX that was suppose to be sent to Pharmacy. She left vm at 5:40pm 5/15.

## 2017-11-02 NOTE — Telephone Encounter (Signed)
Left message informing pt the meloxicam had been sent to the [id number], and I apologized for the delay.

## 2017-11-02 NOTE — Addendum Note (Signed)
Addended by: Alphia Kava D on: 11/02/2017 09:12 AM   Modules accepted: Orders

## 2017-11-22 ENCOUNTER — Encounter (INDEPENDENT_AMBULATORY_CARE_PROVIDER_SITE_OTHER): Payer: Self-pay

## 2017-12-04 ENCOUNTER — Ambulatory Visit: Payer: BLUE CROSS/BLUE SHIELD | Admitting: Podiatry

## 2017-12-04 DIAGNOSIS — M79672 Pain in left foot: Secondary | ICD-10-CM

## 2017-12-04 DIAGNOSIS — M722 Plantar fascial fibromatosis: Secondary | ICD-10-CM

## 2017-12-11 NOTE — Progress Notes (Signed)
   Subjective: 37 year old female presenting today for follow up evaluation of plantar fasciitis of the left foot. She states the pain improved for a few weeks but returned. She states the pain is still better than it was at her previous visit. She has been taking Meloxicam as directed and wearing shoes with good arch supports. Patient is here for further evaluation and treatment.   Past Medical History:  Diagnosis Date  . Delivery of pregnancy by cesarean section: Indication: arrest of dilation 11/26/2016  . Medical history non-contributory   . Prolonged first stage (of labor) 11/26/2016     Objective: Physical Exam General: The patient is alert and oriented x3 in no acute distress.  Dermatology: Skin is warm, dry and supple bilateral lower extremities. Negative for open lesions or macerations bilateral.   Vascular: Dorsalis Pedis and Posterior Tibial pulses palpable bilateral.  Capillary fill time is immediate to all digits.  Neurological: Epicritic and protective threshold intact bilateral.   Musculoskeletal: Tenderness to palpation to the plantar aspect of the left heel along the plantar fascia. All other joints range of motion within normal limits bilateral. Strength 5/5 in all groups bilateral.   Assessment: 1. Plantar fasciitis left foot - improved   Plan of Care:  1. Patient evaluated.    2. Injection of 0.5cc Celestone soluspan injected into the left plantar fascia.  3.Continue taking Meloxicam daily.  4. Continue wearing sandals with good arch support.  5. Continue stretching daily.  6. Return to clinic in 4 weeks.    Has an 4164-month-old son named Visual merchandiservan.    Felecia ShellingBrent M. Evans, DPM [facility]  Dr. Felecia ShellingBrent M. Evans, DPM    [address]                                     [place], [place] [id number]                Office [phone]  Fax [phone]

## 2018-07-24 ENCOUNTER — Other Ambulatory Visit (HOSPITAL_COMMUNITY): Payer: Self-pay | Admitting: Certified Nurse Midwife

## 2018-07-24 ENCOUNTER — Other Ambulatory Visit: Payer: Self-pay | Admitting: Certified Nurse Midwife

## 2018-07-24 DIAGNOSIS — Z3201 Encounter for pregnancy test, result positive: Secondary | ICD-10-CM

## 2018-07-31 ENCOUNTER — Ambulatory Visit (HOSPITAL_COMMUNITY)
Admission: RE | Admit: 2018-07-31 | Discharge: 2018-07-31 | Disposition: A | Discharge status: Home / Self Care | Payer: Medicaid Other | Source: Ambulatory Visit | Attending: Certified Nurse Midwife | Admitting: Certified Nurse Midwife

## 2018-07-31 DIAGNOSIS — Z3201 Encounter for pregnancy test, result positive: Secondary | ICD-10-CM | POA: Diagnosis not present

## 2018-12-14 ENCOUNTER — Other Ambulatory Visit: Payer: Self-pay | Admitting: Family Medicine

## 2018-12-14 DIAGNOSIS — N83209 Unspecified ovarian cyst, unspecified side: Secondary | ICD-10-CM

## 2018-12-19 ENCOUNTER — Ambulatory Visit
Admission: RE | Admit: 2018-12-19 | Discharge: 2018-12-19 | Disposition: A | Discharge status: Home / Self Care | Payer: Medicaid Other | Source: Ambulatory Visit | Attending: Family Medicine | Admitting: Family Medicine

## 2018-12-19 DIAGNOSIS — N83209 Unspecified ovarian cyst, unspecified side: Secondary | ICD-10-CM

## 2018-12-20 ENCOUNTER — Other Ambulatory Visit: Payer: Medicaid Other

## 2018-12-26 ENCOUNTER — Other Ambulatory Visit: Payer: Medicaid Other

## 2020-01-22 ENCOUNTER — Other Ambulatory Visit: Payer: Self-pay | Admitting: Family Medicine

## 2020-01-24 ENCOUNTER — Other Ambulatory Visit: Payer: Self-pay | Admitting: Certified Nurse Midwife

## 2020-01-24 DIAGNOSIS — R109 Unspecified abdominal pain: Secondary | ICD-10-CM

## 2020-01-24 DIAGNOSIS — R102 Pelvic and perineal pain: Secondary | ICD-10-CM

## 2020-01-24 DIAGNOSIS — R1900 Intra-abdominal and pelvic swelling, mass and lump, unspecified site: Secondary | ICD-10-CM

## 2020-01-24 DIAGNOSIS — R319 Hematuria, unspecified: Secondary | ICD-10-CM

## 2020-01-30 ENCOUNTER — Ambulatory Visit
Admission: RE | Admit: 2020-01-30 | Discharge: 2020-01-30 | Disposition: A | Discharge status: Home / Self Care | Payer: Medicaid Other | Source: Ambulatory Visit | Attending: Certified Nurse Midwife | Admitting: Certified Nurse Midwife

## 2020-01-30 DIAGNOSIS — R109 Unspecified abdominal pain: Secondary | ICD-10-CM

## 2020-01-30 DIAGNOSIS — R102 Pelvic and perineal pain: Secondary | ICD-10-CM

## 2020-01-30 DIAGNOSIS — R319 Hematuria, unspecified: Secondary | ICD-10-CM

## 2020-01-30 DIAGNOSIS — R1900 Intra-abdominal and pelvic swelling, mass and lump, unspecified site: Secondary | ICD-10-CM

## 2021-06-10 ENCOUNTER — Other Ambulatory Visit: Payer: Self-pay | Admitting: Family Medicine

## 2021-06-14 LAB — HEPATIC FUNCTION PANEL
ALT: 40 — AB (ref 7–35)
AST: 42 — AB (ref 13–35)
Alkaline Phosphatase: 84 (ref 25–125)
Bilirubin, Total: 0.5

## 2021-06-14 LAB — LIPID PANEL
Cholesterol: 263 — AB (ref 0–200)
HDL: 35 (ref 35–70)
LDL Cholesterol: 197
LDl/HDL Ratio: 5.6
Triglycerides: 155 (ref 40–160)

## 2021-06-14 LAB — HEMOGLOBIN A1C: Hemoglobin A1C: 5.6

## 2021-06-14 LAB — CBC AND DIFFERENTIAL
HCT: 42 (ref 36–46)
Hemoglobin: 14 (ref 12.0–16.0)
Platelets: 368 (ref 150–399)
WBC: 7

## 2021-06-14 LAB — BASIC METABOLIC PANEL
BUN: 13 (ref 4–21)
CO2: 29 — AB (ref 13–22)
Chloride: 100 (ref 99–108)
Creatinine: 0.9 (ref 0.5–1.1)
Glucose: 89
Potassium: 5.1 (ref 3.4–5.3)
Sodium: 138 (ref 137–147)

## 2021-06-14 LAB — COMPREHENSIVE METABOLIC PANEL
Albumin: 4.4 (ref 3.5–5.0)
Calcium: 9.6 (ref 8.7–10.7)
GFR calc Af Amer: 94
GFR calc non Af Amer: 82

## 2021-06-14 LAB — CBC: RBC: 5.2 — AB (ref 3.87–5.11)

## 2021-07-01 ENCOUNTER — Encounter: Payer: Self-pay | Admitting: Nurse Practitioner

## 2021-07-07 DIAGNOSIS — Z8719 Personal history of other diseases of the digestive system: Secondary | ICD-10-CM | POA: Insufficient documentation

## 2021-07-07 DIAGNOSIS — E282 Polycystic ovarian syndrome: Secondary | ICD-10-CM | POA: Insufficient documentation

## 2021-07-07 DIAGNOSIS — N83202 Unspecified ovarian cyst, left side: Secondary | ICD-10-CM

## 2021-07-07 LAB — LIPASE
Lipase: 20
PTH: 40.7

## 2021-07-16 ENCOUNTER — Ambulatory Visit: Payer: Medicaid Other | Admitting: Nurse Practitioner

## 2021-07-16 ENCOUNTER — Encounter: Payer: Self-pay | Admitting: Nurse Practitioner

## 2021-07-16 VITALS — BP 126/88 | HR 91 | Ht 64.0 in | Wt 295.0 lb

## 2021-07-16 DIAGNOSIS — R1011 Right upper quadrant pain: Secondary | ICD-10-CM | POA: Diagnosis not present

## 2021-07-16 DIAGNOSIS — K859 Acute pancreatitis without necrosis or infection, unspecified: Secondary | ICD-10-CM | POA: Diagnosis not present

## 2021-07-16 NOTE — Progress Notes (Signed)
ASSESSMENT AND PLAN    # 41 yo female with chronic upper abdominal pain ( mainly RUQ radiating round to right back) for ~ 1 year which is unrelated to eating.   She says that initially the pain was attributed to a kidney stone which she passed.  At some point the episodes of pain began recurring in the absence of recurrent kidney stones.  Interestingly, in late December a NON_CONTRAST scan showed acute pancreatitis (uncomplicated).  The etiology of her RUQ pain is unclear as it doesn't fit with pancreatitis. She is s/p cholecystectomy for cholecystitis/cholelithiasis in 2016. Could be musculoskeletal?  PUD?  --I think she my need an EGD at some point .  However, I would first like to discuss the case with Dr. Ardis Hughs and see if he thinks any additional/further imaging is needed of her pancreas.   # Acute pancreatitis in December 2022. Etiology ?  Possibly alcohol but she only drinks a bottle of wine ( in one sitting ) about once a month and no Etoh in between.  No other apparent risk factors for pancreatitis.  As mentioned previously her gallbladder is out.  She had very mild elevation in her liver enzymes but has fatty liver . No biliary duct dilation.  Serum calcium and triglyceride levels are normal.  No family history of pancreatic diseases --We will discuss with Dr. Ardis Hughs about whether there is a need to proceed with additional abdominal imaging.  In the interim I have asked her to abstain from alcohol  # Mild elevation in liver enzymes. AST 42 / ALT 40. History of fatty liver on imaging   HISTORY OF PRESENT ILLNESS     Chief Complaint : pancreatitis  Alexis Howell is a 41 y.o. female with a past medical history significant for PCOS, kidney stones, pancreatitis( new) . See PMH below for any additional history.   Patient referred by PCP for pancreatitis. She is accompanied by her husband. Alexis Howell has been having upper abdominal pain for about a year.  Though she periodically gets  LUQ pain, the majority of time the pain is an achy , full discomfort in RUQ radiating around and through to her right mid back. In addition to this almost constant discomfort in the right upper quadrant she also gets intermittent stabbing pain in the area. When the pain started a year ago she was diagnosed with a kidney stone. She passed the stone and pain resolved but response wasn't durable. She has still been having RUQ pain and recurrent kidney stones have been ruled out. She saw PCP in late December with worsening pain. Labs were really unremarkable but CT scan showed pancreatitis. Though much better compared to when diagnosed with pancreatitis in late December she continues have frequent RUQ pain radiating around and through to right mid back. Pain is not related to eating, it is not position. Heating pad offers some relief.   She drinks Etoh. Once a month she may have a bottle of wine in one setting but in between she doesn't drink Etoh. No Maury of pancreas problems. Leading up to being diagnosed with pancreatitis she was not taking any medications ( including nsaids) or supplements.   Lipase 20. Triglycerides 155. Serum calcium normal.  AST 40 / AST 42. She is post cholecystectomy in 2016 ( symptomatic cholelithiasis). Pathology chronic cholecystitis with cholelithiasis   Leading up to pancreatitis she was sick around November with URI symptoms. She was negative for COVID.     Data  Reviewed:  CBC Latest Ref Rng & Units 06/14/2021 11/27/2016 11/26/2016  WBC - 7.0 19.9(H) 25.9(H)  Hemoglobin 12.0 - 16.0 14.0 10.9(L) 14.2  Hematocrit 36 - 46 42 33.2(L) 40.6  Platelets 150 - 399 368 233 285    Lab Results  Component Value Date   LIPASE 20.0 06/14/2021   CMP Latest Ref Rng & Units 06/14/2021 07/30/2014 07/20/2014  Glucose 70 - 99 mg/dL - 86 86  BUN 4 - 21 13 11 11   Creatinine 0.5 - 1.1 0.9 0.84 0.86  Sodium 137 - 147 138 137 138  Potassium 3.4 - 5.3 5.1 5.0 4.6  Chloride 99 - 108 100 102  100  CO2 13 - 22 29(A) 24 26  Calcium 8.7 - 10.7 9.6 9.8 10.0  Total Protein 6.0 - 8.3 g/dL - - 7.6  Total Bilirubin 0.2 - 1.2 mg/dL - - 0.6  Alkaline Phos 25 - 125 84 - 78  AST 13 - 35 42(A) - 16  ALT 7 - 35 40(A) - 14    06/11/21 Non contrast CTAP IMPRESSION:  1.  Findings of acute pancreatitis involving the pancreatic head. No associated fluid collections. Otherwise negative exam.   PREVIOUS GI EVALUATIONS:  none  Past Medical History:  Diagnosis Date   Delivery of pregnancy by cesarean section: Indication: arrest of dilation 11/26/2016   Hyperlipemia    Kidney stones    Obesity    Pancreatitis    PCOS (polycystic ovarian syndrome)    Prolonged first stage (of labor) 11/26/2016     Past Surgical History:  Procedure Laterality Date   CESAREAN SECTION N/A 11/26/2016   Procedure: CESAREAN SECTION;  Surgeon: Aloha Gell, MD;  Location: [facility];  Service: Obstetrics;  Laterality: N/A;   CHOLECYSTECTOMY N/A 08/12/2014   Procedure: LAPAROSCOPIC CHOLECYSTECTOMY WITH INTRAOPERATIVE CHOLANGIOGRAM;  Surgeon: Gayland Curry, MD;  Location: [facility];  Service: General;  Laterality: N/A;   TONSILLECTOMY AND ADENOIDECTOMY     WISDOM TOOTH EXTRACTION     Family History  Problem Relation Age of Onset   Heart disease Mother    Seizures Sister    Mental illness Paternal Grandmother    Social History   Tobacco Use   Smoking status: Former    Types: Cigarettes    Quit date: 07/30/2009    Years since quitting: 11.9   Smokeless tobacco: Never  Vaping Use   Vaping Use: Never used  Substance Use Topics   Alcohol use: Yes    Alcohol/week: 0.0 standard drinks    Comment: OCCASIONAL   Drug use: No   Current Outpatient Medications  Medication Sig Dispense Refill   Barberry-Oreg Grape-Goldenseal (BERBERINE COMPLEX PO) Take by mouth.     Multiple Vitamin (MULTIVITAMIN) capsule Take 1 capsule by mouth daily.     Red Yeast Rice Extract (RED YEAST RICE PO) Take by mouth.      spironolactone (ALDACTONE) 50 MG tablet Take 50 mg by mouth daily.     No current facility-administered medications for this visit.   No Known Allergies   Review of Systems:  All  systems reviewed and negative except where noted in HPI.    PHYSICAL EXAM :    Wt Readings from Last 3 Encounters:  07/16/21 295 lb (133.8 kg)  10/03/17 276 lb 12.8 oz (125.6 kg)  05/18/17 275 lb 6.4 oz (124.9 kg)    Ht 5\' 4"  (1.626 m)    Wt 295 lb (133.8 kg)    BMI 50.64 kg/m  Constitutional:  Generally well appearing female in no acute distress. Psychiatric: Pleasant. Normal mood and affect. Behavior is normal. EENT: Pupils normal.  Conjunctivae are normal. No scleral icterus. Neck supple.  Cardiovascular: Normal rate, regular rhythm. No edema Pulmonary/chest: Effort normal and breath sounds normal. No wheezing, rales or rhonchi. Abdominal: Soft, nondistended, nontender. Bowel sounds active throughout. There are no masses palpable. No hepatomegaly. Neurological: Alert and oriented to person place and time. Skin: Skin is warm and dry. No rashes noted.  Tye Savoy, NP  07/16/2021, 10:43 AM  I spent 30 minutes total reviewing records, obtaining history, performing exam, counseling patient and documenting visit / findings.    Cc:  Referring Provider Hayden Rasmussen, MD

## 2021-07-16 NOTE — Patient Instructions (Addendum)
If you are age 40 or younger, your body mass index should be between 19-25. Your Body mass index is 50.64 kg/m. If this is out of the aformentioned range listed, please consider follow up with your Primary Care Provider.  ________________________________________________________  The [facility] providers would like to encourage you to use [facility] to communicate with providers for non-urgent requests or questions.  Due to long hold times on the telephone, sending your provider a message by [facility] may be a faster and more efficient way to get a response.  Please allow 48 business hours for a response.  Please remember that this is for non-urgent requests.  _______________________________________________________  We will contact you once Dr. Hilarie Fredrickson has reviewed your Ct.  Thank you for entrusting me with your care and choosing [facility].  Roanna Raider, NP-C

## 2021-07-20 NOTE — Progress Notes (Signed)
I agree with the above note, plan.  Unusual to have acute pancreatitis but a normal lipase.  Her previous scan was without IV contrast and so I think she definitely needs better pancreatic imaging.  Can you set up pancreatic protocol CT scan with IV and oral contrast?  Thank you

## 2021-07-22 ENCOUNTER — Other Ambulatory Visit: Payer: Self-pay

## 2021-07-22 DIAGNOSIS — K859 Acute pancreatitis without necrosis or infection, unspecified: Secondary | ICD-10-CM

## 2021-07-23 ENCOUNTER — Ambulatory Visit (HOSPITAL_BASED_OUTPATIENT_CLINIC_OR_DEPARTMENT_OTHER)
Admission: RE | Admit: 2021-07-23 | Discharge: 2021-07-23 | Disposition: A | Discharge status: Home / Self Care | Payer: Medicaid Other | Source: Ambulatory Visit | Attending: Nurse Practitioner | Admitting: Nurse Practitioner

## 2021-07-23 ENCOUNTER — Other Ambulatory Visit: Payer: Self-pay

## 2021-07-23 DIAGNOSIS — K859 Acute pancreatitis without necrosis or infection, unspecified: Secondary | ICD-10-CM | POA: Insufficient documentation

## 2021-07-23 MED ORDER — IOHEXOL 300 MG/ML  SOLN
100.0000 mL | Freq: Once | INTRAMUSCULAR | Status: AC | PRN
  Administered 2021-07-23: 100 mL via INTRAVENOUS

## 2021-07-29 ENCOUNTER — Other Ambulatory Visit: Payer: Self-pay

## 2021-07-30 ENCOUNTER — Encounter: Payer: Medicaid Other | Admitting: Gastroenterology

## 2021-07-30 ENCOUNTER — Other Ambulatory Visit: Payer: Self-pay

## 2021-07-30 ENCOUNTER — Telehealth: Payer: Self-pay | Admitting: Nurse Practitioner

## 2021-07-30 DIAGNOSIS — R1011 Right upper quadrant pain: Secondary | ICD-10-CM

## 2021-07-30 NOTE — Telephone Encounter (Signed)
Inbound call from patient states she would like t have 2/16 appt at hospital if it is still available.

## 2021-07-30 NOTE — Telephone Encounter (Signed)
Spoke with Alexis Howell. Explained the 2/16 date was my error. There was not an opening. Dr Christella Hartigan typically cannot schedule past 11:00 am. I apologized for misleading them. He states he and the patient are anxious to get this taken care of.

## 2021-09-01 ENCOUNTER — Encounter (HOSPITAL_COMMUNITY): Payer: Self-pay | Admitting: Gastroenterology

## 2021-09-01 NOTE — Progress Notes (Signed)
Attempted to obtain medical history via telephone, unable to reach at this time. I left a voicemail to return pre surgical testing department's phone call.  

## 2021-09-09 ENCOUNTER — Other Ambulatory Visit: Payer: Self-pay

## 2021-09-09 ENCOUNTER — Ambulatory Visit (HOSPITAL_BASED_OUTPATIENT_CLINIC_OR_DEPARTMENT_OTHER): Payer: Medicaid Other | Admitting: Registered Nurse

## 2021-09-09 ENCOUNTER — Ambulatory Visit (HOSPITAL_COMMUNITY): Payer: Medicaid Other | Admitting: Registered Nurse

## 2021-09-09 ENCOUNTER — Ambulatory Visit (HOSPITAL_COMMUNITY)
Admission: RE | Admit: 2021-09-09 | Discharge: 2021-09-09 | Disposition: A | Discharge status: Home / Self Care | Payer: Medicaid Other | Source: Ambulatory Visit | Attending: Gastroenterology | Admitting: Gastroenterology

## 2021-09-09 ENCOUNTER — Encounter (HOSPITAL_COMMUNITY)
Admission: RE | Disposition: A | Discharge status: Home / Self Care | Payer: Self-pay | Source: Ambulatory Visit | Attending: Gastroenterology

## 2021-09-09 ENCOUNTER — Encounter (HOSPITAL_COMMUNITY): Payer: Self-pay | Admitting: Gastroenterology

## 2021-09-09 DIAGNOSIS — K319 Disease of stomach and duodenum, unspecified: Secondary | ICD-10-CM | POA: Diagnosis not present

## 2021-09-09 DIAGNOSIS — N289 Disorder of kidney and ureter, unspecified: Secondary | ICD-10-CM | POA: Diagnosis not present

## 2021-09-09 DIAGNOSIS — E669 Obesity, unspecified: Secondary | ICD-10-CM | POA: Insufficient documentation

## 2021-09-09 DIAGNOSIS — R1012 Left upper quadrant pain: Secondary | ICD-10-CM | POA: Diagnosis present

## 2021-09-09 DIAGNOSIS — K2970 Gastritis, unspecified, without bleeding: Secondary | ICD-10-CM | POA: Diagnosis not present

## 2021-09-09 DIAGNOSIS — Z87891 Personal history of nicotine dependence: Secondary | ICD-10-CM | POA: Insufficient documentation

## 2021-09-09 DIAGNOSIS — Z6841 Body Mass Index (BMI) 40.0 and over, adult: Secondary | ICD-10-CM

## 2021-09-09 DIAGNOSIS — R1011 Right upper quadrant pain: Secondary | ICD-10-CM

## 2021-09-09 HISTORY — PX: BIOPSY: SHX5522

## 2021-09-09 HISTORY — PX: ESOPHAGOGASTRODUODENOSCOPY (EGD) WITH PROPOFOL: SHX5813

## 2021-09-09 SURGERY — ESOPHAGOGASTRODUODENOSCOPY (EGD) WITH PROPOFOL
Anesthesia: Monitor Anesthesia Care

## 2021-09-09 MED ORDER — PROPOFOL 500 MG/50ML IV EMUL
INTRAVENOUS | Status: DC | PRN
Start: 2021-09-09 — End: 2021-09-09
  Administered 2021-09-09: 400 ug/kg/min via INTRAVENOUS

## 2021-09-09 MED ORDER — SODIUM CHLORIDE 0.9 % IV SOLN: INTRAVENOUS | Status: DC

## 2021-09-09 MED ORDER — LACTATED RINGERS IV SOLN
INTRAVENOUS | Status: DC | PRN
Start: 2021-09-09 — End: 2021-09-09

## 2021-09-09 SURGICAL SUPPLY — 15 items

## 2021-09-09 NOTE — H&P (Signed)
?HPI: ?This is a woman with intermittent upper abd pains ? ?ROS: complete GI ROS as described in HPI, all other review negative. ? ?Constitutional:  No unintentional weight loss ? ? ?Past Medical History:  ?Diagnosis Date  ? Delivery of pregnancy by cesarean section: Indication: arrest of dilation 11/26/2016  ? Hyperlipemia   ? Kidney stones   ? Obesity   ? Pancreatitis   ? PCOS (polycystic ovarian syndrome)   ? Prolonged first stage (of labor) 11/26/2016  ? ? ?Past Surgical History:  ?Procedure Laterality Date  ? CESAREAN SECTION N/A 11/26/2016  ? Procedure: CESAREAN SECTION;  Surgeon: Aloha Gell, MD;  Location: [facility];  Service: Obstetrics;  Laterality: N/A;  ? CHOLECYSTECTOMY N/A 08/12/2014  ? Procedure: LAPAROSCOPIC CHOLECYSTECTOMY WITH INTRAOPERATIVE CHOLANGIOGRAM;  Surgeon: Gayland Curry, MD;  Location: [facility];  Service: General;  Laterality: N/A;  ? TONSILLECTOMY AND ADENOIDECTOMY    ? WISDOM TOOTH EXTRACTION    ? ? ?Current Outpatient Medications  ?Medication Instructions  ? ASHWAGANDHA PO 1,000 mg, Oral, Daily  ? Barberry-Oreg Grape-Goldenseal (BERBERINE COMPLEX PO) 200 mg, Oral, Daily, Turmeric  ? Red Yeast Rice Extract (RED YEAST RICE PO) 1,215 mg, Oral, Daily, Co Q 10  ? spironolactone (ALDACTONE) 50 mg, Oral, Daily  ? ? ?Allergies as of 07/30/2021  ? (No Known Allergies)  ? ? ?Family History  ?Problem Relation Age of Onset  ? Heart disease Mother   ? Seizures Sister   ? Mental illness Paternal Grandmother   ? ? ?Social History  ? ?Socioeconomic History  ? Marital status: Married  ?  Spouse name: Salome Arnt  ? Number of children: 1  ? Years of education: Not on file  ? Highest education level: Master's degree (e.g., MA, MS, MEng, MEd, MSW, MBA)  ?Occupational History  ? 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  Employer: [employer]  ?Tobacco Use  ? Smoking status: Former  ?  Types: Cigarettes  ?  Quit date: 07/30/2009  ?  Years since quitting: 12.1  ? Smokeless tobacco: Never   ?Vaping Use  ? Vaping Use: Never used  ?Substance and Sexual Activity  ? Alcohol use: Yes  ?  Alcohol/week: 0.0 standard drinks  ?  Comment: OCCASIONAL  ? Drug use: No  ? Sexual activity: Yes  ?  Birth control/protection: None  ?Other Topics Concern  ? Not on file  ?Social History Narrative  ? Master's degree in [place] and Rec from [employer].  ? Lives with her husband, their son (born 11/26/2016) and dog and cat.  ? Family is spread out: parents in [place], sister in [place], born in [place]).  ? Husband's family is local.  ? ?Social Determinants of Health  ? ?Financial Resource Strain: Not on file  ?Food Insecurity: Not on file  ?Transportation Needs: Not on file  ?Physical Activity: Not on file  ?Stress: Not on file  ?Social Connections: Not on file  ?Intimate Partner Violence: Not on file  ? ? ? ?Physical Exam: ?BP 135/66   Pulse 84   Temp 97.6 ?F (36.4 ?C) (Temporal)   Resp 19   Ht 5\' 4"  (1.626 m)   Wt 133.8 kg   SpO2 99%   BMI 50.64 kg/m?  ?Constitutional: generally well-appearing ?Psychiatric: alert and oriented x3 ?Abdomen: soft, nontender, nondistended, no obvious ascites, no peritoneal signs, normal bowel sounds ?No peripheral edema noted in lower extremities ? ?Assessment and plan: ?41 y.o. female with intermittent upper abd pains ? ?EGD otday ? ?  Please see the "Patient Instructions" section for addition details about the plan. ? ?Owens Loffler, MD ?[facility] ?09/09/2021, 9:24 AM ? ? ?

## 2021-09-09 NOTE — Discharge Instructions (Signed)
YOU HAD AN ENDOSCOPIC PROCEDURE TODAY: Refer to the procedure report and other information in the discharge instructions given to you for any specific questions about what was found during the examination. If this information does not answer your questions, please call [place] office at [phone] to clarify.  ° °YOU SHOULD EXPECT: Some feelings of bloating in the abdomen. Passage of more gas than usual. Walking can help get rid of the air that was put into your GI tract during the procedure and reduce the bloating. If you had a lower endoscopy (such as a colonoscopy or flexible sigmoidoscopy) you may notice spotting of blood in your stool or on the toilet paper. Some abdominal soreness may be present for a day or two, also. ° °DIET: Your first meal following the procedure should be a light meal and then it is ok to progress to your normal diet. A half-sandwich or bowl of soup is an example of a good first meal. Heavy or fried foods are harder to digest and may make you feel nauseous or bloated. Drink plenty of fluids but you should avoid alcoholic beverages for 24 hours. If you had a esophageal dilation, please see attached instructions for diet.   ° °ACTIVITY: Your care partner should take you home directly after the procedure. You should plan to take it easy, moving slowly for the rest of the day. You can resume normal activity the day after the procedure however YOU SHOULD NOT DRIVE, use power tools, machinery or perform tasks that involve climbing or major physical exertion for 24 hours (because of the sedation medicines used during the test).  ° °SYMPTOMS TO REPORT IMMEDIATELY: °A gastroenterologist can be reached at any hour. Please call [phone]  for any of the following symptoms:  °Following lower endoscopy (colonoscopy, flexible sigmoidoscopy) °Excessive amounts of blood in the stool  °Significant tenderness, worsening of abdominal pains  °Swelling of the abdomen that is new, acute  °Fever of 100° or  higher  °Following upper endoscopy (EGD, EUS, ERCP, esophageal dilation) °Vomiting of blood or coffee ground material  °New, significant abdominal pain  °New, significant chest pain or pain under the shoulder blades  °Painful or persistently difficult swallowing  °New shortness of breath  °Black, tarry-looking or red, bloody stools ° °FOLLOW UP:  °If any biopsies were taken you will be contacted by phone or by letter within the next 1-3 weeks. Call [phone]  if you have not heard about the biopsies in 3 weeks.  °Please also call with any specific questions about appointments or follow up tests. ° °

## 2021-09-09 NOTE — Op Note (Signed)
[facility] ?Patient Name: Alexis Howell ?Procedure Date: 09/09/2021 ?MRN: [id number] ?Attending MD: Milus Banister , MD ?Date of Birth: 11/29/1980 ?CSN: [id number] ?Age: 41 ?Admit Type: Outpatient ?Procedure:                Upper GI endoscopy ?Indications:              Abdominal pain in the right upper quadrant,  ?                          Abdominal pain in the left upper quadrant ?Providers:                Milus Banister, MD, Carmie End, RN,  ?                          William Dalton, Technician ?Referring MD:              ?Medicines:                Monitored Anesthesia Care ?Complications:            No immediate complications. Estimated blood loss:  ?                          None. ?Estimated Blood Loss:     Estimated blood loss: none. ?Procedure:                Pre-Anesthesia Assessment: ?                          - Prior to the procedure, a History and Physical  ?                          was performed, and patient medications and  ?                          allergies were reviewed. The patient's tolerance of  ?                          previous anesthesia was also reviewed. The risks  ?                          and benefits of the procedure and the sedation  ?                          options and risks were discussed with the patient.  ?                          All questions were answered, and informed consent  ?                          was obtained. Prior Anticoagulants: The patient has  ?                          taken no previous anticoagulant or antiplatelet  ?  agents. ASA Grade Assessment: III - A patient with  ?                          severe systemic disease. After reviewing the risks  ?                          and benefits, the patient was deemed in  ?                          satisfactory condition to undergo the procedure. ?                          After obtaining informed consent, the endoscope was  ?                          passed under  direct vision. Throughout the  ?                          procedure, the patient's blood pressure, pulse, and  ?                          oxygen saturations were monitored continuously. The  ?                          [id number]) Olympus endoscope was introduced  ?                          through the mouth, and advanced to the second part  ?                          of duodenum. The upper GI endoscopy was  ?                          accomplished without difficulty. The patient  ?                          tolerated the procedure well. ?Findings: ?     Mild inflammation characterized by erythema and granularity was found in  ?     the gastric antrum. Biopsies were taken with a cold forceps for  ?     histology. ?     The exam was otherwise without abnormality. ?Impression:               - Mild, non-specific gastritis. Biopsied to check  ?                          for H. pylori. ?                          - The examination was otherwise normal. ?Moderate Sedation: ?     Not Applicable - Patient had care per Anesthesia. ?Recommendation:           - Patient has a contact number available for  ?  emergencies. The signs and symptoms of potential  ?                          delayed complications were discussed with the  ?                          patient. Return to normal activities tomorrow.  ?                          Written discharge instructions were provided to the  ?                          patient. ?                          - Resume previous diet. ?                          - Continue present medications. ?                          - Await pathology results. ?Procedure Code(s):        --- Professional --- ?                          [phone], Esophagogastroduodenoscopy, flexible,  ?                          transoral; with biopsy, single or multiple ?Diagnosis Code(s):        --- Professional --- ?                          K29.70, Gastritis, unspecified, without bleeding ?                           R10.11, Right upper quadrant pain ?                          R10.12, Left upper quadrant pain ?CPT copyright 2019 American Medical Association. All rights reserved. ?The codes documented in this report are preliminary and upon coder review may  ?be revised to meet current compliance requirements. ?Milus Banister, MD ?09/09/2021 9:20:13 AM ?This report has been signed electronically. ?Number of Addenda: 0 ?

## 2021-09-09 NOTE — Anesthesia Postprocedure Evaluation (Signed)
Anesthesia Post Note ? ?Patient: GAIL VENDETTI ? ?Procedure(s) Performed: ESOPHAGOGASTRODUODENOSCOPY (EGD) WITH PROPOFOL ?BIOPSY ? ?  ? ?Patient location during evaluation: PACU ?Anesthesia Type: MAC ?Level of consciousness: awake and alert ?Pain management: pain level controlled ?Vital Signs Assessment: post-procedure vital signs reviewed and stable ?Respiratory status: spontaneous breathing, nonlabored ventilation, respiratory function stable and patient connected to nasal cannula oxygen ?Cardiovascular status: stable and blood pressure returned to baseline ?Postop Assessment: no apparent nausea or vomiting ?Anesthetic complications: no ? ? ?No notable events documented. ? ?Last Vitals:  ?Vitals:  ? 09/09/21 0937 09/09/21 0947  ?BP: 132/87 (!) 141/72  ?Pulse: 93 74  ?Resp: 20 17  ?Temp:    ?SpO2: 100% 99%  ?  ?Last Pain:  ?Vitals:  ? 09/09/21 0947  ?TempSrc:   ?PainSc: 0-No pain  ? ? ?  ?  ?  ?  ?  ?  ? ?Effie Berkshire ? ? ? ? ?

## 2021-09-09 NOTE — Anesthesia Preprocedure Evaluation (Addendum)
Anesthesia Evaluation  ?Patient identified by MRN, date of birth, ID band ?Patient awake ? ? ? ?Reviewed: ?Allergy & Precautions, NPO status , Patient's Chart, lab work & pertinent test results ? ?Airway ?Mallampati: IV ? ?TM Distance: >3 FB ?Neck ROM: Full ? ? ? Dental ? ?(+) Teeth Intact, Dental Advisory Given ?  ?Pulmonary ?former smoker,  ?  ?breath sounds clear to auscultation ? ? ? ? ? ? Cardiovascular ?negative cardio ROS ? ? ?Rhythm:Regular Rate:Normal ? ? ?  ?Neuro/Psych ?negative neurological ROS ? negative psych ROS  ? GI/Hepatic ?negative GI ROS, Neg liver ROS,   ?Endo/Other  ?negative endocrine ROS ? Renal/GU ?Renal disease  ? ?  ?Musculoskeletal ?negative musculoskeletal ROS ?(+)  ? Abdominal ?(+) + obese,   ?Peds ? Hematology ?negative hematology ROS ?(+)   ?Anesthesia Other Findings ? ? Reproductive/Obstetrics ? ?  ? ? ? ? ? ? ? ? ? ? ? ? ? ?  ?  ? ? ? ? ? ? ? ?Anesthesia Physical ?Anesthesia Plan ? ?ASA: 3 ? ?Anesthesia Plan: MAC  ? ?Post-op Pain Management:   ? ?Induction: Intravenous ? ?PONV Risk Score and Plan: 0 and Propofol infusion ? ?Airway Management Planned: Natural Airway and Simple Face Mask ? ?Additional Equipment: None ? ?Intra-op Plan:  ? ?Post-operative Plan:  ? ?Informed Consent: I have reviewed the patients History and Physical, chart, labs and discussed the procedure including the risks, benefits and alternatives for the proposed anesthesia with the patient or authorized representative who has indicated his/her understanding and acceptance.  ? ? ? ? ? ?Plan Discussed with: CRNA ? ?Anesthesia Plan Comments:   ? ? ? ? ? ?Anesthesia Quick Evaluation ? ?

## 2021-09-09 NOTE — Transfer of Care (Signed)
Immediate Anesthesia Transfer of Care Note ? ?Patient: Alexis Howell ? ?Procedure(s) Performed: ESOPHAGOGASTRODUODENOSCOPY (EGD) WITH PROPOFOL ?BIOPSY ? ?Patient Location: PACU ? ?Anesthesia Type:MAC ? ?Level of Consciousness: awake, alert , oriented and patient cooperative ? ?Airway & Oxygen Therapy: Patient Spontanous Breathing and Patient connected to face mask oxygen ? ?Post-op Assessment: Report given to RN, Post -op Vital signs reviewed and stable and Patient moving all extremities X 4 ? ?Post vital signs: stable ? ?Last Vitals:  ?Vitals Value Taken Time  ?BP 132/87 09/09/21 0930  ?Temp 36.4 ?C 09/09/21 0927  ?Pulse 94 09/09/21 0931  ?Resp 22 09/09/21 0931  ?SpO2 99 % 09/09/21 0931  ?Vitals shown include unvalidated device data. ? ?Last Pain:  ?Vitals:  ? 09/09/21 0927  ?TempSrc: Tympanic  ?PainSc: 0-No pain  ?   ? ?  ? ?Complications: No notable events documented. ?

## 2021-09-09 NOTE — Anesthesia Procedure Notes (Signed)
Procedure Name: [place] ?Date/Time: 09/09/2021 9:08 AM ?Performed by: Lissa Morales, CRNA ?Pre-anesthesia Checklist: Patient identified, Emergency Drugs available, Suction available and Patient being monitored ?Patient Re-evaluated:Patient Re-evaluated prior to induction ?Oxygen Delivery Method: Simple face mask ?Preoxygenation: Pre-oxygenation with 100% oxygen ?Placement Confirmation: positive ETCO2 ? ? ? ? ?

## 2021-09-10 ENCOUNTER — Encounter (HOSPITAL_COMMUNITY): Payer: Self-pay | Admitting: Gastroenterology

## 2021-09-10 LAB — SURGICAL PATHOLOGY

## 2021-12-17 ENCOUNTER — Ambulatory Visit: Payer: Medicaid Other | Attending: Family Medicine

## 2021-12-17 DIAGNOSIS — M6281 Muscle weakness (generalized): Secondary | ICD-10-CM | POA: Diagnosis present

## 2021-12-17 DIAGNOSIS — R262 Difficulty in walking, not elsewhere classified: Secondary | ICD-10-CM | POA: Diagnosis present

## 2021-12-17 DIAGNOSIS — M25561 Pain in right knee: Secondary | ICD-10-CM | POA: Diagnosis present

## 2021-12-17 NOTE — Therapy (Signed)
OUTPATIENT PHYSICAL THERAPY LOWER EXTREMITY EVALUATION   Patient Name: Alexis Howell MRN: [id number] DOB:02-03-1981, 41 y.o., female Today's Date: 12/17/2021   PT End of Session - 12/17/21 1214     Visit Number 1    Number of Visits 12    Date for PT Re-Evaluation 02/11/22    Authorization Type [employer]    PT Start Time 1020    PT Stop Time 1104    PT Time Calculation (min) 44 min    Activity Tolerance Patient tolerated treatment well    Behavior During Therapy [facility] for tasks assessed/performed             Past Medical History:  Diagnosis Date   Delivery of pregnancy by cesarean section: Indication: arrest of dilation 11/26/2016   Hyperlipemia    Kidney stones    Obesity    Pancreatitis    PCOS (polycystic ovarian syndrome)    Prolonged first stage (of labor) 11/26/2016   Past Surgical History:  Procedure Laterality Date   BIOPSY  09/09/2021   Procedure: BIOPSY;  Surgeon: Rachael Fee, MD;  Location: [facility];  Service: Endoscopy;;   CESAREAN SECTION N/A 11/26/2016   Procedure: CESAREAN SECTION;  Surgeon: Noland Fordyce, MD;  Location: [facility] SUIT[facility];  Service: Obstetrics;  Laterality: N/A;   CHOLECYSTECTOMY N/A 08/12/2014   Procedure: LAPAROSCOPIC CHOLECYSTECTOMY WITH INTRAOPERATIVE CHOLANGIOGRAM;  Surgeon: Atilano Ina, MD;  Location: [facility];  Service: General;  Laterality: N/A;   ESOPHAGOGASTRODUODENOSCOPY (EGD) WITH PROPOFOL N/A 09/09/2021   Procedure: ESOPHAGOGASTRODUODENOSCOPY (EGD) WITH PROPOFOL;  Surgeon: Rachael Fee, MD;  Location: [facility];  Service: Endoscopy;  Laterality: N/A;   TONSILLECTOMY AND ADENOIDECTOMY     WISDOM TOOTH EXTRACTION     Patient Active Problem List   Diagnosis Date Noted   Unspecified ovarian cyst, left side 07/07/2021   Polycystic ovaries 07/07/2021   Hx of pancreatitis 07/07/2021   BMI 45.0-49.9, adult (HCC) 10/03/2017    PCP: Nadyne Coombes, MD  REFERRING PROVIDER: Nadyne Coombes, MD  REFERRING  DIAG: (R) Knee Sprain  THERAPY DIAG:  Acute pain of right knee  Difficulty in walking, not elsewhere classified  Muscle weakness (generalized)  Rationale for Evaluation and Treatment Rehabilitation  ONSET DATE: November 29, 2021  SUBJECTIVE:   SUBJECTIVE STATEMENT: Pt reports feeling like something "felt loose" in her knee on 11/29/21. She had recently started working out 2-3 days/week for about 5 weeks, and just woke up feeling this way. By Friday, she couldn't really move her leg, it was so debilitating. She ended up in urgent care and went to the hospital for imaging. She prescribed an NSAID and prednisone and was prescribed rest for a few days. She had initially tried a sleeve, icing 1-2x/day, Ibuprofen, and epsom salt baths. Walking was not painful, but flexion was. She got in to see her doctor the next Thursday or so and the swelling had gone down. Her doctor thought nothing was "wrong with the connectors", but something was wrong so she was sent to PT. Her knee just feels tight in some places and loose in some places. Until Sunday, her knee would give out going up/down stairs, getting up and down and all unpredictably. She was using a FWW initially with a band to help advance her RLE.  PERTINENT HISTORY: L Knee injury early 2000's - no trouble now  PAIN:  Are you having pain? Yes: NPRS scale: Present: 0/10, was at worst: 10/10 Pain location: R knee Pain description: was white  hot Aggravating factors: Stairs, sit <> stand,  Relieving factors: PRICE to some extent, initially Prednisone  PRECAUTIONS: None  WEIGHT BEARING RESTRICTIONS No  FALLS:  Has patient fallen in last 6 months? No  LIVING ENVIRONMENT: Lives with: lives with their family Lives in: House/apartment Stairs: Yes: Internal: 6-8 x 2 split level steps; can reach both and External: 2 steps; none Has following equipment at home: Environmental consultant - 2 wheeled  OCCUPATION: Self-employed, mostly computer work, Education officer, environmental working  business  PLOF: Independent  PATIENT GOALS Back to gym, bungee aerobics   OBJECTIVE:   DIAGNOSTIC FINDINGS: R Knee XR: no acute osseous abnormality, small joint effusion  PATIENT SURVEYS:  LEFS 75/80  COGNITION:  Overall cognitive status: Within functional limits for tasks assessed     SENSATION: WFL  EDEMA:  Circumferential: 17.5 in B  MUSCLE LENGTH: Hamstrings: Right NA deg; Left NA deg Thomas test: Right NA deg; Left NA deg  POSTURE: No Significant postural limitations  PALPATION: unremarkable  LOWER EXTREMITY ROM:  Active ROM Right eval Left eval  Hip flexion    Hip extension    Hip abduction    Hip adduction    Hip internal rotation    Hip external rotation    Knee flexion 120 121  Knee extension 3 3  Ankle dorsiflexion    Ankle plantarflexion    Ankle inversion    Ankle eversion     (Blank rows = not tested)  LOWER EXTREMITY MMT:  MMT Right eval Left eval  Hip flexion    Hip extension    Hip abduction    Hip adduction    Hip internal rotation    Hip external rotation    Knee flexion    Knee extension    Ankle dorsiflexion    Ankle plantarflexion    Ankle inversion    Ankle eversion     (Blank rows = not tested)  LOWER EXTREMITY SPECIAL TESTS:  Knee special tests: Valgus/varus stress (-), flexion and extension with OP (-)  FUNCTIONAL TESTS:  Squatting: B hip ER with R knee audibly cracking during concentric phase  GAIT: Distance walked: 50 ft Assistive device utilized: None Level of assistance: Complete Independence Comments: WNL    TODAY'S TREATMENT: See HEP below   PATIENT EDUCATION:  Education details: Diagnosis, Prognosis, HEP, POC, LEFS Person educated: Patient Education method: Explanation, Demonstration, Tactile cues, Verbal cues, and Handouts Education comprehension: verbalized understanding, returned demonstration, verbal cues required, and tactile cues required   HOME EXERCISE PROGRAM: Access Code:  [email or web address] URL: [email or web address] Date: 12/17/2021 Prepared by: Gardiner Rhyme  Exercises - Supine Quad Set  - 2 x daily - 7 x weekly - 2 sets - 10 reps - 5 seconds hold - Active Straight Leg Raise with Quad Set  - 1-2 x daily - 7 x weekly - 1-2 sets - 10 reps - Supine Bridge  - 1-2 x daily - 7 x weekly - 1-2 sets - 10 reps - 5 seconds hold - Hooklying Isometric Clamshell  - 1-2 x daily - 7 x weekly - 1-2 sets - 10 reps  ASSESSMENT:  CLINICAL IMPRESSION: Patient is a 41 y.o. female who was seen today for physical therapy evaluation and treatment for R knee pain that started after introduction of gym routine 2-3x/week for ~5 weeks. Pt has done bungee aerobics in the past with decreased weight bearing and had no issues. She admittedly has a tendency to overdo it, and was educated to back off of her gym  routine for now to focus on Physical Therapy. She demonstrates hip/core weakness with bridging and clamshells, as well as quick fatigue in quadriceps with straight leg raise. She exhibited an audible crack in R knee during concentric portion of squat that reportedly did not cause pain. Pt was not in pain today or experiencing symptoms like she did right after initial injury, but she has been resting for about a week now. She lives in a split level home and negotiates steps throughout the day, which can aggravate her knee. She had not swelling today, and was educated on potential for swelling in knee to cause feeling of giving way. Pt additionally educated on diagnosis of knee sprain, prognosis, LEFS, POC, and HEP verbalizing understanding and consent to tx. She would benefit from skilled PT to safely strengthen BLE and progress into exercise routine.   OBJECTIVE IMPAIRMENTS decreased strength, impaired perceived functional ability, improper body mechanics, and pain.   ACTIVITY LIMITATIONS squatting and stairs  PARTICIPATION LIMITATIONS: community activity  PERSONAL FACTORS Fitness and  1-2 comorbidities: HLD, kidney disease  are also affecting patient's functional outcome.   REHAB POTENTIAL: Good  CLINICAL DECISION MAKING: Stable/uncomplicated  EVALUATION COMPLEXITY: Low   GOALS: Goals reviewed with patient? No  SHORT TERM GOALS: Target date: 01/07/2022  Pt will be independent with initial HEP. Baseline: given at eval Goal status: INITIAL 2.  LE MMT will be assessed with LTG created as appropriate. Baseline: not taken Goal status: INITIAL   LONG TERM GOALS: Target date: 02/11/2022   Pt will be independent with advanced HEP to continue with progression of strength. Baseline: not provided yet Goal status: INITIAL  2.  Pt will increase LEFS to 80/80, demonstrating no functional deficits. Baseline: 75/80 Goal status: INITIAL  3.  Pt will return to gym routine with no R knee pain or issues.  Baseline: not going currently. Goal status: INITIAL  4.  Pt will negotiate stairs safely with no pain of feeling of knee instability. Baseline: has felt instability  Goal status: INITIAL    PLAN: PT FREQUENCY: 1-2x/week  PT DURATION: 8 weeks  PLANNED INTERVENTIONS: Therapeutic exercises, Therapeutic activity, Neuromuscular re-education, Balance training, Gait training, Patient/Family education, Joint mobilization, Stair training, Dry Needling, Cryotherapy, Moist heat, Taping, Manual therapy, and Re-evaluation  PLAN FOR NEXT SESSION: LE MMT, assess HEP/update PRN, progress LE/hip strength, balance and further functional testing as deemed appropriate, modalities and manual PRN   Izell Boneau, PT, DPT 12/17/2021, 12:15 PM  Check all possible CPT codes: [phone] - PT Re-evaluation, 97110- Therapeutic Exercise, [phone]- Neuro Re-education, [phone] - Gait Training, [phone] - Manual Therapy, 97530 - Therapeutic Activities, and 97535 - Self Care     If treatment provided at initial evaluation, no treatment charged due to lack of authorization.

## 2021-12-23 NOTE — Therapy (Signed)
OUTPATIENT PHYSICAL THERAPY TREATMENT NOTE   Patient Name: Alexis Howell MRN: [id number] DOB:August 02, 1980, 41 y.o., female Today's Date: 12/24/2021  PCP: Nadyne Coombes, MD REFERRING PROVIDER: Nadyne Coombes, MD  END OF SESSION:   PT End of Session - 12/24/21 1152     Visit Number 2    Number of Visits 12    Date for PT Re-Evaluation 02/11/22    Authorization Type [employer]    PT Start Time 1145    PT Stop Time 1230    PT Time Calculation (min) 45 min    Activity Tolerance Patient tolerated treatment well    Behavior During Therapy [facility] for tasks assessed/performed             Past Medical History:  Diagnosis Date   Delivery of pregnancy by cesarean section: Indication: arrest of dilation 11/26/2016   Hyperlipemia    Kidney stones    Obesity    Pancreatitis    PCOS (polycystic ovarian syndrome)    Prolonged first stage (of labor) 11/26/2016   Past Surgical History:  Procedure Laterality Date   BIOPSY  09/09/2021   Procedure: BIOPSY;  Surgeon: Rachael Fee, MD;  Location: [facility];  Service: Endoscopy;;   CESAREAN SECTION N/A 11/26/2016   Procedure: CESAREAN SECTION;  Surgeon: Noland Fordyce, MD;  Location: [facility]TH[facility] SUIT[facility];  Service: Obstetrics;  Laterality: N/A;   CHOLECYSTECTOMY N/A 08/12/2014   Procedure: LAPAROSCOPIC CHOLECYSTECTOMY WITH INTRAOPERATIVE CHOLANGIOGRAM;  Surgeon: Atilano Ina, MD;  Location: [facility];  Service: General;  Laterality: N/A;   ESOPHAGOGASTRODUODENOSCOPY (EGD) WITH PROPOFOL N/A 09/09/2021   Procedure: ESOPHAGOGASTRODUODENOSCOPY (EGD) WITH PROPOFOL;  Surgeon: Rachael Fee, MD;  Location: [facility];  Service: Endoscopy;  Laterality: N/A;   TONSILLECTOMY AND ADENOIDECTOMY     WISDOM TOOTH EXTRACTION     Patient Active Problem List   Diagnosis Date Noted   Unspecified ovarian cyst, left side 07/07/2021   Polycystic ovaries 07/07/2021   Hx of pancreatitis 07/07/2021   BMI 45.0-49.9, adult (HCC) 10/03/2017     REFERRING DIAG: (R) Knee Sprain  THERAPY DIAG:  Acute pain of right knee  Difficulty in walking, not elsewhere classified  Muscle weakness (generalized)  Rationale for Evaluation and Treatment Rehabilitation  PERTINENT HISTORY: L Knee injury early 2000's - no trouble now  PRECAUTIONS: None  SUBJECTIVE: I've been doing good and haven't had any pain lately.   PAIN:  Are you having pain? Yes: NPRS scale: Present: 0/10 (currently) Pain location: R knee Pain description: was white hot Aggravating factors: Stairs, sit <> stand,  Relieving factors: PRICE to some extent, initially Prednisone   OBJECTIVE: (objective measures completed at initial evaluation unless otherwise dated)   DIAGNOSTIC FINDINGS: R Knee XR: no acute osseous abnormality, small joint effusion   PATIENT SURVEYS:  LEFS 75/80   COGNITION:           Overall cognitive status: Within functional limits for tasks assessed                          SENSATION: WFL   EDEMA:  Circumferential: 17.5 in B   MUSCLE LENGTH: Hamstrings: Right NA deg; Left NA deg Thomas test: Right NA deg; Left NA deg   POSTURE: No Significant postural limitations   PALPATION: unremarkable   LOWER EXTREMITY ROM:   Active ROM Right eval Left eval  Hip flexion      Hip extension      Hip abduction  Hip adduction      Hip internal rotation      Hip external rotation      Knee flexion 120 121  Knee extension 3 3  Ankle dorsiflexion      Ankle plantarflexion      Ankle inversion      Ankle eversion       (Blank rows = not tested)   LOWER EXTREMITY MMT:   MMT Right eval Left eval  Hip flexion      Hip extension      Hip abduction      Hip adduction      Hip internal rotation      Hip external rotation      Knee flexion      Knee extension      Ankle dorsiflexion      Ankle plantarflexion      Ankle inversion      Ankle eversion       (Blank rows = not tested)   LOWER EXTREMITY SPECIAL TESTS:  Knee  special tests: Valgus/varus stress (-), flexion and extension with OP (-)   FUNCTIONAL TESTS:  Squatting: B hip ER with R knee audibly cracking during concentric phase   GAIT: Distance walked: 50 ft Assistive device utilized: None Level of assistance: Complete Independence Comments: WNL       TODAY'S TREATMENT: [facility] Adult PT Treatment:                                                DATE: 12/23/2021 Aquatic therapy at [facility]- [address] - therapeutic pool temp 91 degrees Pt enters building ambulating independently.  Treatment took place in water 3.8 to  4 ft 8 in.feet deep depending upon activity.  Pt entered and exited the pool via stair and handrails independently.   Pt pain level 0/10 at initiation of water walking.  Therapeutic Exercise: Walking forward/backwards/side stepping Lunge stepping forwards x2 laps Side stepping lunge x2 laps Runners stretch on bottom step x30" BIL Hamstring stretch on bottom step x30" BIL Figure 4 squat stretch, BIL UE support x30" BIL At edge of pool, pt performed LE exercise: Hip abd/add x20 BIL Hip ext/flex with knee straight x 20 BIL Hip Circles CC/CCW 2x10 each BIL Marching hip flexion to knee extension 2x10 BIL Hamstring curl x20 BIL Squats 2x20 Heel/toe raises x20 Sitting on yellow noodle with 2 yellow dumbbells at sides: Bicycle kicks x1' Reverse bicycle kicks x1' Flutter kicks x1' Scissor kicks x1' Neuromuscular re-ed: Tandem stance x30" BIL Pt requires the buoyancy of water for active assisted exercises with buoyancy supported for strengthening and AROM exercises. Hydrostatic pressure also supports joints by unweighting joint load by at least 50 % in 3-4 feet depth water. 80% in chest to neck deep water. Water will provide assistance with movement using the current and laminar flow while the buoyancy reduces weight bearing. Pt requires the viscosity of the water for resistance with strengthening exercises.   12/17/2021: See  HEP below     PATIENT EDUCATION:  Education details: Diagnosis, Prognosis, HEP, POC, LEFS Person educated: Patient Education method: Explanation, Demonstration, Tactile cues, Verbal cues, and Handouts Education comprehension: verbalized understanding, returned demonstration, verbal cues required, and tactile cues required     HOME EXERCISE PROGRAM: Access Code: [email or web address] URL: [email or web address] Date: 12/17/2021 Prepared by: Gardiner Rhyme   Exercises -  Supine Quad Set  - 2 x daily - 7 x weekly - 2 sets - 10 reps - 5 seconds hold - Active Straight Leg Raise with Quad Set  - 1-2 x daily - 7 x weekly - 1-2 sets - 10 reps - Supine Bridge  - 1-2 x daily - 7 x weekly - 1-2 sets - 10 reps - 5 seconds hold - Hooklying Isometric Clamshell  - 1-2 x daily - 7 x weekly - 1-2 sets - 10 reps   ASSESSMENT:   CLINICAL IMPRESSION: Patient presents for her first aquatic therapy session stating she has no current pain and hasn't had much pain, only some stiffness in the R knee the past week. She has not reintroduced gym training yet at this time. Session today focused on general LE strengthening in the aquatic environment for use of buoyancy to offload joints and the viscosity of water as resistance during therapeutic exercise. During some exercises with plantarflexion, she had slight pain and tightness along the lateral side of the LE, around peroneus brevis and ATFL ligament in ankle. Patient was able to tolerate all prescribed exercises in the aquatic environment with no adverse effects and reports 1/10 pain at the end of the session. Patient continues to benefit from skilled PT services on land and aquatic based and should be progressed as able to improve functional independence.     OBJECTIVE IMPAIRMENTS decreased strength, impaired perceived functional ability, improper body mechanics, and pain.    ACTIVITY LIMITATIONS squatting and stairs   PARTICIPATION LIMITATIONS: community  activity   PERSONAL FACTORS Fitness and 1-2 comorbidities: HLD, kidney disease  are also affecting patient's functional outcome.    REHAB POTENTIAL: Good   CLINICAL DECISION MAKING: Stable/uncomplicated   EVALUATION COMPLEXITY: Low     GOALS: Goals reviewed with patient? No   SHORT TERM GOALS: Target date: 01/07/2022  Pt will be independent with initial HEP. Baseline: given at eval Goal status: INITIAL 2.  LE MMT will be assessed with LTG created as appropriate. Baseline: not taken Goal status: INITIAL     LONG TERM GOALS: Target date: 02/11/2022    Pt will be independent with advanced HEP to continue with progression of strength. Baseline: not provided yet Goal status: INITIAL   2.  Pt will increase LEFS to 80/80, demonstrating no functional deficits. Baseline: 75/80 Goal status: INITIAL   3.  Pt will return to gym routine with no R knee pain or issues.  Baseline: not going currently. Goal status: INITIAL   4.  Pt will negotiate stairs safely with no pain of feeling of knee instability. Baseline: has felt instability  Goal status: INITIAL       PLAN: PT FREQUENCY: 1-2x/week   PT DURATION: 8 weeks   PLANNED INTERVENTIONS: Therapeutic exercises, Therapeutic activity, Neuromuscular re-education, Balance training, Gait training, Patient/Family education, Joint mobilization, Stair training, Dry Needling, Cryotherapy, Moist heat, Taping, Manual therapy, and Re-evaluation   PLAN FOR NEXT SESSION: LE MMT, assess HEP/update PRN, progress LE/hip strength, balance and further functional testing as deemed appropriate, modalities and manual PRN, calf stretching    Evelene Croon, PTA 12/24/2021, 12:33 PM

## 2021-12-24 ENCOUNTER — Ambulatory Visit: Payer: Medicaid Other | Attending: Family Medicine

## 2021-12-24 DIAGNOSIS — R262 Difficulty in walking, not elsewhere classified: Secondary | ICD-10-CM | POA: Insufficient documentation

## 2021-12-24 DIAGNOSIS — M25561 Pain in right knee: Secondary | ICD-10-CM | POA: Diagnosis not present

## 2021-12-24 DIAGNOSIS — M6281 Muscle weakness (generalized): Secondary | ICD-10-CM | POA: Diagnosis present

## 2021-12-29 NOTE — Therapy (Signed)
OUTPATIENT PHYSICAL THERAPY TREATMENT NOTE   Patient Name: Alexis Howell MRN: [id number] DOB:1981-06-13, 41 y.o., female Today's Date: 12/30/2021  PCP: Nadyne Coombes, MD REFERRING PROVIDER: Nadyne Coombes, MD  END OF SESSION:   PT End of Session - 12/30/21 2043     Visit Number 3    Number of Visits 12    Date for PT Re-Evaluation 02/11/22    Authorization Type [employer]    PT Start Time 0350    PT Stop Time 0435    PT Time Calculation (min) 45 min    Activity Tolerance Patient tolerated treatment well    Behavior During Therapy [facility] for tasks assessed/performed              Past Medical History:  Diagnosis Date   Delivery of pregnancy by cesarean section: Indication: arrest of dilation 11/26/2016   Hyperlipemia    Kidney stones    Obesity    Pancreatitis    PCOS (polycystic ovarian syndrome)    Prolonged first stage (of labor) 11/26/2016   Past Surgical History:  Procedure Laterality Date   BIOPSY  09/09/2021   Procedure: BIOPSY;  Surgeon: Rachael Fee, MD;  Location: [facility];  Service: Endoscopy;;   CESAREAN SECTION N/A 11/26/2016   Procedure: CESAREAN SECTION;  Surgeon: Noland Fordyce, MD;  Location: [facility];  Service: Obstetrics;  Laterality: N/A;   CHOLECYSTECTOMY N/A 08/12/2014   Procedure: LAPAROSCOPIC CHOLECYSTECTOMY WITH INTRAOPERATIVE CHOLANGIOGRAM;  Surgeon: Atilano Ina, MD;  Location: [facility];  Service: General;  Laterality: N/A;   ESOPHAGOGASTRODUODENOSCOPY (EGD) WITH PROPOFOL N/A 09/09/2021   Procedure: ESOPHAGOGASTRODUODENOSCOPY (EGD) WITH PROPOFOL;  Surgeon: Rachael Fee, MD;  Location: [facility];  Service: Endoscopy;  Laterality: N/A;   TONSILLECTOMY AND ADENOIDECTOMY     WISDOM TOOTH EXTRACTION     Patient Active Problem List   Diagnosis Date Noted   Unspecified ovarian cyst, left side 07/07/2021   Polycystic ovaries 07/07/2021   Hx of pancreatitis 07/07/2021   BMI 45.0-49.9, adult (HCC) 10/03/2017     REFERRING DIAG: (R) Knee Sprain  THERAPY DIAG:  Acute pain of right knee  Difficulty in walking, not elsewhere classified  Muscle weakness (generalized)  Rationale for Evaluation and Treatment Rehabilitation  PERTINENT HISTORY: L Knee injury early 2000's - no trouble now  PRECAUTIONS: None  SUBJECTIVE: Pt reports some medial knee soreness the day after aquatic therapy which resolved.  PAIN:  Are you having pain? Yes: NPRS scale: Present: 1/10 (currently) Pain location: R knee Pain description: was white hot Aggravating factors: Stairs, sit <> stand,  Relieving factors: PRICE to some extent, initially Prednisone   OBJECTIVE: (objective measures completed at initial evaluation unless otherwise dated)   DIAGNOSTIC FINDINGS: R Knee XR: no acute osseous abnormality, small joint effusion   PATIENT SURVEYS:  LEFS 75/80   COGNITION:           Overall cognitive status: Within functional limits for tasks assessed                          SENSATION: WFL   EDEMA:  Circumferential: 17.5 in B   MUSCLE LENGTH: Hamstrings: Right NA deg; Left NA deg Thomas test: Right NA deg; Left NA deg   POSTURE: No Significant postural limitations   PALPATION: unremarkable   LOWER EXTREMITY ROM:   Active ROM Right eval Left eval  Hip flexion      Hip extension      Hip  abduction      Hip adduction      Hip internal rotation      Hip external rotation      Knee flexion 120 121  Knee extension 3 3  Ankle dorsiflexion      Ankle plantarflexion      Ankle inversion      Ankle eversion       (Blank rows = not tested)   LOWER EXTREMITY MMT:   MMT Right eval Left eval  Hip flexion      Hip extension      Hip abduction      Hip adduction      Hip internal rotation      Hip external rotation      Knee flexion      Knee extension      Ankle dorsiflexion      Ankle plantarflexion      Ankle inversion      Ankle eversion       (Blank rows = not tested)   LOWER  EXTREMITY SPECIAL TESTS:  Knee special tests: Valgus/varus stress (-), flexion and extension with OP (-)   FUNCTIONAL TESTS:  Squatting: B hip ER with R knee audibly cracking during concentric phase   GAIT: Distance walked: 50 ft Assistive device utilized: None Level of assistance: Complete Independence Comments: WNL       TODAY'S TREATMENT: [facility] Adult PT Treatment:                                                DATE: 12/30/21 Therapeutic Exercise: Nustep 5 mins L5 UE/LE Straight Leg Raise with Quad Set 10 reps 3 sec hold Supine Bridge 10 reps - 5 seconds hold SL Clamshell  2x10 reps L and R BluTB, medial R knee pain  c R leg ER Supine clams 2x10 bilat BluTB, no R knee pain Seated hamstring 1x 30 sec Supine hamstring stretch 2x 30" c strap Prone R quad stretch 2x30" c strap Self Care: Instruction and application of CFM to the medial patella border and joint line with pt returning demonstration  [facility] Adult PT Treatment:                                                DATE: 12/23/2021 Aquatic therapy at [address] - therapeutic pool temp 91 degrees Pt enters building ambulating independently.  Treatment took place in water 3.8 to  4 ft 8 in.feet deep depending upon activity.  Pt entered and exited the pool via stair and handrails independently.   Pt pain level 0/10 at initiation of water walking.  Therapeutic Exercise: Walking forward/backwards/side stepping Lunge stepping forwards x2 laps Side stepping lunge x2 laps Runners stretch on bottom step x30" BIL Hamstring stretch on bottom step x30" BIL Figure 4 squat stretch, BIL UE support x30" BIL At edge of pool, pt performed LE exercise: Hip abd/add x20 BIL Hip ext/flex with knee straight x 20 BIL Hip Circles CC/CCW 2x10 each BIL Marching hip flexion to knee extension 2x10 BIL Hamstring curl x20 BIL Squats 2x20 Heel/toe raises x20 Sitting on yellow noodle with 2 yellow dumbbells at sides: Bicycle kicks  x1' Reverse bicycle kicks x1' Flutter kicks x1'  Scissor kicks x1' Neuromuscular re-ed: Tandem stance x30" BIL Pt requires the buoyancy of water for active assisted exercises with buoyancy supported for strengthening and AROM exercises. Hydrostatic pressure also supports joints by unweighting joint load by at least 50 % in 3-4 feet depth water. 80% in chest to neck deep water. Water will provide assistance with movement using the current and laminar flow while the buoyancy reduces weight bearing. Pt requires the viscosity of the water for resistance with strengthening exercises.   12/17/2021: See HEP below     PATIENT EDUCATION:  Education details: Diagnosis, Prognosis, HEP, POC, LEFS Person educated: Patient Education method: Explanation, Demonstration, Tactile cues, Verbal cues, and Handouts Education comprehension: verbalized understanding, returned demonstration, verbal cues required, and tactile cues required     HOME EXERCISE PROGRAM: Access Code: [email or web address] URL: [email or web address] Date: 12/17/2021 Prepared by: Gardiner Rhyme   Exercises - Supine Quad Set  - 2 x daily - 7 x weekly - 2 sets - 10 reps - 5 seconds hold - Active Straight Leg Raise with Quad Set  - 1-2 x daily - 7 x weekly - 1-2 sets - 10 reps - Supine Bridge  - 1-2 x daily - 7 x weekly - 1-2 sets - 10 reps - 5 seconds hold - Hooklying Isometric Clamshell  - 1-2 x daily - 7 x weekly - 1-2 sets - 10 reps   ASSESSMENT:   CLINICAL IMPRESSION: Pt participated in PT for R knee/LE strengthening and flexibility. Additionally, CFM was provided to the R medial patella border and medial joint line. Pt was instructed in the technique and returned demonstration. Pt's R knee pain continues to be improved. Will gradually progress the physical demand as tolerated. Pt tolerated today's session without adverse effects.    OBJECTIVE IMPAIRMENTS decreased strength, impaired perceived functional ability, improper body  mechanics, and pain.    ACTIVITY LIMITATIONS squatting and stairs   PARTICIPATION LIMITATIONS: community activity   PERSONAL FACTORS Fitness and 1-2 comorbidities: HLD, kidney disease  are also affecting patient's functional outcome.    REHAB POTENTIAL: Good   CLINICAL DECISION MAKING: Stable/uncomplicated   EVALUATION COMPLEXITY: Low     GOALS: Goals reviewed with patient? No   SHORT TERM GOALS: Target date: 01/07/2022  Pt will be independent with initial HEP. Baseline: given at eval Goal status: INITIAL 2.  LE MMT will be assessed with LTG created as appropriate. Baseline: not taken Goal status: INITIAL     LONG TERM GOALS: Target date: 02/11/2022    Pt will be independent with advanced HEP to continue with progression of strength. Baseline: not provided yet Goal status: INITIAL   2.  Pt will increase LEFS to 80/80, demonstrating no functional deficits. Baseline: 75/80 Goal status: INITIAL   3.  Pt will return to gym routine with no R knee pain or issues.  Baseline: not going currently. Goal status: INITIAL   4.  Pt will negotiate stairs safely with no pain of feeling of knee instability. Baseline: has felt instability  Goal status: INITIAL       PLAN: PT FREQUENCY: 1-2x/week   PT DURATION: 8 weeks   PLANNED INTERVENTIONS: Therapeutic exercises, Therapeutic activity, Neuromuscular re-education, Balance training, Gait training, Patient/Family education, Joint mobilization, Stair training, Dry Needling, Cryotherapy, Moist heat, Taping, Manual therapy, and Re-evaluation   PLAN FOR NEXT SESSION: LE MMT, assess HEP/update PRN, progress LE/hip strength, balance and further functional testing as deemed appropriate, modalities and manual PRN, calf stretching   Nanda Bittick MS, PT 12/30/21  8:43 PM

## 2021-12-30 ENCOUNTER — Ambulatory Visit: Payer: Medicaid Other

## 2021-12-30 DIAGNOSIS — M25561 Pain in right knee: Secondary | ICD-10-CM | POA: Diagnosis not present

## 2021-12-30 DIAGNOSIS — M6281 Muscle weakness (generalized): Secondary | ICD-10-CM

## 2021-12-30 DIAGNOSIS — R262 Difficulty in walking, not elsewhere classified: Secondary | ICD-10-CM

## 2022-01-03 ENCOUNTER — Ambulatory Visit: Payer: Medicaid Other | Admitting: Physical Therapy

## 2022-01-03 ENCOUNTER — Encounter: Payer: Self-pay | Admitting: Physical Therapy

## 2022-01-03 DIAGNOSIS — R262 Difficulty in walking, not elsewhere classified: Secondary | ICD-10-CM

## 2022-01-03 DIAGNOSIS — M6281 Muscle weakness (generalized): Secondary | ICD-10-CM

## 2022-01-03 DIAGNOSIS — M25561 Pain in right knee: Secondary | ICD-10-CM | POA: Diagnosis not present

## 2022-01-03 NOTE — Therapy (Signed)
OUTPATIENT PHYSICAL THERAPY TREATMENT NOTE   Patient Name: Alexis Howell MRN: [id number] DOB:08/18/1980, 41 y.o., female Today's Date: 01/03/2022  PCP: Horald Pollen, MD REFERRING PROVIDER: Horald Pollen, MD  END OF SESSION:   PT End of Session - 01/03/22 0805     Visit Number 4    Number of Visits 12    Date for PT Re-Evaluation 02/11/22    Authorization Type [employer]    PT Start Time 0800    PT Stop Time 0845    PT Time Calculation (min) 45 min    Activity Tolerance Patient tolerated treatment well    Behavior During Therapy [facility] for tasks assessed/performed               Past Medical History:  Diagnosis Date   Delivery of pregnancy by cesarean section: Indication: arrest of dilation 11/26/2016   Hyperlipemia    Kidney stones    Obesity    Pancreatitis    PCOS (polycystic ovarian syndrome)    Prolonged first stage (of labor) 11/26/2016   Past Surgical History:  Procedure Laterality Date   BIOPSY  09/09/2021   Procedure: BIOPSY;  Surgeon: Milus Banister, MD;  Location: [facility];  Service: Endoscopy;;   CESAREAN SECTION N/A 11/26/2016   Procedure: CESAREAN SECTION;  Surgeon: Aloha Gell, MD;  Location: [place];  Service: Obstetrics;  Laterality: N/A;   CHOLECYSTECTOMY N/A 08/12/2014   Procedure: LAPAROSCOPIC CHOLECYSTECTOMY WITH INTRAOPERATIVE CHOLANGIOGRAM;  Surgeon: Gayland Curry, MD;  Location: [facility];  Service: General;  Laterality: N/A;   ESOPHAGOGASTRODUODENOSCOPY (EGD) WITH PROPOFOL N/A 09/09/2021   Procedure: ESOPHAGOGASTRODUODENOSCOPY (EGD) WITH PROPOFOL;  Surgeon: Milus Banister, MD;  Location: [facility];  Service: Endoscopy;  Laterality: N/A;   TONSILLECTOMY AND ADENOIDECTOMY     WISDOM TOOTH EXTRACTION     Patient Active Problem List   Diagnosis Date Noted   Unspecified ovarian cyst, left side 07/07/2021   Polycystic ovaries 07/07/2021   Hx of pancreatitis 07/07/2021   BMI 45.0-49.9, adult ([place]) 10/03/2017     REFERRING DIAG: (R) Knee Sprain  THERAPY DIAG:  Acute pain of right knee  Difficulty in walking, not elsewhere classified  Muscle weakness (generalized)  Rationale for Evaluation and Treatment Rehabilitation  PERTINENT HISTORY: L Knee injury early 2000's - no trouble now  PRECAUTIONS: None  SUBJECTIVE: Its getting better.  I still get some "hot spots" , 2 areas that get warm to the touch.  The massages do help the areas, doing it about 1 x per day. Has not been back to the gym. She is fearful of reinjury.    PAIN:  Are you having pain? Yes: NPRS scale: Present: 0/10 (currently) Pain location: R knee Pain description: was white hot Aggravating factors: Stairs, sit <> stand,  Relieving factors: PRICE to some extent, initially Prednisone   OBJECTIVE: (objective measures completed at initial evaluation unless otherwise dated)   DIAGNOSTIC FINDINGS: R Knee XR: no acute osseous abnormality, small joint effusion   PATIENT SURVEYS:  LEFS 75/80   COGNITION:           Overall cognitive status: Within functional limits for tasks assessed                          SENSATION: WFL   EDEMA:  Circumferential: 17.5 in B   MUSCLE LENGTH: Hamstrings: Right NA deg; Left NA deg Thomas test: Right NA deg; Left NA deg   POSTURE: No Significant  postural limitations   PALPATION: unremarkable   LOWER EXTREMITY ROM:   Active ROM Right eval Left eval  Hip flexion      Hip extension      Hip abduction      Hip adduction      Hip internal rotation      Hip external rotation      Knee flexion 120 121  Knee extension 3 3  Ankle dorsiflexion      Ankle plantarflexion      Ankle inversion      Ankle eversion       (Blank rows = not tested)   LOWER EXTREMITY MMT:   MMT Right 01/03/22 Left 01/03/22  Hip flexion 5   5  Hip extension      Hip abduction 4+   4+  Hip adduction      Hip internal rotation      Hip external rotation      Knee flexion  5  5  Knee extension   5  5  Ankle dorsiflexion      Ankle plantarflexion      Ankle inversion      Ankle eversion       (Blank rows = not tested)   LOWER EXTREMITY SPECIAL TESTS:  Knee special tests: Valgus/varus stress (-), flexion and extension with OP (-)   FUNCTIONAL TESTS:  Squatting: B hip ER with R knee audibly cracking during concentric phase   GAIT: Distance walked: 50 ft Assistive device utilized: None Level of assistance: Complete Independence Comments: WNL       TODAY'S TREATMENT:   [facility] Adult PT Treatment:                                                DATE: 01/03/22 Therapeutic Exercise: Nustep LE only L6, 6 min  S/L Hip abd green band x 20, clam x 20  Bridging with band x 15  Sit to stand with GTB and 10 lbs x 10 slow, min clicking and soreness Rt knee  Wall sit 15 sec x 4 , partial Leg extension 15 lbs x 15, 10 lbs , x 10  Hamstring curl 35 lbs x 15  Stretching to hamstring, adductor and ITB with strap, bilateral. 30 sec x 1 each  Self Care: Advised to try a modified routine at the gym to see if she can reintroduce it.  Slow down and focus.    [facility] Adult PT Treatment:                                                DATE: 12/30/21 Therapeutic Exercise: Nustep 5 mins L5 UE/LE Straight Leg Raise with Quad Set 10 reps 3 sec hold Supine Bridge 10 reps - 5 seconds hold SL Clamshell  2x10 reps L and R BluTB, medial R knee pain  c R leg ER Supine clams 2x10 bilat BluTB, no R knee pain Seated hamstring 1x 30 sec Supine hamstring stretch 2x 30" c strap Prone R quad stretch 2x30" c strap Self Care: Instruction and application of CFM to the medial patella border and joint line with pt returning demonstration  [facility] Adult PT Treatment:  DATE: 12/23/2021 Aquatic therapy at [facility] [address] - therapeutic pool temp 91 degrees Pt enters building ambulating independently.  Treatment took place in water 3.8 to  4 ft 8 in.feet deep  depending upon activity.  Pt entered and exited the pool via stair and handrails independently.   Pt pain level 0/10 at initiation of water walking.  Therapeutic Exercise: Walking forward/backwards/side stepping Lunge stepping forwards x2 laps Side stepping lunge x2 laps Runners stretch on bottom step x30" BIL Hamstring stretch on bottom step x30" BIL Figure 4 squat stretch, BIL UE support x30" BIL At edge of pool, pt performed LE exercise: Hip abd/add x20 BIL Hip ext/flex with knee straight x 20 BIL Hip Circles CC/CCW 2x10 each BIL Marching hip flexion to knee extension 2x10 BIL Hamstring curl x20 BIL Squats 2x20 Heel/toe raises x20 Sitting on yellow noodle with 2 yellow dumbbells at sides: Bicycle kicks x1' Reverse bicycle kicks x1' Flutter kicks x1' Scissor kicks x1' Neuromuscular re-ed: Tandem stance x30" BIL Pt requires the buoyancy of water for active assisted exercises with buoyancy supported for strengthening and AROM exercises. Hydrostatic pressure also supports joints by unweighting joint load by at least 50 % in 3-4 feet depth water. 80% in chest to neck deep water. Water will provide assistance with movement using the current and laminar flow while the buoyancy reduces weight bearing. Pt requires the viscosity of the water for resistance with strengthening exercises.   12/17/2021: See HEP below     PATIENT EDUCATION:  Education details: Diagnosis, Prognosis, HEP, POC, LEFS Person educated: Patient Education method: Explanation, Demonstration, Tactile cues, Verbal cues, and Handouts Education comprehension: verbalized understanding, returned demonstration, verbal cues required, and tactile cues required     HOME EXERCISE PROGRAM:  Access Code: [email or web address] URL: [email or web address] Date: 01/03/2022 Prepared by: Karie Mainland  Exercises - Supine Quad Set  - 2 x daily - 7 x weekly - 2 sets - 10 reps - 5 seconds hold - Active Straight Leg Raise with Quad  Set  - 1-2 x daily - 7 x weekly - 1-2 sets - 10 reps - Supine Bridge  - 1-2 x daily - 7 x weekly - 1-2 sets - 10 reps - 5 seconds hold - Hooklying Clamshell with Resistance  - 1-2 x daily - 7 x weekly - 1-2 sets - 10 reps - 3 hold - Hooklying Hamstring Stretch with Strap  - 1-2 x daily - 7 x weekly - 1 sets - 3 reps - 30 hold - Prone Quadriceps Stretch with Strap  - 1-2 x daily - 7 x weekly - 1 sets - 3 reps - 30 hold - Supine ITB Stretch with Strap  - 1-2 x daily - 7 x weekly - 1 sets - 3 reps - 30 hold  ASSESSMENT:   CLINICAL IMPRESSION: Patient able to perform closed chain exercises.     OBJECTIVE IMPAIRMENTS decreased strength, impaired perceived functional ability, improper body mechanics, and pain.    ACTIVITY LIMITATIONS squatting and stairs   PARTICIPATION LIMITATIONS: community activity   PERSONAL FACTORS Fitness and 1-2 comorbidities: HLD, kidney disease  are also affecting patient's functional outcome.    REHAB POTENTIAL: Good   CLINICAL DECISION MAKING: Stable/uncomplicated   EVALUATION COMPLEXITY: Low     GOALS: Goals reviewed with patient? No   SHORT TERM GOALS: Target date: 01/07/2022  Pt will be independent with initial HEP. Baseline: given at eval Goal status:  2.  LE MMT will be assessed with LTG created as  appropriate. Baseline: not taken Goal status: INITIAL     LONG TERM GOALS: Target date: 02/11/2022    Pt will be independent with advanced HEP to continue with progression of strength. Baseline: not provided yet Goal status: ongoing    2.  Pt will increase LEFS to 80/80, demonstrating no functional deficits. Baseline: 75/80 Goal status: ongoing    3.  Pt will return to gym routine with no R knee pain or issues.  Baseline: has not returned Goal status: ongoing   4.  Pt will negotiate stairs safely with no pain of feeling of knee instability. Baseline: has not felt instability , gets tired easier Goal status: ongoing        PLAN: PT  FREQUENCY: 1-2x/week   PT DURATION: 8 weeks   PLANNED INTERVENTIONS: Therapeutic exercises, Therapeutic activity, Neuromuscular re-education, Balance training, Gait training, Patient/Family education, Joint mobilization, Stair training, Dry Needling, Cryotherapy, Moist heat, Taping, Manual therapy, and Re-evaluation   PLAN FOR NEXT SESSION: assess HEP/update PRN, progress LE/hip strength, balance and further functional testing as deemed appropriate, modalities and manual PRN, calf stretching. Have you been back to the gym     Raeford Razor, PT 01/03/22 12:52 PM Phone: [phone] Fax: [phone]

## 2022-01-10 ENCOUNTER — Ambulatory Visit: Payer: Medicaid Other | Admitting: Physical Therapy

## 2022-01-10 ENCOUNTER — Encounter: Payer: Self-pay | Admitting: Physical Therapy

## 2022-01-10 DIAGNOSIS — M6281 Muscle weakness (generalized): Secondary | ICD-10-CM

## 2022-01-10 DIAGNOSIS — M25561 Pain in right knee: Secondary | ICD-10-CM | POA: Diagnosis not present

## 2022-01-10 DIAGNOSIS — R262 Difficulty in walking, not elsewhere classified: Secondary | ICD-10-CM

## 2022-01-10 NOTE — Therapy (Signed)
OUTPATIENT PHYSICAL THERAPY TREATMENT NOTE   Patient Name: Alexis Howell MRN: [id number] DOB:1980/10/09, 41 y.o., female Today's Date: 01/10/2022  PCP: Horald Pollen, MD REFERRING PROVIDER: Horald Pollen, MD  END OF SESSION:   PT End of Session - 01/10/22 0849     Visit Number 5    Number of Visits 12    Date for PT Re-Evaluation 02/11/22    Authorization Type [employer]    Authorization Time Period 12/18/21-01/16/22    Authorization - Visit Number 4    Authorization - Number of Visits 4    PT Start Time 0848    PT Stop Time 0929    PT Time Calculation (min) 41 min               Past Medical History:  Diagnosis Date   Delivery of pregnancy by cesarean section: Indication: arrest of dilation 11/26/2016   Hyperlipemia    Kidney stones    Obesity    Pancreatitis    PCOS (polycystic ovarian syndrome)    Prolonged first stage (of labor) 11/26/2016   Past Surgical History:  Procedure Laterality Date   BIOPSY  09/09/2021   Procedure: BIOPSY;  Surgeon: Milus Banister, MD;  Location: [facility];  Service: Endoscopy;;   CESAREAN SECTION N/A 11/26/2016   Procedure: CESAREAN SECTION;  Surgeon: Aloha Gell, MD;  Location: [facility];  Service: Obstetrics;  Laterality: N/A;   CHOLECYSTECTOMY N/A 08/12/2014   Procedure: LAPAROSCOPIC CHOLECYSTECTOMY WITH INTRAOPERATIVE CHOLANGIOGRAM;  Surgeon: Gayland Curry, MD;  Location: [facility];  Service: General;  Laterality: N/A;   ESOPHAGOGASTRODUODENOSCOPY (EGD) WITH PROPOFOL N/A 09/09/2021   Procedure: ESOPHAGOGASTRODUODENOSCOPY (EGD) WITH PROPOFOL;  Surgeon: Milus Banister, MD;  Location: [facility];  Service: Endoscopy;  Laterality: N/A;   TONSILLECTOMY AND ADENOIDECTOMY     WISDOM TOOTH EXTRACTION     Patient Active Problem List   Diagnosis Date Noted   Unspecified ovarian cyst, left side 07/07/2021   Polycystic ovaries 07/07/2021   Hx of pancreatitis 07/07/2021   BMI 45.0-49.9, adult ([place]) 10/03/2017     REFERRING DIAG: (R) Knee Sprain  THERAPY DIAG:  Acute pain of right knee  Difficulty in walking, not elsewhere classified  Muscle weakness (generalized)  Rationale for Evaluation and Treatment Rehabilitation  PERTINENT HISTORY: L Knee injury early 2000's - no trouble now  PRECAUTIONS: None  SUBJECTIVE: "I went back to the gym. Did the elliptical the first day and the knee was tweaked. I rested it and then tried again. I am able to do 8 minutes. I have not started back the leg machines yet. I still can't sit with my knee bent and out to the side. "   PAIN:  Are you having pain? No: NPRS scale: 0/10 Pain location: R knee Pain description: tweak Aggravating factors: too long on the elliptical Relieving factors: Rest, ice   OBJECTIVE: (objective measures completed at initial evaluation unless otherwise dated)   DIAGNOSTIC FINDINGS: R Knee XR: no acute osseous abnormality, small joint effusion   PATIENT SURVEYS:  LEFS 75/80   COGNITION:           Overall cognitive status: Within functional limits for tasks assessed                          SENSATION: WFL   EDEMA:  Circumferential: 17.5 in B   MUSCLE LENGTH: Hamstrings: Right NA deg; Left NA deg Thomas test: Right NA deg; Left NA deg  POSTURE: No Significant postural limitations   PALPATION: unremarkable   LOWER EXTREMITY ROM:   Active ROM Right 12/17/21 Left 12/17/21  Hip flexion      Hip extension      Hip abduction      Hip adduction      Hip internal rotation      Hip external rotation      Knee flexion 120 121  Knee extension 3 3  Ankle dorsiflexion      Ankle plantarflexion      Ankle inversion      Ankle eversion       (Blank rows = not tested)   LOWER EXTREMITY MMT:   MMT 12/17/21 Not tested Right 01/03/22 Left 01/03/22  Hip flexion  5   5  Hip extension       Hip abduction  4+   4+  Hip adduction       Hip internal rotation       Hip external rotation       Knee flexion   5  5   Knee extension   5  5  Ankle dorsiflexion       Ankle plantarflexion       Ankle inversion       Ankle eversion        (Blank rows = not tested)   LOWER EXTREMITY SPECIAL TESTS:  Knee special tests: Valgus/varus stress (-), flexion and extension with OP (-)   FUNCTIONAL TESTS:  Squatting: B hip ER with R knee audibly cracking during concentric phase   GAIT: Distance walked: 50 ft Assistive device utilized: None Level of assistance: Complete Independence Comments: WNL       TODAY'S TREATMENT: [facility] Adult PT Treatment:                                                DATE: 01/10/22 Therapeutic Exercise: Nustep LE only L5, 6 min  Leg extension 15 lbs x 20 , 5lbs right x 10 Hamstring curl 20 lbs x  20 right Leg Press 80# 10 x 2  Step up 6 inch 2 x 10 right  4inch retro step up x 10 4inch heel strike x 10 SLS  SLS on airex  Stretching to R hamstring, prone quad,  ITB with strap, bilateral. 30 sec x 2 each  Figure 4 stretch right - pain lateral knee  and lateral ankle  S/L Hip abd  x 20 each   [facility] Adult PT Treatment:                                                DATE: 01/03/22 Therapeutic Exercise: Nustep LE only L6, 6 min  S/L Hip abd green band x 20, clam x 20  Bridging with band x 15  Sit to stand with GTB and 10 lbs x 10 slow, min clicking and soreness Rt knee  Wall sit 15 sec x 4 , partial Leg extension 15 lbs x 15, 10 lbs , x 10  Hamstring curl 35 lbs x 15  Stretching to hamstring, adductor and ITB with strap, bilateral. 30 sec x 1 each  Self Care: Advised to try a modified routine at the gym to see if  she can reintroduce it.  Slow down and focus.    [facility] Adult PT Treatment:                                                DATE: 12/30/21 Therapeutic Exercise: Nustep 5 mins L5 UE/LE Straight Leg Raise with Quad Set 10 reps 3 sec hold Supine Bridge 10 reps - 5 seconds hold SL Clamshell  2x10 reps L and R BluTB, medial R knee pain  c R leg ER Supine clams 2x10 bilat  BluTB, no R knee pain Seated hamstring 1x 30 sec Supine hamstring stretch 2x 30" c strap Prone R quad stretch 2x30" c strap Self Care: Instruction and application of CFM to the medial patella border and joint line with pt returning demonstration  [facility] Adult PT Treatment:                                                DATE: 12/23/2021 Aquatic therapy at [address] - therapeutic pool temp 91 degrees Pt enters building ambulating independently.  Treatment took place in water 3.8 to  4 ft 8 in.feet deep depending upon activity.  Pt entered and exited the pool via stair and handrails independently.   Pt pain level 0/10 at initiation of water walking.  Therapeutic Exercise: Walking forward/backwards/side stepping Lunge stepping forwards x2 laps Side stepping lunge x2 laps Runners stretch on bottom step x30" BIL Hamstring stretch on bottom step x30" BIL Figure 4 squat stretch, BIL UE support x30" BIL At edge of pool, pt performed LE exercise: Hip abd/add x20 BIL Hip ext/flex with knee straight x 20 BIL Hip Circles CC/CCW 2x10 each BIL Marching hip flexion to knee extension 2x10 BIL Hamstring curl x20 BIL Squats 2x20 Heel/toe raises x20 Sitting on yellow noodle with 2 yellow dumbbells at sides: Bicycle kicks x1' Reverse bicycle kicks x1' Flutter kicks x1' Scissor kicks x1' Neuromuscular re-ed: Tandem stance x30" BIL Pt requires the buoyancy of water for active assisted exercises with buoyancy supported for strengthening and AROM exercises. Hydrostatic pressure also supports joints by unweighting joint load by at least 50 % in 3-4 feet depth water. 80% in chest to neck deep water. Water will provide assistance with movement using the current and laminar flow while the buoyancy reduces weight bearing. Pt requires the viscosity of the water for resistance with strengthening exercises.   12/17/2021: See HEP below     PATIENT EDUCATION:  Education details: Diagnosis,  Prognosis, HEP, POC, LEFS Person educated: Patient Education method: Explanation, Demonstration, Tactile cues, Verbal cues, and Handouts Education comprehension: verbalized understanding, returned demonstration, verbal cues required, and tactile cues required     HOME EXERCISE PROGRAM:  Access Code: [email or web address] URL: [email or web address] Date: 01/03/2022 Prepared by: Raeford Razor  Exercises - Supine Quad Set  - 2 x daily - 7 x weekly - 2 sets - 10 reps - 5 seconds hold - Active Straight Leg Raise with Quad Set  - 1-2 x daily - 7 x weekly - 1-2 sets - 10 reps - Supine Bridge  - 1-2 x daily - 7 x weekly - 1-2 sets - 10 reps - 5 seconds hold - Hooklying Clamshell with Resistance  - 1-2 x daily -  7 x weekly - 1-2 sets - 10 reps - 3 hold - Hooklying Hamstring Stretch with Strap  - 1-2 x daily - 7 x weekly - 1 sets - 3 reps - 30 hold - Prone Quadriceps Stretch with Strap  - 1-2 x daily - 7 x weekly - 1 sets - 3 reps - 30 hold - Supine ITB Stretch with Strap  - 1-2 x daily - 7 x weekly - 1 sets - 3 reps - 30 hold  ASSESSMENT:   CLINICAL IMPRESSION: Patient reports she has returned to the gym and is able to complete 8 minutes on the elliptical. Worked with LE gym machines today and she did well without pain. Encouraged return to gym machines independently this week.  Added unstable surface to SLS and she was able to maintain for 30 sec. Step ups and eccentric step downs tolerated well without pain. She reports intermittent feeling of instability with ADLS and inability to sit comfortably with her right leg in hip ER/knee flexion. Figure 4 stretch added today to address ROM. She has one more aquatic visit and then she will be in [place] until August 14th. Will recheck goals at that appointment with potential for discharge if going well with gym/ HEP. Pt will benefit from 1 x per week for 4 more weeks to progress functional strengthening and monitor return to indpendent gym program.     OBJECTIVE IMPAIRMENTS decreased strength, impaired perceived functional ability, improper body mechanics, and pain.    ACTIVITY LIMITATIONS squatting and stairs   PARTICIPATION LIMITATIONS: community activity   PERSONAL FACTORS Fitness and 1-2 comorbidities: HLD, kidney disease  are also affecting patient's functional outcome.    REHAB POTENTIAL: Good   CLINICAL DECISION MAKING: Stable/uncomplicated   EVALUATION COMPLEXITY: Low     GOALS: Goals reviewed with patient? No   SHORT TERM GOALS: Target date: 01/07/2022  Pt will be independent with initial HEP. Baseline: given at eval Goal status: MET 01/10/22 2.  LE MMT will be assessed with LTG created as appropriate. Baseline: not taken Goal status: PARTIALLY MET 01/03/22     LONG TERM GOALS: Target date: 02/11/2022    Pt will be independent with advanced HEP to continue with progression of strength. Baseline: not provided yet Status 01/10/22: independent Goal status: MET   2.  Pt will increase LEFS to 80/80, demonstrating no functional deficits. Baseline: 75/80 Goal status: ongoing    3.  Pt will return to gym routine with no R knee pain or issues.  Baseline: has not returned Status 01/10/22: can complete 8 minutes on elliptical without pain Goal status: ongoing   4.  Pt will negotiate stairs safely with no pain of feeling of knee instability. Baseline: has not felt instability , gets tired easier Status: 01/10/22: bouts of instability intermittently Goal status: ongoing        PLAN: PT FREQUENCY: 1-2x/week   PT DURATION: 8 weeks   PLANNED INTERVENTIONS: Therapeutic exercises, Therapeutic activity, Neuromuscular re-education, Balance training, Gait training, Patient/Family education, Joint mobilization, Stair training, Dry Needling, Cryotherapy, Moist heat, Taping, Manual therapy, and Re-evaluation   PLAN FOR NEXT SESSION: assess return to gym machines on knee,  balance , eccentric quad strength, Figure 4  stretch   Check all possible CPT codes: [phone] - PT Re-evaluation, 97110- Therapeutic Exercise, [phone]- Neuro Re-education, [phone] - Gait Training, [phone] - Manual Therapy, [phone] - Therapeutic Activities, and 97535 - [place], PTA 01/10/22 10:39 AM Phone:  [phone] Fax: [phone]

## 2022-01-13 NOTE — Therapy (Signed)
OUTPATIENT PHYSICAL THERAPY TREATMENT NOTE   Patient Name: Alexis Howell MRN: [id number] DOB:[birth year], 41 y.o., female Today's Date: 01/14/2022  PCP: Horald Pollen, MD REFERRING PROVIDER: Horald Pollen, MD  END OF SESSION:   PT End of Session - 01/14/22 1516     Visit Number 6    Number of Visits 12    Date for PT Re-Evaluation 02/11/22    Authorization Type [employer]    Authorization Time Period 12/18/21-01/16/22    Authorization - Visit Number 5    Authorization - Number of Visits 9    PT Start Time 2094    PT Stop Time 1600    PT Time Calculation (min) 45 min    Activity Tolerance Patient tolerated treatment well    Behavior During Therapy [facility] for tasks assessed/performed                Past Medical History:  Diagnosis Date   Delivery of pregnancy by cesarean section: Indication: arrest of dilation 11/26/2016   Hyperlipemia    Kidney stones    Obesity    Pancreatitis    PCOS (polycystic ovarian syndrome)    Prolonged first stage (of labor) 11/26/2016   Past Surgical History:  Procedure Laterality Date   BIOPSY  09/09/2021   Procedure: BIOPSY;  Surgeon: Milus Banister, MD;  Location: [facility];  Service: Endoscopy;;   CESAREAN SECTION N/A 11/26/2016   Procedure: CESAREAN SECTION;  Surgeon: Aloha Gell, MD;  Location: [facility];  Service: Obstetrics;  Laterality: N/A;   CHOLECYSTECTOMY N/A 08/12/2014   Procedure: LAPAROSCOPIC CHOLECYSTECTOMY WITH INTRAOPERATIVE CHOLANGIOGRAM;  Surgeon: Gayland Curry, MD;  Location: [facility];  Service: General;  Laterality: N/A;   ESOPHAGOGASTRODUODENOSCOPY (EGD) WITH PROPOFOL N/A 09/09/2021   Procedure: ESOPHAGOGASTRODUODENOSCOPY (EGD) WITH PROPOFOL;  Surgeon: Milus Banister, MD;  Location: [facility];  Service: Endoscopy;  Laterality: N/A;   TONSILLECTOMY AND ADENOIDECTOMY     WISDOM TOOTH EXTRACTION     Patient Active Problem List   Diagnosis Date Noted   Unspecified ovarian cyst, left side  07/07/2021   Polycystic ovaries 07/07/2021   Hx of pancreatitis 07/07/2021   BMI 45.0-49.9, adult ([place]) 10/03/2017    REFERRING DIAG: (R) Knee Sprain  THERAPY DIAG:  Acute pain of right knee  Difficulty in walking, not elsewhere classified  Muscle weakness (generalized)  Rationale for Evaluation and Treatment Rehabilitation  PERTINENT HISTORY: L Knee injury early 2000's - no trouble now  PRECAUTIONS: None  SUBJECTIVE: I've been going to the gym and it's been going well.    PAIN:  Are you having pain? No: NPRS scale: 0/10 Pain location: R knee Pain description: tweak Aggravating factors: too long on the elliptical Relieving factors: Rest, ice   OBJECTIVE: (objective measures completed at initial evaluation unless otherwise dated)   DIAGNOSTIC FINDINGS: R Knee XR: no acute osseous abnormality, small joint effusion   PATIENT SURVEYS:  LEFS 75/80   COGNITION:           Overall cognitive status: Within functional limits for tasks assessed                          SENSATION: WFL   EDEMA:  Circumferential: 17.5 in B   MUSCLE LENGTH: Hamstrings: Right NA deg; Left NA deg Thomas test: Right NA deg; Left NA deg   POSTURE: No Significant postural limitations   PALPATION: unremarkable   LOWER EXTREMITY ROM:   Active ROM Right 12/17/21  Left 12/17/21  Hip flexion      Hip extension      Hip abduction      Hip adduction      Hip internal rotation      Hip external rotation      Knee flexion 120 121  Knee extension 3 3  Ankle dorsiflexion      Ankle plantarflexion      Ankle inversion      Ankle eversion       (Blank rows = not tested)   LOWER EXTREMITY MMT:   MMT 12/17/21 Not tested Right 01/03/22 Left 01/03/22  Hip flexion  5   5  Hip extension       Hip abduction  4+   4+  Hip adduction       Hip internal rotation       Hip external rotation       Knee flexion   5  5  Knee extension   5  5  Ankle dorsiflexion       Ankle plantarflexion        Ankle inversion       Ankle eversion        (Blank rows = not tested)   LOWER EXTREMITY SPECIAL TESTS:  Knee special tests: Valgus/varus stress (-), flexion and extension with OP (-)   FUNCTIONAL TESTS:  Squatting: B hip ER with R knee audibly cracking during concentric phase   GAIT: Distance walked: 50 ft Assistive device utilized: None Level of assistance: Complete Independence Comments: WNL       TODAY'S TREATMENT: [facility] Adult PT Treatment:                                                DATE: 01/14/2022 Aquatic therapy at [address] - therapeutic pool temp 92 degrees Pt enters building ambulating independently.  Treatment took place in water 3.8 to 4 ft 8 in.feet deep depending upon activity.  Pt entered and exited the pool via stair and handrails independently.   Pt pain level 0/10 at initiation of water walking. Therapeutic Exercise: Walking forward/backwards/side stepping Lunge stepping forwards x2 laps w/yellow DB at sides Side stepping lunge x2 laps w/ yellow DB shoulder abd/add Forward march walk x2 laps Side lunge hip adductor stretch x30" BIL Step ups on bottom step forward/lateral x10 BIL Runners stretch on bottom step x30" BIL Hamstring stretch on bottom step x30" BIL Figure 4 squat stretch, BIL UE support 2x30" BIL At edge of pool, pt performed LE exercise: Hip abd/add x20 BIL Hip ext/flex with knee straight x 20 BIL Hip Circles CC/CCW x10 each BIL Marching hip flexion to knee extension 2x10 BIL Hamstring curl x20 BIL Squats 2x20 Heel/toe raises x20 Sitting on yellow noodle with 2 yellow dumbbells at sides: Bicycle kicks x1' Reverse bicycle kicks x1' Flutter kicks x1' Scissor kicks x1' Neuromuscular re-ed: Tandem stance x30" BIL on yellow noodle Romberg stance x60" on yellow noodle  Pt requires the buoyancy of water for active assisted exercises with buoyancy supported for strengthening and AROM exercises. Hydrostatic pressure also  supports joints by unweighting joint load by at least 50 % in 3-4 feet depth water. 80% in chest to neck deep water. Water will provide assistance with movement using the current and laminar flow while the buoyancy reduces weight bearing. Pt requires the viscosity  of the water for resistance with strengthening exercises.  [facility] Adult PT Treatment:                                                DATE: 01/10/22 Therapeutic Exercise: Nustep LE only L5, 6 min  Leg extension 15 lbs x 20 , 5lbs right x 10 Hamstring curl 20 lbs x  20 right Leg Press 80# 10 x 2  Step up 6 inch 2 x 10 right  4inch retro step up x 10 4inch heel strike x 10 SLS  SLS on airex  Stretching to R hamstring, prone quad,  ITB with strap, bilateral. 30 sec x 2 each  Figure 4 stretch right - pain lateral knee  and lateral ankle  S/L Hip abd  x 20 each   [facility] Adult PT Treatment:                                                DATE: 01/03/22 Therapeutic Exercise: Nustep LE only L6, 6 min  S/L Hip abd green band x 20, clam x 20  Bridging with band x 15  Sit to stand with GTB and 10 lbs x 10 slow, min clicking and soreness Rt knee  Wall sit 15 sec x 4 , partial Leg extension 15 lbs x 15, 10 lbs , x 10  Hamstring curl 35 lbs x 15  Stretching to hamstring, adductor and ITB with strap, bilateral. 30 sec x 1 each  Self Care: Advised to try a modified routine at the gym to see if she can reintroduce it.  Slow down and focus.    [facility] Adult PT Treatment:                                                DATE: 12/30/21 Therapeutic Exercise: Nustep 5 mins L5 UE/LE Straight Leg Raise with Quad Set 10 reps 3 sec hold Supine Bridge 10 reps - 5 seconds hold SL Clamshell  2x10 reps L and R BluTB, medial R knee pain  c R leg ER Supine clams 2x10 bilat BluTB, no R knee pain Seated hamstring 1x 30 sec Supine hamstring stretch 2x 30" c strap Prone R quad stretch 2x30" c strap Self Care: Instruction and application of CFM to the medial  patella border and joint line with pt returning demonstration     PATIENT EDUCATION:  Education details: Diagnosis, Prognosis, HEP, POC, LEFS Person educated: Patient Education method: Explanation, Demonstration, Tactile cues, Verbal cues, and Handouts Education comprehension: verbalized understanding, returned demonstration, verbal cues required, and tactile cues required     HOME EXERCISE PROGRAM:  Access Code: [email or web address] URL: [email or web address] Date: 01/03/2022 Prepared by: Raeford Razor  Exercises - Supine Quad Set  - 2 x daily - 7 x weekly - 2 sets - 10 reps - 5 seconds hold - Active Straight Leg Raise with Quad Set  - 1-2 x daily - 7 x weekly - 1-2 sets - 10 reps - Supine Bridge  - 1-2 x daily - 7 x weekly - 1-2 sets - 10 reps - 5 seconds  hold - Hooklying Clamshell with Resistance  - 1-2 x daily - 7 x weekly - 1-2 sets - 10 reps - 3 hold - Hooklying Hamstring Stretch with Strap  - 1-2 x daily - 7 x weekly - 1 sets - 3 reps - 30 hold - Prone Quadriceps Stretch with Strap  - 1-2 x daily - 7 x weekly - 1 sets - 3 reps - 30 hold - Supine ITB Stretch with Strap  - 1-2 x daily - 7 x weekly - 1 sets - 3 reps - 30 hold  ASSESSMENT:   CLINICAL IMPRESSION: Patient presents to aquatic session with no current pain in her knee and reports no recent issues with going to the gym. Session today focused on LE strengthening and general conditioning in the aquatic environment for use of buoyancy to offload joints and the viscosity of water as resistance during therapeutic exercise. Patient was able to tolerate all prescribed exercises in the aquatic environment with no adverse effects and reports 0/10 pain at the end of the session. Patient continues to benefit from skilled PT services on land and aquatic based and should be progressed as able to improve functional independence.     OBJECTIVE IMPAIRMENTS decreased strength, impaired perceived functional ability, improper body  mechanics, and pain.    ACTIVITY LIMITATIONS squatting and stairs   PARTICIPATION LIMITATIONS: community activity   PERSONAL FACTORS Fitness and 1-2 comorbidities: HLD, kidney disease  are also affecting patient's functional outcome.    REHAB POTENTIAL: Good   CLINICAL DECISION MAKING: Stable/uncomplicated   EVALUATION COMPLEXITY: Low     GOALS: Goals reviewed with patient? No   SHORT TERM GOALS: Target date: 01/07/2022  Pt will be independent with initial HEP. Baseline: given at eval Goal status: MET 01/10/22 2.  LE MMT will be assessed with LTG created as appropriate. Baseline: not taken Goal status: PARTIALLY MET 01/03/22     LONG TERM GOALS: Target date: 02/11/2022    Pt will be independent with advanced HEP to continue with progression of strength. Baseline: not provided yet Status 01/10/22: independent Goal status: MET   2.  Pt will increase LEFS to 80/80, demonstrating no functional deficits. Baseline: 75/80 Goal status: ongoing    3.  Pt will return to gym routine with no R knee pain or issues.  Baseline: has not returned Status 01/10/22: can complete 8 minutes on elliptical without pain Goal status: ongoing   4.  Pt will negotiate stairs safely with no pain of feeling of knee instability. Baseline: has not felt instability , gets tired easier Status: 01/10/22: bouts of instability intermittently Goal status: ongoing       PLAN: PT FREQUENCY: 1-2x/week   PT DURATION: 8 weeks   PLANNED INTERVENTIONS: Therapeutic exercises, Therapeutic activity, Neuromuscular re-education, Balance training, Gait training, Patient/Family education, Joint mobilization, Stair training, Dry Needling, Cryotherapy, Moist heat, Taping, Manual therapy, and Re-evaluation   PLAN FOR NEXT SESSION: assess return to gym machines on knee,  balance , eccentric quad strength, Figure 4 stretch   Check all possible CPT codes: 13143 - PT Re-evaluation, 97110- Therapeutic Exercise, [phone]- Neuro  Re-education, [phone] - Gait Training, [phone] - Manual Therapy, [phone] - Therapeutic Activities, and 97535 - [place], PTA 01/14/22 4:08 PM

## 2022-01-14 ENCOUNTER — Ambulatory Visit: Payer: Medicaid Other

## 2022-01-14 DIAGNOSIS — R262 Difficulty in walking, not elsewhere classified: Secondary | ICD-10-CM

## 2022-01-14 DIAGNOSIS — M25561 Pain in right knee: Secondary | ICD-10-CM | POA: Diagnosis not present

## 2022-01-14 DIAGNOSIS — M6281 Muscle weakness (generalized): Secondary | ICD-10-CM

## 2022-01-31 ENCOUNTER — Ambulatory Visit: Payer: Medicaid Other | Attending: Family Medicine | Admitting: Physical Therapy

## 2022-01-31 ENCOUNTER — Encounter: Payer: Self-pay | Admitting: Physical Therapy

## 2022-01-31 DIAGNOSIS — M6281 Muscle weakness (generalized): Secondary | ICD-10-CM | POA: Insufficient documentation

## 2022-01-31 DIAGNOSIS — M25561 Pain in right knee: Secondary | ICD-10-CM | POA: Insufficient documentation

## 2022-01-31 DIAGNOSIS — R262 Difficulty in walking, not elsewhere classified: Secondary | ICD-10-CM | POA: Insufficient documentation

## 2022-01-31 NOTE — Therapy (Signed)
OUTPATIENT PHYSICAL THERAPY TREATMENT NOTE DISCHARGE   Patient Name: Alexis Howell MRN: [id number] DOB:Aug 24, 1980, 41 y.o., female Today's Date: 01/31/2022  PCP: Nadyne Coombes, MD REFERRING PROVIDER: Nadyne Coombes, MD  END OF SESSION:   PT End of Session - 01/31/22 0846     Visit Number 7    Number of Visits 12    Date for PT Re-Evaluation 02/11/22    Authorization Type [employer]    Authorization Time Period 12/18/21-01/16/22    PT Start Time 0845    PT Stop Time 0925    PT Time Calculation (min) 40 min    Activity Tolerance Patient tolerated treatment well    Behavior During Therapy [facility] for tasks assessed/performed                Past Medical History:  Diagnosis Date   Delivery of pregnancy by cesarean section: Indication: arrest of dilation 11/26/2016   Hyperlipemia    Kidney stones    Obesity    Pancreatitis    PCOS (polycystic ovarian syndrome)    Prolonged first stage (of labor) 11/26/2016   Past Surgical History:  Procedure Laterality Date   BIOPSY  09/09/2021   Procedure: BIOPSY;  Surgeon: Rachael Fee, MD;  Location: [facility];  Service: Endoscopy;;   CESAREAN SECTION N/A 11/26/2016   Procedure: CESAREAN SECTION;  Surgeon: Noland Fordyce, MD;  Location: [facility] BIRTHING SUITES;  Service: Obstetrics;  Laterality: N/A;   CHOLECYSTECTOMY N/A 08/12/2014   Procedure: LAPAROSCOPIC CHOLECYSTECTOMY WITH INTRAOPERATIVE CHOLANGIOGRAM;  Surgeon: Atilano Ina, MD;  Location: [facility];  Service: General;  Laterality: N/A;   ESOPHAGOGASTRODUODENOSCOPY (EGD) WITH PROPOFOL N/A 09/09/2021   Procedure: ESOPHAGOGASTRODUODENOSCOPY (EGD) WITH PROPOFOL;  Surgeon: Rachael Fee, MD;  Location: [facility];  Service: Endoscopy;  Laterality: N/A;   TONSILLECTOMY AND ADENOIDECTOMY     WISDOM TOOTH EXTRACTION     Patient Active Problem List   Diagnosis Date Noted   Unspecified ovarian cyst, left side 07/07/2021   Polycystic ovaries 07/07/2021   Hx of pancreatitis  07/07/2021   BMI 45.0-49.9, adult (HCC) 10/03/2017    REFERRING DIAG: (R) Knee Sprain  THERAPY DIAG:  Acute pain of right knee  Difficulty in walking, not elsewhere classified  Muscle weakness (generalized)  Rationale for Evaluation and Treatment Rehabilitation  PERTINENT HISTORY: L Knee injury early 2000's - no trouble now  PRECAUTIONS: None  SUBJECTIVE: No pain .  The only thing I cant so is sidestep up with my Rt.  I can do 14 min elliptical.  Doing machines as well.  Ready for DC.     PAIN:  Are you having pain? No: NPRS scale: 0/10 Pain location: R knee Pain description: tweak Aggravating factors: too long on the elliptical Relieving factors: Rest, ice   OBJECTIVE: (objective measures completed at initial evaluation unless otherwise dated)   DIAGNOSTIC FINDINGS: R Knee XR: no acute osseous abnormality, small joint effusion   PATIENT SURVEYS:  LEFS 75/80 LEFS eval: 80/80   COGNITION:           Overall cognitive status: Within functional limits for tasks assessed                          SENSATION: WFL   EDEMA:  Circumferential: 17.5 in B   MUSCLE LENGTH: Hamstrings: Right NA deg; Left NA deg Thomas test: Right NA deg; Left NA deg   POSTURE: No Significant postural limitations   PALPATION: unremarkable  LOWER EXTREMITY ROM:   Active ROM Right 12/17/21 Left 12/17/21 Rt  01/31/22  Hip flexion       Hip extension       Hip abduction       Hip adduction       Hip internal rotation       Hip external rotation       Knee flexion 120 121 121  Knee extension 3 3 NT  Ankle dorsiflexion       Ankle plantarflexion       Ankle inversion       Ankle eversion        (Blank rows = not tested)   LOWER EXTREMITY MMT:   MMT 12/17/21 Not tested Right 01/03/22 Left 01/03/22 Rt  01/31/22  Hip flexion  $Remove'5   5 5  '[id number]$ Hip extension        Hip abduction  4+   4+ 5  Hip adduction        Hip internal rotation        Hip external rotation        Knee flexion   '5   5 5  '$ Knee extension   '5  5 5  '$ Ankle dorsiflexion        Ankle plantarflexion        Ankle inversion        Ankle eversion         (Blank rows = not tested)   LOWER EXTREMITY SPECIAL TESTS:  Knee special tests: Valgus/varus stress (-), flexion and extension with OP (-)   FUNCTIONAL TESTS:  Squatting: B hip ER with R knee audibly cracking during concentric phase   GAIT: Distance walked: 50 ft Assistive device utilized: None Level of assistance: Complete Independence Comments: WNL       TODAY'S TREATMENT:  [facility] Adult PT Treatment:                                                DATE: 01/31/22 Therapeutic Exercise: Recumbent bike L2 for 5 min  3 way hip stretch, 30 sec  Bridge x 10  Figure (SL) bridge  x 6 each side , some pain with Rt leg over L thigh  Lateral band walking green long TB x 5 x 10 feet Lateral lunge with counter support x 10  Knee extension 15 lbs x 15 Rt LE eccentric , then concentric/eccentric 15 lbs Knee flexion 25 lbs x 20      PATIENT EDUCATION:  Education details: Diagnosis, Prognosis, HEP, POC, LEFS Person educated: Patient Education method: Explanation, Demonstration, Tactile cues, Verbal cues, and Handouts Education comprehension: verbalized understanding, returned demonstration, verbal cues required, and tactile cues required     HOME EXERCISE PROGRAM:  Access Code: [email or web address] URL: [email or web address] Date: 01/03/2022 Prepared by: Raeford Razor  Exercises - Supine Quad Set  - 2 x daily - 7 x weekly - 2 sets - 10 reps - 5 seconds hold - Active Straight Leg Raise with Quad Set  - 1-2 x daily - 7 x weekly - 1-2 sets - 10 reps - Supine Bridge  - 1-2 x daily - 7 x weekly - 1-2 sets - 10 reps - 5 seconds hold - Hooklying Clamshell with Resistance  - 1-2 x daily - 7 x weekly - 1-2 sets - 10 reps - 3 hold - Hooklying Hamstring  Stretch with Strap  - 1-2 x daily - 7 x weekly - 1 sets - 3 reps - 30 hold - Prone Quadriceps Stretch with  Strap  - 1-2 x daily - 7 x weekly - 1 sets - 3 reps - 30 hold - Supine ITB Stretch with Strap  - 1-2 x daily - 7 x weekly - 1 sets - 3 reps - 30 hold -lateral band walking    ASSESSMENT:   CLINICAL IMPRESSION: Patient has joined [employer] and is loving it. She has met her goals but has not been able to do > 15 min on ellipical yet.  She is increasing her time by 2 min each week.  She continues to have min discomfort with Rt LE lateral movement but it does not interrupt her normal mobility tasks or ADLs.  DC from PT     OBJECTIVE IMPAIRMENTS decreased strength, impaired perceived functional ability, improper body mechanics, and pain.    ACTIVITY LIMITATIONS squatting and stairs   PARTICIPATION LIMITATIONS: community activity   PERSONAL FACTORS Fitness and 1-2 comorbidities: HLD, kidney disease  are also affecting patient's functional outcome.    REHAB POTENTIAL: Good   CLINICAL DECISION MAKING: Stable/uncomplicated   EVALUATION COMPLEXITY: Low     GOALS: Goals reviewed with patient? No   SHORT TERM GOALS: Target date: 01/07/2022  Pt will be independent with initial HEP. Baseline: given at eval Goal status: MET 01/10/22 2.  LE MMT will be assessed with LTG created as appropriate. Baseline: not taken Goal status: MET 01/03/22     LONG TERM GOALS: Target date: 02/11/2022    Pt will be independent with advanced HEP to continue with progression of strength. Baseline: not provided yet Status 01/10/22: independent Goal status: MET   2.  Pt will increase LEFS to 80/80, demonstrating no functional deficits. Baseline: 75/80 Goal status: MET  80/80   3.  Pt will return to gym routine with no R knee pain or issues.  Baseline: has not returned Status 01/10/22: can complete 14 minutes on elliptical without pain Goal status: partially met, working towards 20 min   4.  Pt will negotiate stairs safely with no pain of feeling of knee instability. Baseline: has not felt instability , gets  tired easier Goal status: MET      PLAN: PT FREQUENCY: 1-2x/week   PT DURATION: 8 weeks   PLANNED INTERVENTIONS: Therapeutic exercises, Therapeutic activity, Neuromuscular re-education, Balance training, Gait training, Patient/Family education, Joint mobilization, Stair training, Dry Needling, Cryotherapy, Moist heat, Taping, Manual therapy, and Re-evaluation   PLAN FOR NEXT SESSION: NA, DC    Check all possible CPT codes: [id number] - PT Re-evaluation, 97110- Therapeutic Exercise, [phone]- Neuro Re-education, [phone] - Gait Training, [phone] - Manual Therapy, [phone] - Therapeutic Activities, and [address]7535 - [place], PT 01/31/22 1:14 PM Phone: [phone] Fax: [phone]

## 2022-04-08 IMAGING — CT CT ABD-PELV W/ CM
2 of 12 series · 12 of 46 positions shown, 14 images · IV contrast (agent unspecified)
Comparison: None.

CLINICAL DATA: Chronic upper abdominal pain. History of
pancreatitis.

EXAM:
CT ABDOMEN AND PELVIS WITH CONTRAST
TECHNIQUE: Multidetector CT imaging of the abdomen and pelvis was performed
using the standard protocol following bolus administration of
intravenous contrast.

[Series 8: cor arterial · coronal · arterial · 0.71mm/px · 3 of 179 slices shown]
[im 45/179  soft-tissue]
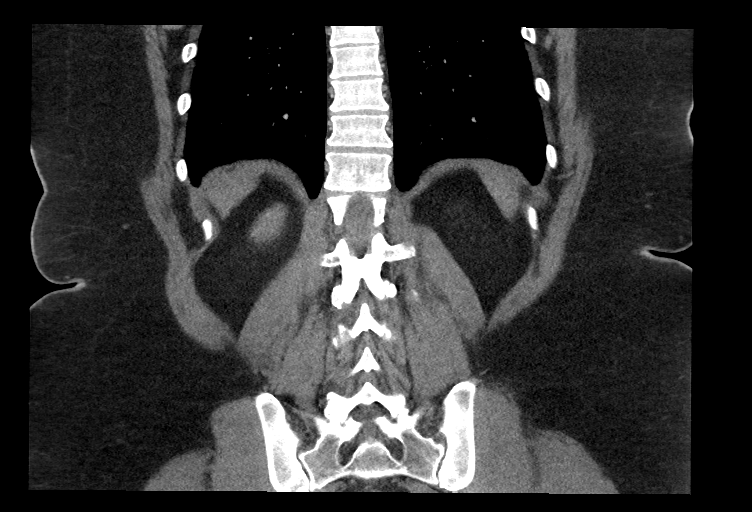
[im 90/179  soft-tissue]
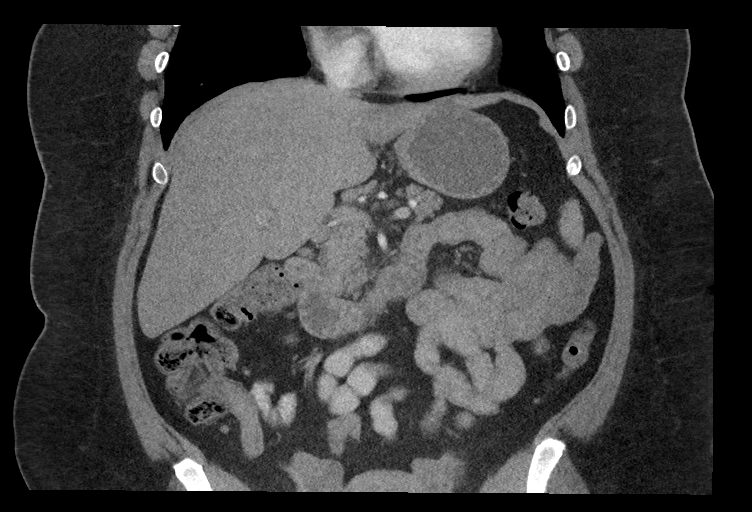
[im 134/179  soft-tissue]
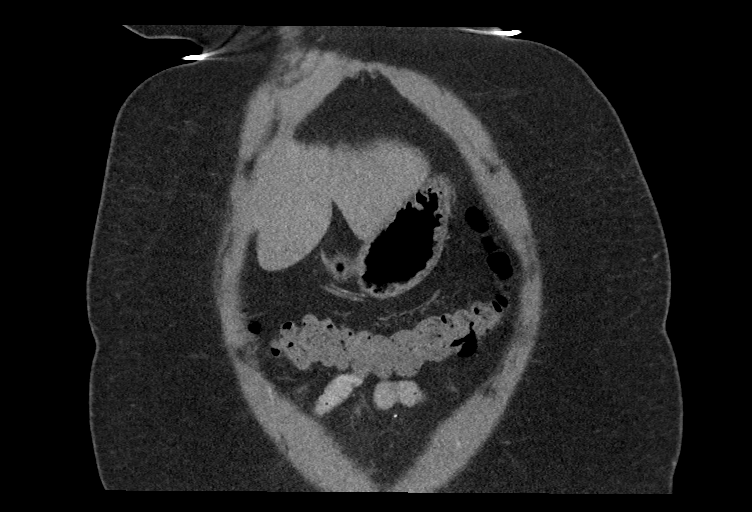

[Series 12: portal pv 2.0 · axial · portal-venous · 0.98mm/px · z∈[-537,-139]mm · 9 of 249 slices shown, 11 images]
[im 25/249  soft-tissue]
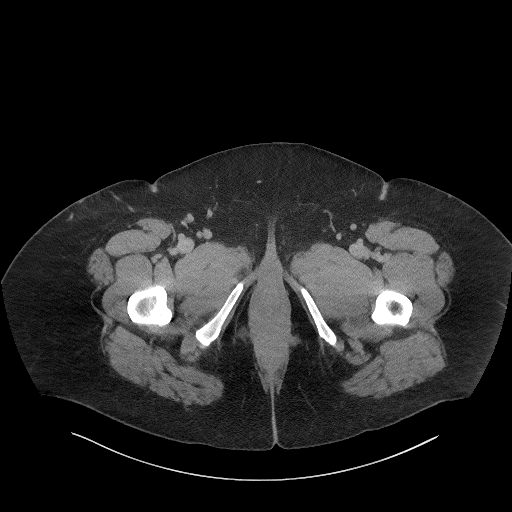
[im 25/249  bone]
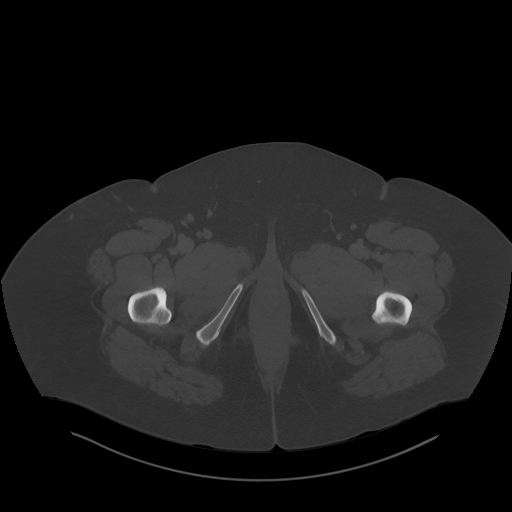
[im 50/249  soft-tissue]
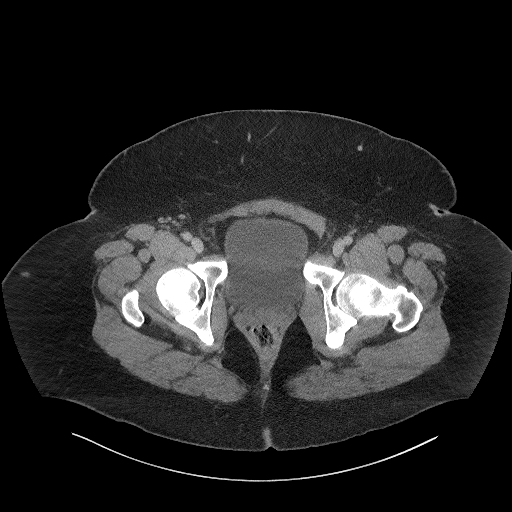
[im 75/249  soft-tissue]
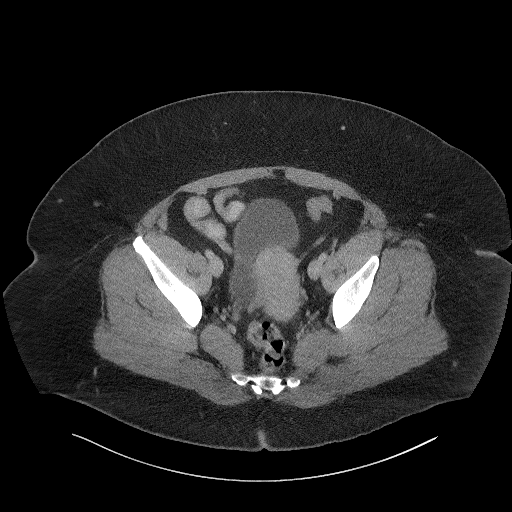
[im 100/249  soft-tissue]
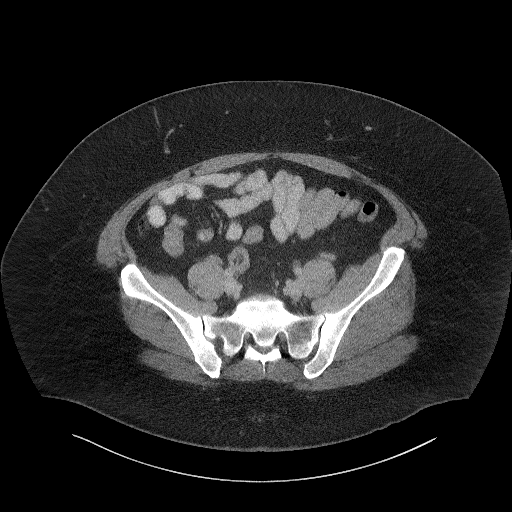
[im 125/249  soft-tissue]
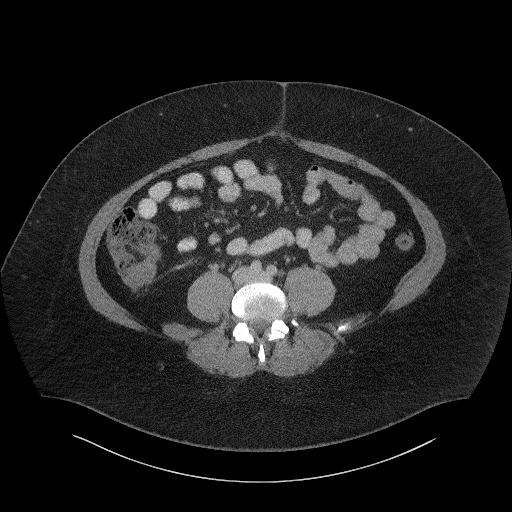
[im 149/249  soft-tissue]
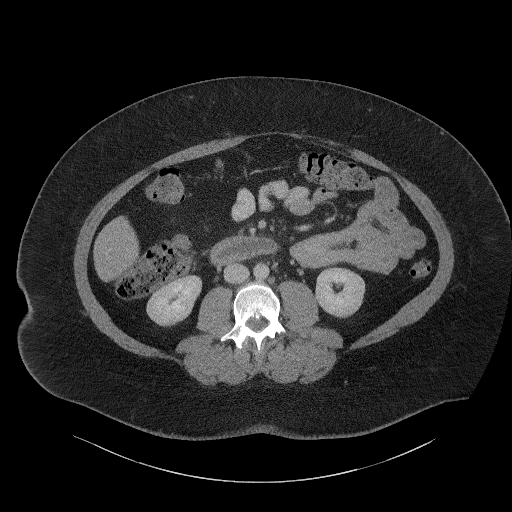
[im 174/249  soft-tissue]
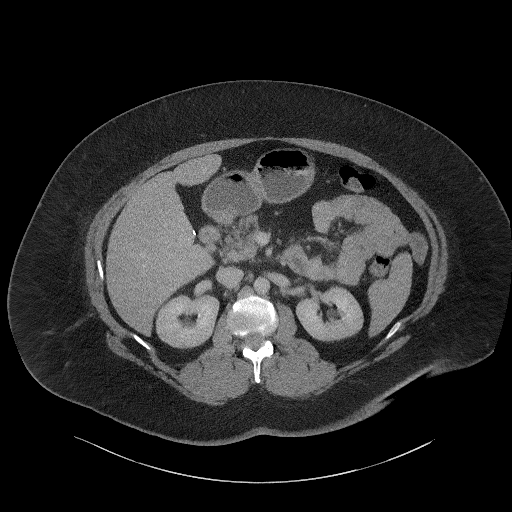
[im 199/249  soft-tissue]
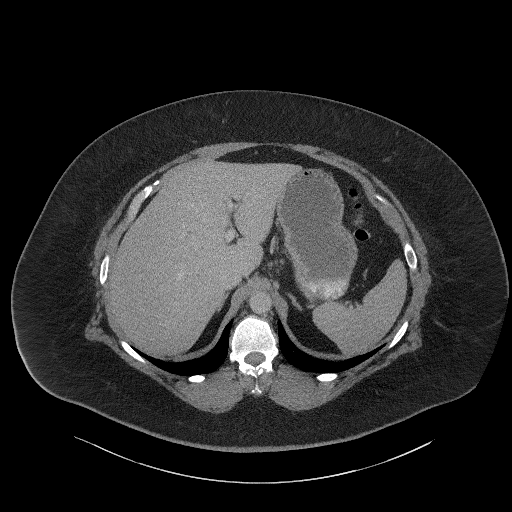
[im 224/249  soft-tissue]
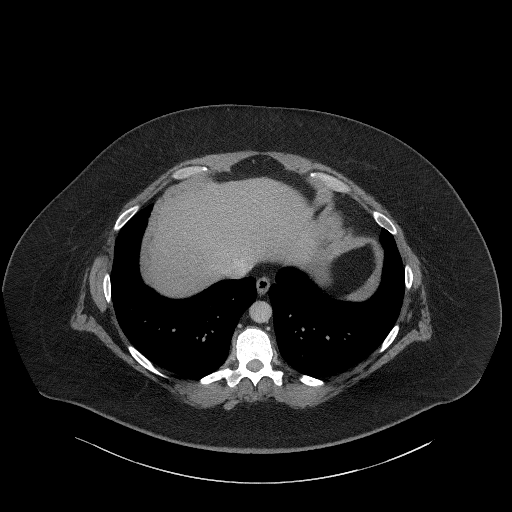
[im 224/249  bone]
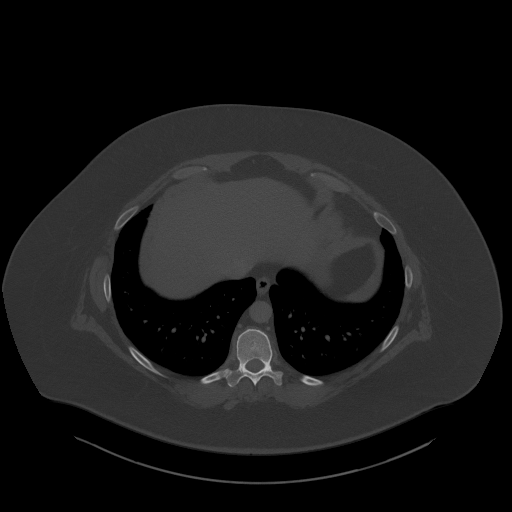

[12 of 46 positions shown; findings below may reference images not displayed]

RADIATION DOSE REDUCTION: This exam was performed according to the
departmental dose-optimization program which includes automated
exposure control, adjustment of the mA and/or kV according to
patient size and/or use of iterative reconstruction technique.

CONTRAST:  100mL OMNIPAQUE IOHEXOL 300 MG/ML  SOLN
FINDINGS: Lower chest: No acute abnormality.

Hepatobiliary: No focal liver abnormality is seen. Status post
cholecystectomy. No biliary dilatation.

Pancreas: Partially infiltrated with fat but otherwise unremarkable.
No pancreatic ductal dilatation or surrounding inflammatory change.

Spleen: Normal in size without focal abnormality.

Adrenals/Urinary Tract: Adrenal glands appear normal. Kidneys appear
normal without mass, stone or hydronephrosis. No perinephric
inflammation. No ureteral or bladder calculi are identified. Bladder
appears normal.

Stomach/Bowel: No dilated large or small bowel loops. No evidence of
bowel wall inflammation. Appendix is normal. Stomach is
unremarkable.

Vascular/Lymphatic: Small lymph nodes scattered within the
mesentery, upper abdomen and retroperitoneum, fairly numerous. No
enlarged lymph nodes identified.

Reproductive: Uterus and bilateral adnexa are unremarkable.

Other: No free fluid or abscess collection. No free intraperitoneal
air.

Musculoskeletal: No osseous abnormality seen. Superficial soft
tissues are unremarkable.
IMPRESSION: 1. Small lymph nodes scattered within the central mesentery, upper
abdomen and retroperitoneum, abnormal by number, but no enlarged
lymph nodes present. This mild lymphadenopathy is a nonspecific
finding but can be seen in the setting of a mesenteric adenitis or
possibly residual reactive lymphadenopathy related to the earlier
reported episode of pancreatitis. Recommend follow-up CT in 3-6
months to ensure stability or resolution and exclude the possibility
of this representing early lymphoma. Alternatively, could consider
nonemergent PET-CT now for further characterization.
2. Remainder of the abdomen and pelvis CT is unremarkable. No bowel
obstruction or evidence of bowel wall inflammation. No evidence of
pancreatitis. No renal or ureteral calculi. Appendix is normal.

## 2023-07-28 ENCOUNTER — Ambulatory Visit: Payer: Medicaid Other | Admitting: Gastroenterology

## 2023-07-28 ENCOUNTER — Encounter: Payer: Self-pay | Admitting: Gastroenterology

## 2023-07-28 VITALS — BP 122/84 | HR 89 | Ht 64.0 in | Wt 310.0 lb

## 2023-07-28 DIAGNOSIS — K644 Residual hemorrhoidal skin tags: Secondary | ICD-10-CM | POA: Diagnosis not present

## 2023-07-28 DIAGNOSIS — K649 Unspecified hemorrhoids: Secondary | ICD-10-CM

## 2023-07-28 DIAGNOSIS — K625 Hemorrhage of anus and rectum: Secondary | ICD-10-CM | POA: Diagnosis not present

## 2023-07-28 DIAGNOSIS — R1012 Left upper quadrant pain: Secondary | ICD-10-CM

## 2023-07-28 DIAGNOSIS — G8929 Other chronic pain: Secondary | ICD-10-CM

## 2023-07-28 DIAGNOSIS — K921 Melena: Secondary | ICD-10-CM

## 2023-07-28 DIAGNOSIS — R109 Unspecified abdominal pain: Secondary | ICD-10-CM

## 2023-07-28 MED ORDER — HYOSCYAMINE SULFATE 0.125 MG SL SUBL: 0.1250 mg | SUBLINGUAL_TABLET | SUBLINGUAL | 0 refills | Status: DC | PRN

## 2023-07-28 MED ORDER — SUFLAVE 178.7 G PO SOLR: 1.0000 | Freq: Once | ORAL | 0 refills | Status: AC

## 2023-07-28 NOTE — Progress Notes (Signed)
 Chief Complaint: LUQ pain, hemorrhoids, rectal bleeding Primary GI MD: Dr. Teressa  HPI: Discussed the use of AI scribe software for clinical note transcription with the patient, who gave verbal consent to proceed.  History of Present Illness   Alexis Howell  is a 43 year old female who presents with hemorrhoids and chronic side pain. She was referred by her primary care doctor for hemorrhoid treatment (banding specifically)  Patient was last seen 06/2021 by Vina Dasen, NP.  She reported chronic RUQ abdominal pain and there was a discussion about history of acute pancreatitis December 2022 unknown etiology.  She underwent EGD which showed H. pylori negative gastritis.  She also had CT abdomen pelvis with contrast which showed lymph nodes scattered within the abdomen thought to be mesenteric adenitis or reactive lymphadenopathy due to previous episode of pancreatitis.  She did have a repeat CT scan November 2024 which showed a normal pancreas and no evidence of enlarged lymph nodes.  --------TODAY--------------------------------  She has experienced hemorrhoids since the birth of her son 6 years ago. The hemorrhoid is external and visible, causing occasional bleeding, particularly during heavy exercise or stress, without associated pain. She seeks treatment to have the hemorrhoid banded.  She has chronic side pain for over a year, attributed to her PCOS and kidney stones. Multiple evaluations, including CT scans and a scope, were clear for kidney stones. The pain starts in the mid-back, wraps around to the left upper abdomen, and is sometimes accompanied by swelling and numbness. The pain can last for weeks and is not triggered by movement or eating. She has tried Tylenol  and ibuprofen  without relief.  Her past medical history includes gallbladder removal in 2017 due to gallstones.  She also reports significant weight gain in 2021, which she attributes to a failed keto diet that  negatively impacted her cholesterol levels.  She mentions a family history of diverticulitis in her mother and sister. She describes herself as having a high pain tolerance and not being very 'body aware'.   Reports no issues with her bowel movements.  Denies family history of colon cancer.  Denies previous colonoscopy.     PREVIOUS GI WORKUP   Ct ab/pelvis 04/2023 IMPRESSION: No acute findings or other significant abnormality. No radiographic evidence of urinary tract neoplasm, urolithiasis, or hydronephrosis.  CTAP w contrast 07/2021 IMPRESSION: 1. Small lymph nodes scattered within the central mesentery, upper abdomen and retroperitoneum, abnormal by number, but no enlarged lymph nodes present. This mild lymphadenopathy is a nonspecific finding but can be seen in the setting of a mesenteric adenitis or possibly residual reactive lymphadenopathy related to the earlier reported episode of pancreatitis. Recommend follow-up CT in 3-6 months to ensure stability or resolution and exclude the possibility of this representing early lymphoma. Alternatively, could consider nonemergent PET-CT now for further characterization. 2. Remainder of the abdomen and pelvis CT is unremarkable. No bowel obstruction or evidence of bowel wall inflammation. No evidence of pancreatitis. No renal or ureteral calculi. Appendix is normal.  EGD 08/2021: upper abdominal pain - Mild, non- specific gastritis. Biopsied to check for H. pylori.  - The examination was otherwise normal.  FINAL MICROSCOPIC DIAGNOSIS:   A.   STOMACH, BODY AND ANTRUM, BIOPSY:  -    Mild reactive gastropathy.  -    Negative for an inflammatory pattern predictive of Helicobacter  pylori infection.  -    Negative for intestinal metaplasia.  -    Negative for malignancy.   Past Medical History:  Diagnosis Date   Delivery of pregnancy by cesarean section: Indication: arrest of dilation 11/26/2016   Hyperlipemia    Kidney stones     Obesity    Pancreatitis    PCOS (polycystic ovarian syndrome)    Prolonged first stage (of labor) 11/26/2016    Past Surgical History:  Procedure Laterality Date   BIOPSY  09/09/2021   Procedure: BIOPSY;  Surgeon: Teressa Toribio SQUIBB, MD;  Location: [facility];  Service: Endoscopy;;   CESAREAN SECTION N/A 11/26/2016   Procedure: CESAREAN SECTION;  Surgeon: Kandyce Sor, MD;  Location: [facility]IRTHING SUITES;  Service: Obstetrics;  Laterality: N/A;   CHOLECYSTECTOMY N/A 08/12/2014   Procedure: LAPAROSCOPIC CHOLECYSTECTOMY WITH INTRAOPERATIVE CHOLANGIOGRAM;  Surgeon: Camellia CHRISTELLA Blush, MD;  Location: [facility];  Service: General;  Laterality: N/A;   ESOPHAGOGASTRODUODENOSCOPY (EGD) WITH PROPOFOL  N/A 09/09/2021   Procedure: ESOPHAGOGASTRODUODENOSCOPY (EGD) WITH PROPOFOL ;  Surgeon: Teressa Toribio SQUIBB, MD;  Location: [facility];  Service: Endoscopy;  Laterality: N/A;   TONSILLECTOMY AND ADENOIDECTOMY     WISDOM TOOTH EXTRACTION      Current Outpatient Medications  Medication Sig Dispense Refill   Barberry-Oreg Grape-Goldenseal (BERBERINE COMPLEX PO) Take 200 mg by mouth daily. Turmeric     hyoscyamine  (LEVSIN  SL) 0.125 MG SL tablet Place 1 tablet (0.125 mg total) under the tongue every 4 (four) hours as needed. 30 tablet 0   Red Yeast Rice Extract (RED YEAST RICE PO) Take 1,215 mg by mouth daily. Co Q 10     SUFLAVE  178.7 g SOLR Take 1 kit by mouth once for 1 dose. 1 each 0   ASHWAGANDHA PO Take 1,000 mg by mouth daily. (Patient not taking: Reported on 07/28/2023)     spironolactone (ALDACTONE) 50 MG tablet Take 50 mg by mouth daily. (Patient not taking: Reported on 07/28/2023)     No current facility-administered medications for this visit.    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   Lives with her husband, their son (born 11/26/2016) and dog and cat.   Family is spread out: parents in [place] , sister in [place], born in [place]).   Husband's family is local.   Social Drivers of Corporate Investment Banker Strain: Not on [employer]  Insecurity: Not on file  Transportation Needs: Not on file  Physical Activity: Not on file  Stress: Not on file  Social Connections: Unknown (11/01/2021)   [facility] from [facility]   Social Network    Social Network: Not on file  Intimate Partner Violence: Unknown (09/23/2021)   Received from [facility], [facility]   HITS    Physically Hurt: Not on file    Insult or Talk Down To: Not on file    Threaten Physical Harm: Not on file    Scream or Curse: Not on file    Review of Systems:    Constitutional: No weight loss, fever, chills, weakness or fatigue HEENT: Eyes: No change in vision               Ears, Nose, Throat:  No change in hearing or congestion Skin: No rash or itching Cardiovascular: No chest pain, chest pressure or palpitations   Respiratory: No SOB or cough Gastrointestinal:  See HPI and otherwise negative Genitourinary: No dysuria or change in urinary frequency Neurological: No headache, dizziness or syncope Musculoskeletal: No new muscle or joint pain Hematologic: No bleeding or bruising Psychiatric: No history of depression or anxiety    Physical Exam:  Vital signs: BP 122/84   Pulse 89   Ht 5' 4 (1.626 m)   Wt (!) 310 lb (140.6 kg)   Breastfeeding No   BMI 53.21 kg/m   Constitutional: NAD, Well developed, Well nourished, alert and cooperative Head:  Normocephalic and atraumatic. Eyes:   PEERL, EOMI. No icterus. Conjunctiva pink. Respiratory: Respirations even and unlabored. Lungs clear to auscultation bilaterally.   No wheezes, crackles, or rhonchi.  Cardiovascular:  Regular rate and rhythm. No peripheral edema, cyanosis or pallor.  Gastrointestinal:  Soft, nondistended, nontender. No rebound or guarding. Normal bowel sounds. No appreciable masses or hepatomegaly. Rectal:  Not performed.  Msk:  Symmetrical without gross deformities. Without edema, no deformity or joint abnormality.  Neurologic:  Alert and  oriented x4;  grossly normal neurologically.  Skin:   Dry and intact without significant lesions or rashes. Psychiatric: Oriented to person, place and time. Demonstrates good judgement and reason without abnormal affect or behaviors.   RELEVANT LABS AND IMAGING: CBC    Component Value Date/Time   WBC 7.0 06/14/2021 0000   WBC 19.9 (H) 11/27/2016 0501   RBC 5.2 (A) 06/14/2021 0000   HGB 14.0 06/14/2021 0000   HCT 42 06/14/2021 0000   PLT 368 06/14/2021 0000   MCV 83.4 11/27/2016 0501   MCV 86.1 07/20/2014 1046   MCH 27.9 11/27/2016 0501   MCHC 33.4 11/27/2016 0501   RDW 16.3 (H) 11/27/2016 0501    CMP     Component Value Date/Time   NA 138 06/14/2021 0000   K 5.1 06/14/2021 0000   CL 100 06/14/2021 0000   CO2 29 (A) 06/14/2021 0000   GLUCOSE 86 07/30/2014 1136   BUN 13 06/14/2021 0000   CREATININE 0.9 06/14/2021 0000    CREATININE 0.84 07/30/2014 1136   CREATININE 0.86 07/20/2014 1043   CALCIUM 9.6 06/14/2021 0000   PROT 7.6 07/20/2014 1043   ALBUMIN 4.4 06/14/2021 0000   AST 42 (A) 06/14/2021 0000   ALT 40 (A) 06/14/2021 0000   ALKPHOS 84 06/14/2021 0000   BILITOT 0.6 07/20/2014 1043   GFRNONAA 82 06/14/2021 0000   GFRAA 94 06/14/2021 0000     Assessment/Plan:      Hemorrhoids Rectal bleeding External hemorrhoid with occasional  bleeding, no pain.  Discussed that we do not band external hemorrhoids.  Declined rectal exam today deferred to colonoscopy.  Due to persistent intermittent rectal bleeding may be best to proceed with colonoscopy for further evaluation.  If internal hemorrhoids are present we can consider banding postprocedure.   -Schedule colonoscopy at hospital due to BMI - I thoroughly discussed the procedure with the patient (at bedside) to include nature of the procedure, alternatives, benefits, and risks (including but not limited to bleeding, infection, perforation, anesthesia/cardiac pulmonary complications).  Patient verbalized understanding and gave verbal consent to proceed with procedure. - If recurrence of bleeding would recommend hydrocortisone cream for suppositories - If internal hemorrhoids present on colonoscopy can set up for banding  Chronic Abdominal Pain Chronic left upper quadrant pain, previously evaluated with endoscopy and CT scans. No clear etiology identified. Possible chronic pancreatitis or mesenteric adenitis suggested on previous imaging but most recent imaging shows resolution. Pain not responsive to Tylenol  or ibuprofen  and no identifiable trigger.  Not associated with eating.  Less likely to be chronic pancreatitis as no calcifications are imaging associated with eating.  Likely multifactorial including musculoskeletal etiology. --Trial of hyoscyamine  for potential spasm-related pain. --Evaluate during colonoscopy - Reassurance     Assigned to Dr. Leigh  today   Nestor Blower, PA-C  Gastroenterology 07/28/2023, 12:58 PM  Cc: Burney Darice CROME, MD

## 2023-07-28 NOTE — Patient Instructions (Addendum)
 _______________________________________________________  If your blood pressure at your visit was 140/90 or greater, please contact your primary care physician to follow up on this.  _______________________________________________________  If you are age 42 or older, your body mass index should be between 23-30. Your Body mass index is 53.21 kg/m. If this is out of the aforementioned range listed, please consider follow up with your Primary Care Provider.  If you are age 34 or younger, your body mass index should be between 19-25. Your Body mass index is 53.21 kg/m. If this is out of the aformentioned range listed, please consider follow up with your Primary Care Provider.   ________________________________________________________  The [facility] providers would like to encourage you to use MYCHART to communicate with providers for non-urgent requests or questions.  Due to long hold times on the telephone, sending your provider a message by [facility] may be a faster and more efficient way to get a response.  Please allow 48 business hours for a response.  Please remember that this is for non-urgent requests.  _______________________________________________________  Rosine have been scheduled for a colonoscopy. Please follow written instructions given to you at your visit today.   If you use inhalers (even only as needed), please bring them with you on the day of your procedure.  DO NOT TAKE 7 DAYS PRIOR TO TEST- Trulicity (dulaglutide) Ozempic, Wegovy (semaglutide) Mounjaro (tirzepatide) Bydureon Bcise (exanatide extended release)  DO NOT TAKE 1 DAY PRIOR TO YOUR TEST Rybelsus (semaglutide) Adlyxin (lixisenatide) Victoza (liraglutide) Byetta (exanatide) ___________________________________________________________________________  Rosine will receive your bowel preparation through [facility], which ensures the lowest copay and home delivery, with outreach via text or call from an [phone] number.  Please respond promptly to avoid rescheduling of your procedure. If you are interested in alternative options or have any questions regarding your prep, please contact them at [phone] ____________________________________________________________________________  Your Provider Has Sent Your Bowel Prep Regimen To [facility]   [facility] will contact you to verify your information and collect your copay, if applicable. Enjoy the comfort of your home while your prescription is mailed to you, FREE of any shipping charges.   [facility] accepts all major insurance benefits and applies discounts & coupons.  Have additional questions?   Chat: [email or web address] Call: [phone] Email: [email or web address] NCPDP: [id number]  How will [facility] contact you?  With a Welcome phone call,  a Welcome text and a checkout link in text form.  Texts you receive from [phone] Are NOT Spam.  *To set up delivery, you must complete the checkout process via link or speak to one of the patient care representatives. If [facility] is unable to reach you, your prescription may be delayed.  To avoid long hold times on the phone, you may also utilize the secure chat feature on the [facility] website to request that they call you back for transaction completion or to expedite your concerns.  It was a pleasure to see you today!  Thank you for trusting me with your gastrointestinal care!

## 2023-07-28 NOTE — Progress Notes (Signed)
 Agree with assessment and plan as outlined.  She will be added to the wait list for hospital procedure and be contacted for scheduling.  Jan, FYI, this patient is added to the wait list for hospital colonoscopy.

## 2023-08-11 ENCOUNTER — Telehealth: Payer: Self-pay | Admitting: Gastroenterology

## 2023-08-11 ENCOUNTER — Other Ambulatory Visit: Payer: Self-pay

## 2023-08-11 DIAGNOSIS — R1012 Left upper quadrant pain: Secondary | ICD-10-CM

## 2023-08-11 MED ORDER — HYOSCYAMINE SULFATE 0.125 MG SL SUBL: 0.1250 mg | SUBLINGUAL_TABLET | SUBLINGUAL | 0 refills | Status: DC | PRN

## 2023-08-11 NOTE — Telephone Encounter (Signed)
 Patient's medication has been sent to local pharmacy.

## 2023-08-11 NOTE — Telephone Encounter (Signed)
 Patient called and stated that she was prescribed LEVSIN for her muscle spasms, but her pharmacy has not received anything. I advised patient that the medication was sent to [facility]. Patient is want to know if the medication will be sent to her with her prep medication for procedure. Patient is requesting a call back. Please advise.

## 2023-10-02 ENCOUNTER — Telehealth: Payer: Self-pay

## 2023-10-02 ENCOUNTER — Encounter (HOSPITAL_COMMUNITY): Payer: Self-pay | Admitting: Gastroenterology

## 2023-10-02 ENCOUNTER — Other Ambulatory Visit: Payer: Self-pay

## 2023-10-02 NOTE — Telephone Encounter (Signed)
 Procedure:colon Procedure date: 10/09/23 Procedure location: [facility] Arrival Time: 6 Spoke with the patient Y/N: y Any prep concerns? n  Has the patient obtained the prep from the pharmacy ? y Do you have a care partner and transportation: y Any additional concerns? n

## 2023-10-02 NOTE — Progress Notes (Addendum)
 PCP - Dr. Bertell Broach   Cardiologist - no  PPM/ICD -  Device Orders -  Rep Notified -   Chest x-ray -  EKG -  Stress Test -  ECHO -  Cardiac Cath -   Sleep Study -  CPAP -   Fasting Blood Sugar -  Checks Blood Sugar _____ times a day  Blood Thinner Instructions: Aspirin Instructions:  ERAS Protcol - PRE-SURGERY Ensure or G2-   Pt. Has bowel prep instructions  Activity--Able to climb a flight of stairs without CP or SOB Anesthesia review:   Patient denies shortness of breath, fever, cough and chest pain at PAT appointment   All instructions explained to the patient, with a verbal understanding of the material. Patient agrees to go over the instructions while at home for a better understanding. Patient also instructed to self quarantine after being tested for COVID-19. The opportunity to ask questions was provided.

## 2023-10-04 ENCOUNTER — Encounter (HOSPITAL_BASED_OUTPATIENT_CLINIC_OR_DEPARTMENT_OTHER): Payer: Self-pay

## 2023-10-04 ENCOUNTER — Inpatient Hospital Stay (HOSPITAL_BASED_OUTPATIENT_CLINIC_OR_DEPARTMENT_OTHER)
Admission: EM | Admit: 2023-10-04 | Discharge: 2023-10-07 | DRG: 439 | Disposition: A | Discharge status: Home / Self Care | Attending: Internal Medicine | Admitting: Internal Medicine

## 2023-10-04 ENCOUNTER — Emergency Department (HOSPITAL_BASED_OUTPATIENT_CLINIC_OR_DEPARTMENT_OTHER)

## 2023-10-04 ENCOUNTER — Other Ambulatory Visit: Payer: Self-pay

## 2023-10-04 DIAGNOSIS — K859 Acute pancreatitis without necrosis or infection, unspecified: Principal | ICD-10-CM | POA: Diagnosis present

## 2023-10-04 DIAGNOSIS — Z6841 Body Mass Index (BMI) 40.0 and over, adult: Secondary | ICD-10-CM

## 2023-10-04 DIAGNOSIS — E66813 Obesity, class 3: Secondary | ICD-10-CM | POA: Diagnosis present

## 2023-10-04 DIAGNOSIS — R160 Hepatomegaly, not elsewhere classified: Secondary | ICD-10-CM | POA: Diagnosis present

## 2023-10-04 DIAGNOSIS — K8689 Other specified diseases of pancreas: Secondary | ICD-10-CM

## 2023-10-04 DIAGNOSIS — Z87891 Personal history of nicotine dependence: Secondary | ICD-10-CM

## 2023-10-04 DIAGNOSIS — Z8249 Family history of ischemic heart disease and other diseases of the circulatory system: Secondary | ICD-10-CM

## 2023-10-04 DIAGNOSIS — Z79899 Other long term (current) drug therapy: Secondary | ICD-10-CM

## 2023-10-04 DIAGNOSIS — K858 Other acute pancreatitis without necrosis or infection: Principal | ICD-10-CM

## 2023-10-04 DIAGNOSIS — E785 Hyperlipidemia, unspecified: Secondary | ICD-10-CM | POA: Diagnosis present

## 2023-10-04 LAB — CBC
HCT: 42.1 % (ref 36.0–46.0)
Hemoglobin: 13.8 g/dL (ref 12.0–15.0)
MCH: 27.1 pg (ref 26.0–34.0)
MCHC: 32.8 g/dL (ref 30.0–36.0)
MCV: 82.5 fL (ref 80.0–100.0)
Platelets: 348 10*3/uL (ref 150–400)
RBC: 5.1 MIL/uL (ref 3.87–5.11)
RDW: 13.7 % (ref 11.5–15.5)
WBC: 15.3 10*3/uL — ABNORMAL HIGH (ref 4.0–10.5)
nRBC: 0 % (ref 0.0–0.2)

## 2023-10-04 LAB — URINALYSIS, ROUTINE W REFLEX MICROSCOPIC
Bilirubin Urine: NEGATIVE
Glucose, UA: NEGATIVE mg/dL
Ketones, ur: NEGATIVE mg/dL
Leukocytes,Ua: NEGATIVE
Nitrite: NEGATIVE
Protein, ur: NEGATIVE mg/dL
Specific Gravity, Urine: 1.005 (ref 1.005–1.030)
pH: 5.5 (ref 5.0–8.0)

## 2023-10-04 LAB — COMPREHENSIVE METABOLIC PANEL WITH GFR
ALT: 19 U/L (ref 0–44)
AST: 19 U/L (ref 15–41)
Albumin: 4.5 g/dL (ref 3.5–5.0)
Alkaline Phosphatase: 64 U/L (ref 38–126)
Anion gap: 10 (ref 5–15)
BUN: 15 mg/dL (ref 6–20)
CO2: 25 mmol/L (ref 22–32)
Calcium: 9.6 mg/dL (ref 8.9–10.3)
Chloride: 100 mmol/L (ref 98–111)
Creatinine, Ser: 0.82 mg/dL (ref 0.44–1.00)
GFR, Estimated: >60 mL/min (ref >60)
Glucose, Bld: 101 mg/dL — ABNORMAL HIGH (ref 70–99)
Potassium: 4.2 mmol/L (ref 3.5–5.1)
Sodium: 135 mmol/L (ref 135–145)
Total Bilirubin: 0.3 mg/dL (ref 0.0–1.2)
Total Protein: 8 g/dL (ref 6.5–8.1)

## 2023-10-04 LAB — LIPASE, BLOOD: Lipase: 36 U/L (ref 11–51)

## 2023-10-04 LAB — PREGNANCY, URINE: Preg Test, Ur: NEGATIVE

## 2023-10-04 MED ORDER — IOHEXOL 300 MG/ML  SOLN
100.0000 mL | Freq: Once | INTRAMUSCULAR | Status: AC | PRN
  Administered 2023-10-04: 100 mL via INTRAVENOUS

## 2023-10-04 MED ORDER — FENTANYL CITRATE PF 50 MCG/ML IJ SOSY
50.0000 ug | PREFILLED_SYRINGE | Freq: Once | INTRAMUSCULAR | Status: AC
  Administered 2023-10-04: 50 ug via INTRAVENOUS
  Filled 2023-10-04: qty 1

## 2023-10-04 MED ORDER — SODIUM CHLORIDE 0.9 % IV BOLUS
1000.0000 mL | Freq: Once | INTRAVENOUS | Status: AC
  Administered 2023-10-04: 1000 mL via INTRAVENOUS

## 2023-10-04 MED ORDER — FENTANYL CITRATE PF 50 MCG/ML IJ SOSY: 25.0000 ug | PREFILLED_SYRINGE | Freq: Once | INTRAMUSCULAR | Status: DC

## 2023-10-04 MED ORDER — OXYCODONE-ACETAMINOPHEN 5-325 MG PO TABS
1.0000 | ORAL_TABLET | Freq: Once | ORAL | Status: AC
  Administered 2023-10-04: 1 via ORAL
  Filled 2023-10-04: qty 1

## 2023-10-04 MED ORDER — LACTATED RINGERS IV SOLN: INTRAVENOUS | Status: AC

## 2023-10-04 MED ORDER — MORPHINE SULFATE (PF) 4 MG/ML IV SOLN
6.0000 mg | Freq: Once | INTRAVENOUS | Status: AC
  Administered 2023-10-04: 6 mg via INTRAVENOUS
  Filled 2023-10-04: qty 2

## 2023-10-04 NOTE — ED Notes (Signed)
 ED Provider at bedside.

## 2023-10-04 NOTE — ED Provider Notes (Signed)
 [place] EMERGENCY DEPARTMENT AT [facility] Provider Note   CSN: [id number] Arrival date & time: 10/04/23  1418     History  Chief Complaint  Patient presents with   Abdominal Pain   Back Pain    Alexis Howell is a 43 y.o. female with history of PCOS, hyperlipidemia, pancreatitis, cholecystectomy, who presents the emergency department planing of abdominal and flank pain.  Patient states that she has been dealing with intermittent episodes of similar pain for several months.  She sees a urologist, and has already had an endoscopy.  She scheduled for colonoscopy next week.  Usually she has waxing and waning pain in her left flank radiating to the front of her left abdomen and it lasts for 7 to 10 days.  Sometimes it is on the right side.  Today she is complaining of bilateral flank pain, and right sided abdominal pain.  Said no nausea, vomiting, or diarrhea.  Had a normal bowel movement this morning, but did say it smelled "sick".  No hematuria or dysuria.  No vaginal bleeding or discharge.   Abdominal Pain Back Pain Associated symptoms: abdominal pain        Home Medications Prior to Admission medications   Medication Sig Start Date End Date Taking? Authorizing Provider  ASHWAGANDHA PO Take 1,000 mg by mouth daily. Patient not taking: Reported on 07/28/2023    [provider]  Barberry-Oreg Grape-Goldenseal (BERBERINE COMPLEX PO) Take 200 mg by mouth daily. Turmeric    [provider]  hyoscyamine (LEVSIN SL) 0.125 MG SL tablet Place 1 tablet (0.125 mg total) under the tongue every 4 (four) hours as needed. 08/11/23   McMichael, Saddie Benders, PA-C  Red Yeast Rice Extract (RED YEAST RICE PO) Take 1,215 mg by mouth daily. Co Q 10    [provider]  spironolactone (ALDACTONE) 50 MG tablet Take 50 mg by mouth daily. Patient not taking: Reported on 07/28/2023    [provider]      Allergies    Patient has no known allergies.     Review of Systems   Review of Systems  Gastrointestinal:  Positive for abdominal pain.  Genitourinary:  Positive for flank pain.  All other systems reviewed and are negative.   Physical Exam Updated Vital Signs BP 130/88 (BP Location: Right Arm)   Pulse 93   Temp 98 F (36.7 C)   Resp 16   Ht 5\' 4"  (1.626 m)   Wt (!) 139.3 kg   LMP 09/19/2023 (Approximate)   SpO2 100%   BMI 52.71 kg/m  Physical Exam Vitals and nursing note reviewed.  Constitutional:      Appearance: Normal appearance.     Comments: Patient appears uncomfortable, standing/pacing in exam room  HENT:     Head: Normocephalic and atraumatic.  Eyes:     Conjunctiva/sclera: Conjunctivae normal.  Cardiovascular:     Rate and Rhythm: Normal rate and regular rhythm.  Pulmonary:     Effort: Pulmonary effort is normal. No respiratory distress.     Breath sounds: Normal breath sounds.  Abdominal:     General: There is no distension.     Palpations: Abdomen is soft.     Tenderness: There is abdominal tenderness in the right upper quadrant. There is right CVA tenderness and left CVA tenderness.  Skin:    General: Skin is warm and dry.  Neurological:     General: No focal deficit present.     Mental Status: She is alert.  ED Results / Procedures / Treatments   Labs (all labs ordered are listed, but only abnormal results are displayed) Labs Reviewed  COMPREHENSIVE METABOLIC PANEL WITH GFR - Abnormal; Notable for the following components:      Result Value   Glucose, Bld 101 (*)    All other components within normal limits  CBC - Abnormal; Notable for the following components:   WBC 15.3 (*)    All other components within normal limits  URINALYSIS, ROUTINE W REFLEX MICROSCOPIC - Abnormal; Notable for the following components:   Color, Urine COLORLESS (*)    Hgb urine dipstick TRACE (*)    Bacteria, UA RARE (*)    All other components within normal limits  LIPASE, BLOOD  PREGNANCY, URINE     EKG None  Radiology No results found.  Procedures Procedures    Medications Ordered in ED Medications  fentaNYL (SUBLIMAZE) injection 50 mcg (50 mcg Intravenous Given 10/04/23 1636)  iohexol (OMNIPAQUE) 300 MG/ML solution 100 mL (100 mLs Intravenous Contrast Given 10/04/23 1729)    ED Course/ Medical Decision Making/ A&P                                 Medical Decision Making Amount and/or Complexity of Data Reviewed Labs: ordered.  Risk Prescription drug management.  This patient is a 43 y.o. female  who presents to the ED for concern of abdominal pain.   Differential diagnoses prior to evaluation: The emergent differential diagnosis includes, but is not limited to,  AAA, mesenteric ischemia, appendicitis, diverticulitis, DKA, gastroenteritis, nephrolithiasis, pancreatitis, constipation, UTI, bowel obstruction, biliary disease, IBD, PUD, hepatitis, ectopic pregnancy, ovarian torsion, PID. This is not an exhaustive differential.   Past Medical History / Co-morbidities / Social History: PCOS, hyperlipidemia, pancreatitis, cholecystectomy  Additional history: Chart reviewed. Pertinent results include: Reviewed prior CT scan in 2023, with small lymph nodes, otherwise unremarkable  Physical Exam: Physical exam performed. The pertinent findings include: Hypertensive, borderline tachycardia.  Appears uncomfortable, standing/pacing in exam room.  Bilateral CVA tenderness, with right upper quadrant tenderness.  Lab Tests/Imaging studies: I personally interpreted labs/imaging and the pertinent results include: BBC 15.3, BMP unremarkable.  Normal lipase.  UA with trace hemoglobin, rare bacteria.  Negative pregnancy.  CT abdomen pelvis pending at time of shift change.   Medications: I ordered medication including fentanyl.  I have reviewed the patients home medicines and have made adjustments as needed.   Disposition: Patient discussed and care transferred to [facility]  at shift change. Please see his/her note for further details regarding further ED course and disposition. Plan at time of handoff is follow up on CT results. Anticipate if normal can dc to home, could trial bentyl. Has scheduled follow up next week with colonoscopy.    Final Clinical Impression(s) / ED Diagnoses Final diagnoses:  Bilateral flank pain  RUQ pain    Rx / DC Orders ED Discharge Orders     None      Portions of this report may have been transcribed using voice recognition software. Every effort was made to ensure accuracy; however, inadvertent computerized transcription errors may be present.    Su Monks, PA-C 10/04/23 1851    Glyn Ade, MD 10/05/23 1500

## 2023-10-04 NOTE — ED Triage Notes (Signed)
 Patient arrives with complaints of worsening back pain and right side abdominal pain that started overnight rates her pain a 7/10.   Scheduled for colonoscopy on within the next week.

## 2023-10-04 NOTE — ED Provider Notes (Signed)
  Physical Exam  BP 133/79   Pulse 82   Temp 98 F (36.7 C)   Resp 16   Ht 5\' 4"  (1.626 m)   Wt (!) 139.3 kg   LMP 09/19/2023 (Approximate)   SpO2 95%   BMI 52.71 kg/m   Physical Exam  Procedures  Procedures  ED Course / MDM   Clinical Course as of 10/04/23 2329  Wed Oct 04, 2023  2328 [facility] hospitalist Dr. [facility] Lavender regarding the patient who agreed with admission and assume further treatment/care. [CR]    Clinical Course User Index [CR] [place] Butter, PA   Medical Decision Making Amount and/or Complexity of Data Reviewed Labs: ordered. Radiology: ordered.  Risk Prescription drug management. Decision regarding hospitalization.   Patient care handed off from PA-C [place] at shift change.  See prior note for more full details.  In short, 43 year old female presents emergency department with complaints of abdominal pain present for the past couple of days.  States she has had history of intermittent upper abdominal pain for the past several months.  Has had multiple outpatient follow-ups with specialist regarding her symptoms.  Reports feelings of nausea with no emesis.  No fever, chills, urinary symptoms, change in bowel habits.  Plan to shift changes to follow CT imaging and reassess.  CT imaging concerning for pancreatitis with concern for pancreatic mass.  Reassessment of the patient showed patient symptoms still not controlled despite multiple rounds of IV narcotic medications.  Will pursue admission.  Consulted hospitalist who agreed with admission.  Treatment plan discussed with patient and she acknowledged understanding was agreeable to said plan.  Patient stable upon admission.    Palmer Butter, [place] 10/04/23 2329    Onetha Bile, MD 10/05/23 1500

## 2023-10-05 ENCOUNTER — Observation Stay (HOSPITAL_COMMUNITY)

## 2023-10-05 DIAGNOSIS — Z6841 Body Mass Index (BMI) 40.0 and over, adult: Secondary | ICD-10-CM | POA: Diagnosis not present

## 2023-10-05 DIAGNOSIS — E785 Hyperlipidemia, unspecified: Secondary | ICD-10-CM | POA: Diagnosis present

## 2023-10-05 DIAGNOSIS — E66813 Obesity, class 3: Secondary | ICD-10-CM | POA: Diagnosis present

## 2023-10-05 DIAGNOSIS — Z8249 Family history of ischemic heart disease and other diseases of the circulatory system: Secondary | ICD-10-CM | POA: Diagnosis not present

## 2023-10-05 DIAGNOSIS — Z87891 Personal history of nicotine dependence: Secondary | ICD-10-CM | POA: Diagnosis not present

## 2023-10-05 DIAGNOSIS — K859 Acute pancreatitis without necrosis or infection, unspecified: Secondary | ICD-10-CM | POA: Diagnosis present

## 2023-10-05 DIAGNOSIS — R160 Hepatomegaly, not elsewhere classified: Secondary | ICD-10-CM | POA: Diagnosis present

## 2023-10-05 DIAGNOSIS — Z79899 Other long term (current) drug therapy: Secondary | ICD-10-CM | POA: Diagnosis not present

## 2023-10-05 LAB — MAGNESIUM: Magnesium: 2.1 mg/dL (ref 1.7–2.4)

## 2023-10-05 LAB — CBC
HCT: 41.8 % (ref 36.0–46.0)
Hemoglobin: 12.9 g/dL (ref 12.0–15.0)
MCH: 27.2 pg (ref 26.0–34.0)
MCHC: 30.9 g/dL (ref 30.0–36.0)
MCV: 88.2 fL (ref 80.0–100.0)
Platelets: 303 10*3/uL (ref 150–400)
RBC: 4.74 MIL/uL (ref 3.87–5.11)
RDW: 13.8 % (ref 11.5–15.5)
WBC: 12.1 10*3/uL — ABNORMAL HIGH (ref 4.0–10.5)
nRBC: 0 % (ref 0.0–0.2)

## 2023-10-05 LAB — BASIC METABOLIC PANEL WITH GFR
Anion gap: 10 (ref 5–15)
BUN: 11 mg/dL (ref 6–20)
CO2: 22 mmol/L (ref 22–32)
Calcium: 8.8 mg/dL — ABNORMAL LOW (ref 8.9–10.3)
Chloride: 103 mmol/L (ref 98–111)
Creatinine, Ser: 0.72 mg/dL (ref 0.44–1.00)
GFR, Estimated: >60 mL/min (ref >60)
Glucose, Bld: 103 mg/dL — ABNORMAL HIGH (ref 70–99)
Potassium: 3.8 mmol/L (ref 3.5–5.1)
Sodium: 135 mmol/L (ref 135–145)

## 2023-10-05 LAB — TRIGLYCERIDES: Triglycerides: 221 mg/dL — ABNORMAL HIGH (ref <150)

## 2023-10-05 LAB — PHOSPHORUS: Phosphorus: 3.9 mg/dL (ref 2.5–4.6)

## 2023-10-05 MED ORDER — ACETAMINOPHEN 325 MG PO TABS
650.0000 mg | ORAL_TABLET | Freq: Four times a day (QID) | ORAL | Status: DC | PRN
  Administered 2023-10-06 – 2023-10-07 (×3): 650 mg via ORAL
  Filled 2023-10-05 (×3): qty 2

## 2023-10-05 MED ORDER — HYDROMORPHONE HCL 1 MG/ML IJ SOLN
1.0000 mg | Freq: Once | INTRAMUSCULAR | Status: AC
  Administered 2023-10-05: 1 mg via INTRAVENOUS
  Filled 2023-10-05: qty 1

## 2023-10-05 MED ORDER — POLYETHYLENE GLYCOL 3350 17 G PO PACK
17.0000 g | PACK | Freq: Every day | ORAL | Status: DC | PRN
Start: 2023-10-05 — End: 2023-10-07

## 2023-10-05 MED ORDER — HYDROMORPHONE HCL 1 MG/ML IJ SOLN
0.5000 mg | INTRAMUSCULAR | Status: DC | PRN
  Administered 2023-10-05 (×3): 0.5 mg via INTRAVENOUS
  Filled 2023-10-05 (×4): qty 0.5

## 2023-10-05 MED ORDER — MELATONIN 5 MG PO TABS
5.0000 mg | ORAL_TABLET | Freq: Every evening | ORAL | Status: DC | PRN
  Administered 2023-10-05 – 2023-10-06 (×2): 5 mg via ORAL
  Filled 2023-10-05 (×2): qty 1

## 2023-10-05 MED ORDER — PROCHLORPERAZINE EDISYLATE 10 MG/2ML IJ SOLN
5.0000 mg | Freq: Four times a day (QID) | INTRAMUSCULAR | Status: DC | PRN
  Administered 2023-10-05 – 2023-10-06 (×2): 5 mg via INTRAVENOUS
  Filled 2023-10-05 (×2): qty 2

## 2023-10-05 MED ORDER — GADOBUTROL 1 MMOL/ML IV SOLN
10.0000 mL | Freq: Once | INTRAVENOUS | Status: AC | PRN
  Administered 2023-10-05: 10 mL via INTRAVENOUS

## 2023-10-05 MED ORDER — ENOXAPARIN SODIUM 40 MG/0.4ML IJ SOSY
40.0000 mg | PREFILLED_SYRINGE | INTRAMUSCULAR | Status: DC
  Administered 2023-10-05 – 2023-10-06 (×2): 40 mg via SUBCUTANEOUS
  Filled 2023-10-05 (×2): qty 0.4

## 2023-10-05 MED ORDER — SODIUM CHLORIDE 0.9 % IV SOLN: INTRAVENOUS | Status: DC

## 2023-10-05 MED ORDER — LORAZEPAM 2 MG/ML IJ SOLN
2.0000 mg | Freq: Every day | INTRAMUSCULAR | Status: DC | PRN
Start: 2023-10-05 — End: 2023-10-07
  Administered 2023-10-05: 2 mg via INTRAVENOUS
  Filled 2023-10-05: qty 1

## 2023-10-05 NOTE — Progress Notes (Addendum)
 [facility] Progress Note  Patient: Alexis Howell    ZOX:[id number]  DOA: 10/04/2023     Date of Service: the patient was seen and examined on 10/05/2023  Chief Complaint  Patient presents with   Abdominal Pain   Back Pain   Brief hospital course: Alexis Howell is a 43 y.o. female with medical history significant for severe morbid obesity, external hemorrhoids, causing occasional bleeding, history of pancreatitis, post cholecystectomy due to gallstones in 2017, who initially presented to [facility] with complaints of right upper quadrant abdominal pain radiating to her lower back across her flank bilaterally.  Onset of symptoms about 2 days ago.  No reported subjective fever, chills, nausea, vomiting, or diarrhea.   In the ER, the patient required several rounds of IV opioid-based analgesics without significant improvement of her symptomatology.  Contrasted CT abdomen pelvis showed acute pancreatitis, underlying developing mass of the uncinate process not excluded.  The patient received IV fluid hydration.  [facility], hospitalist service was asked to admit.  Accepted by Dr. Toniann Fail and transferred to [facility] MedSurg unit as observation status.   Due to concern for pancreatic mass MRI pancreatic head was obtained and is pending.   ED Course: Temperature 98.  BP 113/68, pulse 73, respiration rate 16, O2 saturation 99% on room air.  Lab studies notable for WBC 15.3.    Assessment and Plan: Present on Admission:  Acute pancreatitis   Principal Problem:   Acute pancreatitis   Acute pancreatitis, seen on CT scan Lipase 36 wnl, triglycerides level 221 slightly elevated Post cholecystectomy in 2017.  No biliary dilatation seen on CT scan. Concern for possible pancreatic mass MRI of pancreas: Uncinate process acute pancreatitis, no significant fluid collection.  Mass cannot be ruled out, recommended endoscopic ultrasound.  No pseudocyst or abscess.  Hepatomegaly  with steatosis.  No biliary calculi Continue pain control Continue IV fluid 4/17 as per patient she is scheduled for colonoscopy on Monday, so she was wondering if it can be done today.  Discussed with GI, recommended no acute need of colonoscopy, it can be done as an outpatient as scheduled. Feel free to consult GI if needed after MRCP Continue clear liquid diet, advance gradually as per tolerance  Addendum:  GI recommended CA 19-9, repeat imaging after 4 weeks, may not need EUS.  Need to reschedule colonoscopy as an outpatient, may not be able to get it done by Monday.  Advance diet to full liquid tomorrow and then [facility] will not do official consult unless it is needed, can be consulted.     Right upper quadrant abdominal pain Status post cholecystectomy in 2017 LFTs are within normal range Continue supportive care   Severe morbid obesity BMI 52 Recommend weight loss outpatient with regular physical activity and healthy dieting   Body mass index is 52.71 kg/m.  Interventions:  Diet: CLD DVT Prophylaxis: Subcutaneous Lovenox   Advance goals of care discussion: Full code  Family Communication: family was not present at bedside, at the time of interview.  The pt provided permission to discuss medical plan with the family. Opportunity was given to ask question and all questions were answered satisfactorily.   Disposition:  Pt is from home, admitted with acute pancreatitis, still has abdominal pain, on clear liquid diet and IV fluid, which precludes a safe discharge. Discharge to home, when stable, may need few days to improve.  Subjective: No significant events overnight, patient still complaining of abdominal fullness, pain is 3/10  feels improvement, still does not feel hungry, would like to continue clear liquid diet. Denied any chest pain or palpitations, no any other complaints  Physical Exam: General: NAD, lying comfortably Appear in no distress, affect  appropriate Eyes: PERRLA ENT: Oral Mucosa Clear, moist  Neck: no JVD,  Cardiovascular: S1 and S2 Present, no Murmur,  Respiratory: good respiratory effort, Bilateral Air entry equal and Decreased, no Crackles, no wheezes Abdomen: Bowel Sound present, Soft, obese and mild epigastric tenderness,  Skin: no rashes Extremities: no Pedal edema, no calf tenderness Neurologic: without any new focal findings Gait not checked due to patient safety concerns  Vitals:   10/05/23 0023 10/05/23 0029 10/05/23 0139 10/05/23 0504  BP: 133/89  131/89 113/68  Pulse: 75  80 73  Resp: 15  16 16   Temp:  98.4 F (36.9 C) 98.2 F (36.8 C) 98 F (36.7 C)  TempSrc:  Oral    SpO2: 100%  97% 99%  Weight:      Height:        Intake/Output Summary (Last 24 hours) at 10/05/2023 1524 Last data filed at 10/05/2023 0630 Gross per 24 hour  Intake 1015.33 ml  Output --  Net 1015.33 ml   Filed Weights   10/04/23 1432  Weight: (!) 139.3 kg    Data Reviewed: I have personally reviewed and interpreted daily labs, tele strips, imagings as discussed above. I reviewed all nursing notes, pharmacy notes, vitals, pertinent old records I have discussed plan of care as described above with RN and patient/family.  CBC: Recent Labs  Lab 10/04/23 1632 10/05/23 0610  WBC 15.3* 12.1*  HGB 13.8 12.9  HCT 42.1 41.8  MCV 82.5 88.2  PLT 348 303   Basic Metabolic Panel: Recent Labs  Lab 10/04/23 1632 10/05/23 0610  NA 135 135  K 4.2 3.8  CL 100 103  CO2 25 22  GLUCOSE 101* 103*  BUN 15 11  CREATININE 0.82 0.72  CALCIUM 9.6 8.8*  MG  --  2.1  PHOS  --  3.9    Studies: MR ABDOMEN MRCP W WO CONTAST Result Date: 10/05/2023 CLINICAL DATA:  Pancreatitis with possible developing uncinate process lesion. EXAM: MRI ABDOMEN WITHOUT AND WITH CONTRAST (INCLUDING MRCP) TECHNIQUE: Multiplanar multisequence MR imaging of the abdomen was performed both before and after the administration of intravenous contrast.  Heavily T2-weighted images of the biliary and pancreatic ducts were obtained, and three-dimensional MRCP images were rendered by post processing. CONTRAST:  10mL GADAVIST GADOBUTROL 1 MMOL/ML IV SOLN COMPARISON:  CT scan 10/04/2023 FINDINGS: Lower chest: No acute findings. Hepatobiliary: Liver measures 21.3 cm craniocaudal length. Loss of signal intensity within the liver parenchyma diffusely on out of phase T1 imaging is consistent with fatty deposition. Although postcontrast imaging of the liver is motion degraded, no focal hepatic mass lesion evident. Gallbladder surgically absent. No intrahepatic or extrahepatic biliary dilation. Extrahepatic common bile duct measures 5 mm diameter in the head of the pancreas. MRCP imaging shows no evidence of choledocholithiasis. Pancreas: No main duct dilatation. There is edema in and around the head of the pancreas extending into the pancreatico duodenal groove and along the descending and proximal transverse duodenum. Confluent smudgy T2 hyperintensity is identified in the uncinate process as noted on recent CT. This region shows subtle hyper enhancement relative to background pancreatic parenchyma without evidence for restricted diffusion. Spleen:  No splenomegaly. No suspicious focal mass lesion. Adrenals/Urinary Tract: No adrenal nodule or mass. Kidneys unremarkable. Stomach/Bowel: Stomach is unremarkable. No gastric  wall thickening. No evidence of outlet obstruction. Duodenum is normally positioned as is the ligament of Treitz. No small bowel or colonic dilatation within the visualized abdomen. Vascular/Lymphatic: No abdominal aortic aneurysm. The portal vein and superior mesenteric vein are patent. Splenic vein is patent. Upper normal lymph nodes are seen in the hepatoduodenal ligament. Other:  No substantial intraperitoneal free fluid. Musculoskeletal: No focal suspicious marrow enhancement within the visualized bony anatomy. IMPRESSION: 1. Edema in and around the head  of the pancreas extending into the pancreatico duodenal groove and along the descending and proximal transverse duodenum. Confluent smudgy T2 hyperintensity is identified in the uncinate process creating a "masslike" appearance similar to noted on recent CT. This region shows subtle hyper enhancement relative to background pancreatic parenchyma without evidence for restricted diffusion. Findings are likely related to sequelae of acute pancreatitis although underlying mass lesion is not excluded and continued close follow-up recommended. Endoscopic ultrasound may prove helpful to further evaluate. 2. No evidence for pseudocyst or abscess. 3. Hepatomegaly with hepatic steatosis. 4. Status post cholecystectomy. No intrahepatic or extrahepatic biliary dilation. MRCP imaging shows no evidence of choledocholithiasis. Electronically Signed   By: Donnal Fusi M.D.   On: 10/05/2023 12:34   MR 3D Recon At Scanner Result Date: 10/05/2023 CLINICAL DATA:  Pancreatitis with possible developing uncinate process lesion. EXAM: MRI ABDOMEN WITHOUT AND WITH CONTRAST (INCLUDING MRCP) TECHNIQUE: Multiplanar multisequence MR imaging of the abdomen was performed both before and after the administration of intravenous contrast. Heavily T2-weighted images of the biliary and pancreatic ducts were obtained, and three-dimensional MRCP images were rendered by post processing. CONTRAST:  10mL GADAVIST GADOBUTROL 1 MMOL/ML IV SOLN COMPARISON:  CT scan 10/04/2023 FINDINGS: Lower chest: No acute findings. Hepatobiliary: Liver measures 21.3 cm craniocaudal length. Loss of signal intensity within the liver parenchyma diffusely on out of phase T1 imaging is consistent with fatty deposition. Although postcontrast imaging of the liver is motion degraded, no focal hepatic mass lesion evident. Gallbladder surgically absent. No intrahepatic or extrahepatic biliary dilation. Extrahepatic common bile duct measures 5 mm diameter in the head of the  pancreas. MRCP imaging shows no evidence of choledocholithiasis. Pancreas: No main duct dilatation. There is edema in and around the head of the pancreas extending into the pancreatico duodenal groove and along the descending and proximal transverse duodenum. Confluent smudgy T2 hyperintensity is identified in the uncinate process as noted on recent CT. This region shows subtle hyper enhancement relative to background pancreatic parenchyma without evidence for restricted diffusion. Spleen:  No splenomegaly. No suspicious focal mass lesion. Adrenals/Urinary Tract: No adrenal nodule or mass. Kidneys unremarkable. Stomach/Bowel: Stomach is unremarkable. No gastric wall thickening. No evidence of outlet obstruction. Duodenum is normally positioned as is the ligament of Treitz. No small bowel or colonic dilatation within the visualized abdomen. Vascular/Lymphatic: No abdominal aortic aneurysm. The portal vein and superior mesenteric vein are patent. Splenic vein is patent. Upper normal lymph nodes are seen in the hepatoduodenal ligament. Other:  No substantial intraperitoneal free fluid. Musculoskeletal: No focal suspicious marrow enhancement within the visualized bony anatomy. IMPRESSION: 1. Edema in and around the head of the pancreas extending into the pancreatico duodenal groove and along the descending and proximal transverse duodenum. Confluent smudgy T2 hyperintensity is identified in the uncinate process creating a "masslike" appearance similar to noted on recent CT. This region shows subtle hyper enhancement relative to background pancreatic parenchyma without evidence for restricted diffusion. Findings are likely related to sequelae of acute pancreatitis although underlying mass lesion  is not excluded and continued close follow-up recommended. Endoscopic ultrasound may prove helpful to further evaluate. 2. No evidence for pseudocyst or abscess. 3. Hepatomegaly with hepatic steatosis. 4. Status post  cholecystectomy. No intrahepatic or extrahepatic biliary dilation. MRCP imaging shows no evidence of choledocholithiasis. Electronically Signed   By: Donnal Fusi M.D.   On: 10/05/2023 12:34   CT ABDOMEN PELVIS W CONTRAST Result Date: 10/04/2023 CLINICAL DATA:  Abdominal pain, acute, nonlocalized Abdominal/flank pain, stone suspected EXAM: CT ABDOMEN AND PELVIS WITH CONTRAST TECHNIQUE: Multidetector CT imaging of the abdomen and pelvis was performed using the standard protocol following bolus administration of intravenous contrast. RADIATION DOSE REDUCTION: This exam was performed according to the departmental dose-optimization program which includes automated exposure control, adjustment of the mA and/or kV according to patient size and/or use of iterative reconstruction technique. CONTRAST:  OMNIPAQUE IOHEXOL 300 MG/ML  SOLN COMPARISON:  CT abdomen pelvis 07/23/2021, CT abdomen pelvis 05/09/2023 FINDINGS: Lower chest: No acute abnormality. Hepatobiliary: No focal liver abnormality. Status post cholecystectomy. No biliary dilatation. Pancreas: Mildly atrophic. Hazy contour of the uncinate process with slight masslike appearance. Associated mild fat stranding along the uncinate process. No main pancreatic ductal dilatation. No pseudocyst formation. Spleen: Normal in size without focal abnormality. Adrenals/Urinary Tract: No adrenal nodule bilaterally. Bilateral kidneys enhance symmetrically. No hydronephrosis. No hydroureter. The urinary bladder is unremarkable. Stomach/Bowel: Stomach is within normal limits. No evidence of bowel wall thickening or dilatation. Colonic diverticulosis. Appendix appears normal. Vascular/Lymphatic: No abdominal aorta or iliac aneurysm. Mild atherosclerotic plaque of the aorta and its branches. Persistent prominent but nonenlarged retroperitoneal and mesenteric lymph nodes. No abdominal, pelvic, or inguinal lymphadenopathy. Reproductive: Uterus and bilateral adnexa are  unremarkable. Other: No intraperitoneal free fluid. No intraperitoneal free gas. No organized fluid collection. Musculoskeletal: No abdominal wall hernia or abnormality. No suspicious lytic or blastic osseous lesions. No acute displaced fracture. IMPRESSION: 1. Acute pancreatitis. Underlying developing mass of the uncinate process not excluded. When the patient is clinically stable and able to follow directions and hold their breath (preferably as an outpatient) further evaluation with dedicated MRI pancreatic protocol should be considered (after acute pancreatitis resolution). 2. Status post cholecystectomy. Electronically Signed   By: Morgane  Naveau M.D.   On: 10/04/2023 20:23    Scheduled Meds:  enoxaparin (LOVENOX) injection  40 mg Subcutaneous Q24H   Continuous Infusions:  lactated ringers 125 mL/hr at 10/04/23 2315   PRN Meds: acetaminophen, HYDROmorphone (DILAUDID) injection, LORazepam, melatonin, polyethylene glycol, prochlorperazine  Time spent: 55 minutes  Author: Althia Atlas. MD [facility] 10/05/2023 3:24 PM  To reach On-call, see care teams to locate the attending and reach out to them via [email or web address]. If 7PM-7AM, please contact night-coverage If you still have difficulty reaching the attending provider, please page the [facility] (Director on Call) for [facility] on amion for assistance.

## 2023-10-05 NOTE — H&P (Addendum)
 History and Physical  Alexis Howell WUJ:[id number] DOB: Dec 15, 1980 DOA: 10/04/2023  Referring physician: Accepted by Dr. Toniann Fail, [facility], hospitalist service. PCP: Dois Davenport, MD  Outpatient Specialists: GI. Patient coming from: Home through [facility] ED.  Chief Complaint: Right upper quadrant pain  HPI: Alexis Howell is a 43 y.o. female with medical history significant for severe morbid obesity, external hemorrhoids, causing occasional bleeding, history of pancreatitis, post cholecystectomy due to gallstones in 2017, who initially presented to [facility] ED with complaints of right upper quadrant abdominal pain radiating to her lower back across her flank bilaterally.  Onset of symptoms about 2 days ago.  No reported subjective fever, chills, nausea, vomiting, or diarrhea.  In the ER, the patient required several rounds of IV opioid-based analgesics without significant improvement of her symptomatology.  Contrasted CT abdomen pelvis showed acute pancreatitis, underlying developing mass of the uncinate process not excluded.  The patient received IV fluid hydration.  [facility], hospitalist service was asked to admit.  Accepted by Dr. Toniann Fail and transferred to [facility] MedSurg unit as observation status.  Due to concern for pancreatic mass MRI pancreatic head was obtained and is pending.  ED Course: Temperature 98.  BP 113/68, pulse 73, respiration rate 16, O2 saturation 99% on room air.  Lab studies notable for WBC 15.3.  Review of Systems: Review of systems as noted in the HPI. All other systems reviewed and are negative.   Past Medical History:  Diagnosis Date   Delivery of pregnancy by cesarean section: Indication: arrest of dilation 11/26/2016   Hyperlipemia    Obesity    Pancreatitis    one episode   PCOS (polycystic ovarian syndrome)    Pneumonia    in her 20's   Prolonged first stage (of labor) 11/26/2016   Past Surgical History:  Procedure  Laterality Date   BIOPSY  09/09/2021   Procedure: BIOPSY;  Surgeon: Rachael Fee, MD;  Location: [facility];  Service: Endoscopy;;   CESAREAN SECTION N/A 11/26/2016   Procedure: CESAREAN SECTION;  Surgeon: Noland Fordyce, MD;  Location: [facility]IRTHING SUITES;  Service: Obstetrics;  Laterality: N/A;   CHOLECYSTECTOMY N/A 08/12/2014   Procedure: LAPAROSCOPIC CHOLECYSTECTOMY WITH INTRAOPERATIVE CHOLANGIOGRAM;  Surgeon: Atilano Ina, MD;  Location: [facility] ORS;  Service: General;  Laterality: N/A;   ESOPHAGOGASTRODUODENOSCOPY (EGD) WITH PROPOFOL N/A 09/09/2021   Procedure: ESOPHAGOGASTRODUODENOSCOPY (EGD) WITH PROPOFOL;  Surgeon: Rachael Fee, MD;  Location: [facility] ENDOSCOPY;  Service: Endoscopy;  Laterality: N/A;   TONSILLECTOMY AND ADENOIDECTOMY     WISDOM TOOTH EXTRACTION      Social History:  reports that she quit smoking about 14 years ago. Her smoking use included cigarettes. She has never used smokeless tobacco. She reports current alcohol use. She reports that she does not use drugs.   No Known Allergies  Family History  Problem Relation Age of Onset   Heart disease Mother    Diverticulitis Mother    Seizures Sister    Diverticulitis Sister    Mental illness Paternal Grandmother       Prior to Admission medications   Medication Sig Start Date End Date Taking? Authorizing Provider  ASHWAGANDHA PO Take 1,000 mg by mouth daily. Patient not taking: Reported on 07/28/2023    [provider]  Barberry-Oreg Grape-Goldenseal (BERBERINE COMPLEX PO) Take 200 mg by mouth daily. Turmeric    [provider]  hyoscyamine (LEVSIN SL) 0.125 MG SL tablet Place 1 tablet (0.125 mg total) under the tongue every 4 (  four) hours as needed. 08/11/23   McMichael, Elba Greathouse, PA-C  Red Yeast Rice Extract (RED YEAST RICE PO) Take 1,215 mg by mouth daily. Co Q 10    [provider]  spironolactone (ALDACTONE) 50 MG tablet Take 50 mg by mouth daily. Patient not taking: Reported on  07/28/2023    [provider]    Physical Exam: BP 131/89 (BP Location: Right Arm)   Pulse 80   Temp 98.2 F (36.8 C)   Resp 16   Ht 5\' 4"  (1.626 m)   Wt (!) 139.3 kg   LMP 09/19/2023 (Approximate)   SpO2 97%   BMI 52.71 kg/m   General: 43 y.o. year-old female well developed well nourished in no acute distress.  Alert and oriented x3. Cardiovascular: Regular rate and rhythm with no rubs or gallops.  No thyromegaly or JVD noted.  No lower extremity edema. 2/4 pulses in all 4 extremities. Respiratory: Clear to auscultation with no wheezes or rales. Good inspiratory effort. Abdomen: Obese right upper quadrant abdominal tenderness with normal bowel sounds x4 quadrants. Muskuloskeletal: No cyanosis, clubbing or edema noted bilaterally Neuro: CN II-XII intact, strength, sensation, reflexes Skin: No ulcerative lesions noted or rashes Psychiatry: Judgement and insight appear normal. Mood is appropriate for condition and setting          Labs on Admission:  Basic Metabolic Panel: Recent Labs  Lab 10/04/23 1632  NA 135  K 4.2  CL 100  CO2 25  GLUCOSE 101*  BUN 15  CREATININE 0.82  CALCIUM 9.6   Liver Function Tests: Recent Labs  Lab 10/04/23 1632  AST 19  ALT 19  ALKPHOS 64  BILITOT 0.3  PROT 8.0  ALBUMIN 4.5   Recent Labs  Lab 10/04/23 1632  LIPASE 36   No results for input(s): "AMMONIA" in the last 168 hours. CBC: Recent Labs  Lab 10/04/23 1632  WBC 15.3*  HGB 13.8  HCT 42.1  MCV 82.5  PLT 348   Cardiac Enzymes: No results for input(s): "CKTOTAL", "CKMB", "CKMBINDEX", "TROPONINI" in the last 168 hours.  BNP (last 3 results) No results for input(s): "BNP" in the last 8760 hours.  ProBNP (last 3 results) No results for input(s): "PROBNP" in the last 8760 hours.  CBG: No results for input(s): "GLUCAP" in the last 168 hours.  Radiological Exams on Admission: CT ABDOMEN PELVIS W CONTRAST Result Date: 10/04/2023 CLINICAL DATA:  Abdominal  pain, acute, nonlocalized Abdominal/flank pain, stone suspected EXAM: CT ABDOMEN AND PELVIS WITH CONTRAST TECHNIQUE: Multidetector CT imaging of the abdomen and pelvis was performed using the standard protocol following bolus administration of intravenous contrast. RADIATION DOSE REDUCTION: This exam was performed according to the departmental dose-optimization program which includes automated exposure control, adjustment of the mA and/or kV according to patient size and/or use of iterative reconstruction technique. CONTRAST:  OMNIPAQUE IOHEXOL 300 MG/ML  SOLN COMPARISON:  CT abdomen pelvis 07/23/2021, CT abdomen pelvis 05/09/2023 FINDINGS: Lower chest: No acute abnormality. Hepatobiliary: No focal liver abnormality. Status post cholecystectomy. No biliary dilatation. Pancreas: Mildly atrophic. Hazy contour of the uncinate process with slight masslike appearance. Associated mild fat stranding along the uncinate process. No main pancreatic ductal dilatation. No pseudocyst formation. Spleen: Normal in size without focal abnormality. Adrenals/Urinary Tract: No adrenal nodule bilaterally. Bilateral kidneys enhance symmetrically. No hydronephrosis. No hydroureter. The urinary bladder is unremarkable. Stomach/Bowel: Stomach is within normal limits. No evidence of bowel wall thickening or dilatation. Colonic diverticulosis. Appendix appears normal. Vascular/Lymphatic: No abdominal aorta  or iliac aneurysm. Mild atherosclerotic plaque of the aorta and its branches. Persistent prominent but nonenlarged retroperitoneal and mesenteric lymph nodes. No abdominal, pelvic, or inguinal lymphadenopathy. Reproductive: Uterus and bilateral adnexa are unremarkable. Other: No intraperitoneal free fluid. No intraperitoneal free gas. No organized fluid collection. Musculoskeletal: No abdominal wall hernia or abnormality. No suspicious lytic or blastic osseous lesions. No acute displaced fracture. IMPRESSION: 1. Acute pancreatitis.  Underlying developing mass of the uncinate process not excluded. When the patient is clinically stable and able to follow directions and hold their breath (preferably as an outpatient) further evaluation with dedicated MRI pancreatic protocol should be considered (after acute pancreatitis resolution). 2. Status post cholecystectomy. Electronically Signed   By: Morgane  Naveau M.D.   On: 10/04/2023 20:23    EKG: I independently viewed the EKG done and my findings are as followed: None available at the time of this visit.  Assessment/Plan Present on Admission:  Acute pancreatitis  Principal Problem:   Acute pancreatitis  Acute pancreatitis, seen on CT scan Lipase 36, triglycerides level in process Post cholecystectomy in 2017.  No biliary dilatation seen on CT scan. Concern for possible pancreatic mass Follow MRI of pancreas Continue pain control Continue IV fluid  Right upper quadrant abdominal pain Status post cholecystectomy in 2017 LFTs are within normal range Continue supportive care  Severe morbid obesity BMI 52 Recommend weight loss outpatient with regular physical activity and healthy dieting   Time: 75 minutes.   DVT prophylaxis: Subcu Lovenox daily  Code Status: Full code.  Family Communication: Husband at bedside.  Disposition Plan: Admitted to MedSurg unit  Consults called: None.  Admission status: Observation status.   Status is: Observation    Bary Boss MD [facility] Pager [phone]  If 7PM-7AM, please contact night-coverage [email or web address] Password [email or web address]  10/05/2023, 3:24 AM

## 2023-10-05 NOTE — Plan of Care (Signed)

## 2023-10-05 NOTE — Progress Notes (Signed)
   10/05/23 1015  TOC Brief Assessment  Insurance and Status Reviewed  Patient has primary care physician Yes  Home environment has been reviewed Home with spouse  Prior level of function: Independent  Prior/Current Home Services No current home services  Social Drivers of Health Review SDOH reviewed no interventions necessary  Readmission risk has been reviewed Yes (N/A)  Transition of care needs no transition of care needs at this time

## 2023-10-06 DIAGNOSIS — K859 Acute pancreatitis without necrosis or infection, unspecified: Secondary | ICD-10-CM | POA: Diagnosis not present

## 2023-10-06 LAB — BASIC METABOLIC PANEL WITH GFR
Anion gap: 10 (ref 5–15)
BUN: 9 mg/dL (ref 6–20)
CO2: 23 mmol/L (ref 22–32)
Calcium: 8.6 mg/dL — ABNORMAL LOW (ref 8.9–10.3)
Chloride: 103 mmol/L (ref 98–111)
Creatinine, Ser: 0.68 mg/dL (ref 0.44–1.00)
GFR, Estimated: >60 mL/min (ref >60)
Glucose, Bld: 94 mg/dL (ref 70–99)
Potassium: 3.6 mmol/L (ref 3.5–5.1)
Sodium: 136 mmol/L (ref 135–145)

## 2023-10-06 LAB — HEPATIC FUNCTION PANEL
ALT: 72 U/L — ABNORMAL HIGH (ref 0–44)
AST: 46 U/L — ABNORMAL HIGH (ref 15–41)
Albumin: 3.6 g/dL (ref 3.5–5.0)
Alkaline Phosphatase: 65 U/L (ref 38–126)
Bilirubin, Direct: <0.1 mg/dL (ref 0.0–0.2)
Total Bilirubin: 0.8 mg/dL (ref 0.0–1.2)
Total Protein: 6.8 g/dL (ref 6.5–8.1)

## 2023-10-06 LAB — LIPASE, BLOOD: Lipase: 26 U/L (ref 11–51)

## 2023-10-06 LAB — CBC
HCT: 38.3 % (ref 36.0–46.0)
Hemoglobin: 12.1 g/dL (ref 12.0–15.0)
MCH: 27.4 pg (ref 26.0–34.0)
MCHC: 31.6 g/dL (ref 30.0–36.0)
MCV: 86.7 fL (ref 80.0–100.0)
Platelets: 289 10*3/uL (ref 150–400)
RBC: 4.42 MIL/uL (ref 3.87–5.11)
RDW: 13.9 % (ref 11.5–15.5)
WBC: 7.8 10*3/uL (ref 4.0–10.5)
nRBC: 0 % (ref 0.0–0.2)

## 2023-10-06 LAB — PHOSPHORUS: Phosphorus: 3.2 mg/dL (ref 2.5–4.6)

## 2023-10-06 LAB — CANCER ANTIGEN 19-9: CA 19-9: 11 U/mL (ref 0–35)

## 2023-10-06 LAB — AMYLASE: Amylase: 48 U/L (ref 28–100)

## 2023-10-06 LAB — MAGNESIUM: Magnesium: 2.3 mg/dL (ref 1.7–2.4)

## 2023-10-06 LAB — HIV ANTIBODY (ROUTINE TESTING W REFLEX): HIV Screen 4th Generation wRfx: NONREACTIVE

## 2023-10-06 MED ORDER — OXYCODONE HCL 5 MG PO TABS
5.0000 mg | ORAL_TABLET | Freq: Four times a day (QID) | ORAL | Status: DC | PRN
  Administered 2023-10-06 (×2): 5 mg via ORAL
  Filled 2023-10-06 (×2): qty 1

## 2023-10-06 NOTE — Plan of Care (Signed)

## 2023-10-06 NOTE — Plan of Care (Signed)
 Pt states she is doing better this shift and would like to advance diet. Problem: Education: Goal: Knowledge of General Education information will improve Description: Including pain rating scale, medication(s)/side effects and non-pharmacologic comfort measures Outcome: Progressing   Problem: Health Behavior/Discharge Planning: Goal: Ability to manage health-related needs will improve Outcome: Progressing   Problem: Clinical Measurements: Goal: Ability to maintain clinical measurements within normal limits will improve Outcome: Progressing Goal: Will remain free from infection Outcome: Progressing Goal: Diagnostic test results will improve Outcome: Progressing Goal: Respiratory complications will improve Outcome: Progressing Goal: Cardiovascular complication will be avoided Outcome: Progressing   Problem: Activity: Goal: Risk for activity intolerance will decrease Outcome: Progressing   Problem: Nutrition: Goal: Adequate nutrition will be maintained Outcome: Progressing   Problem: Coping: Goal: Level of anxiety will decrease Outcome: Progressing   Problem: Elimination: Goal: Will not experience complications related to bowel motility Outcome: Progressing Goal: Will not experience complications related to urinary retention Outcome: Progressing   Problem: Pain Managment: Goal: General experience of comfort will improve and/or be controlled Outcome: Progressing   Problem: Safety: Goal: Ability to remain free from injury will improve Outcome: Progressing   Problem: Skin Integrity: Goal: Risk for impaired skin integrity will decrease Outcome: Progressing

## 2023-10-06 NOTE — Progress Notes (Signed)
 Progress Note   Patient: Alexis Howell  FMW:[id number] DOB: 1980-06-25 DOA: 10/04/2023     1 DOS: the patient was seen and examined on 10/06/2023   Brief hospital course: 43 y o woman with PMH of  external hemorrhoids, causing occasional bleeding, history of pancreatitis, post cholecystectomy due to gallstones in 2017, Class III obesity who initially presented to [facility] ED with complaints of right upper quadrant abdominal pain radiating to her lower back across her flank bilaterally.   Contrasted CT abdomen pelvis showed acute pancreatitis, underlying developing mass of the uncinate process not excluded.  The patient received IV fluid hydration.  TR[facility], hospitalist service was asked to admit.  Accepted by Dr. Franky and transferred to [facility] MedSurg unit.   Due to concern for pancreatic mass MRI pancreatic head was obtained.  Assessment and Plan:  Acute pancreatitis, seen on CT scan Lipase 36 wnl, triglycerides level 221 slightly elevated Post cholecystectomy in 2017.  No biliary dilatation seen on CT scan. Concern for possible pancreatic mass MRI of pancreas: Uncinate process acute pancreatitis, no significant fluid collection.  Mass cannot be ruled out, recommended endoscopic ultrasound.  No pseudocyst or abscess.  Hepatomegaly with steatosis.  No biliary calculi CA 19-9 11 - Continue pain control - Continue to advance diet as tolerated.  - Per patient she is scheduled for colonoscopy on Monday, so she was wondering if it can be done today.  Discussed with GI, recommended no acute need of colonoscopy, it can be done as an outpatient as scheduled. - Plan to refer patient for outpatient EUS after acute episode of pancreatitis as evaluation will be more conclusive when inflammatory process resolves.  - Will order repeat MRCP in 4 weeks per GI recs.   - Likely discharge home in AM if no longer requiring IV pain meds, and tolerating FLD.     Right upper quadrant  abdominal pain Status post cholecystectomy in 2017 LFTs are within normal range Continue supportive care   Class III Obesity BMI 52 Recommend weight loss outpatient with regular physical activity and healthy dieting      Subjective: Patient is feeling much better. She still has RUQ pain. She is tolerating a clear liquid diet.   Physical Exam: Vitals:   10/05/23 0504 10/05/23 1721 10/06/23 0432 10/06/23 1227  BP: 113/68 125/86 (!) 122/57 (!) 143/87  Pulse: 73 85 84 86  Resp: 16 18 18 16   Temp: 98 F (36.7 C) 98 F (36.7 C) 98.1 F (36.7 C) 98.7 F (37.1 C)  TempSrc:    Oral  SpO2: 99% 100% 99% 98%  Weight:      Height:        General: Alert, oriented X3  Eyes: Pupils equal, reactive  Oral cavity: moist mucous membranes  Head: Atraumatic, normocephalic  Neck: supple  Chest: clear to auscultation. No crackles, no wheezes  CVS: S1,S2 RRR. No murmurs  Abd: No distention, soft, non-tender. No masses palpable  Extr: No edema   MSK: No joint deformities or swelling  Neurological: Grossly intact.     Data Reviewed:      Latest Ref Rng & Units 10/06/2023    5:24 AM 10/05/2023    6:10 AM 10/04/2023    4:32 PM  CBC  WBC 4.0 - 10.5 K/uL 7.8  12.1  15.3   Hemoglobin 12.0 - 15.0 g/dL 87.8  87.0  86.1   Hematocrit 36.0 - 46.0 % 38.3  41.8  42.1   Platelets 150 - 400 K/uL  289  303  348       Latest Ref Rng & Units 10/06/2023    5:24 AM 10/05/2023    6:10 AM 10/04/2023    4:32 PM  BMP  Glucose 70 - 99 mg/dL 94  896  898   BUN 6 - 20 mg/dL 9  11  15    Creatinine 0.44 - 1.00 mg/dL 9.31  9.27  9.17   Sodium 135 - 145 mmol/L 136  135  135   Potassium 3.5 - 5.1 mmol/L 3.6  3.8  4.2   Chloride 98 - 111 mmol/L 103  103  100   CO2 22 - 32 mmol/L 23  22  25    Calcium 8.9 - 10.3 mg/dL 8.6  8.8  9.6      Family Communication: n/a  Disposition: Status is: Inpatient   Planned Discharge Destination: Home    Time spent: 40 minutes  Author: MDALA-GAUSI, Teagen Bucio AGATHA,  MD 10/06/2023 1:50 PM  For on call review [email or web address].

## 2023-10-07 DIAGNOSIS — K859 Acute pancreatitis without necrosis or infection, unspecified: Secondary | ICD-10-CM | POA: Diagnosis not present

## 2023-10-07 LAB — COMPREHENSIVE METABOLIC PANEL WITH GFR
ALT: 90 U/L — ABNORMAL HIGH (ref 0–44)
AST: 70 U/L — ABNORMAL HIGH (ref 15–41)
Albumin: 3.5 g/dL (ref 3.5–5.0)
Alkaline Phosphatase: 67 U/L (ref 38–126)
Anion gap: 8 (ref 5–15)
BUN: 10 mg/dL (ref 6–20)
CO2: 27 mmol/L (ref 22–32)
Calcium: 8.8 mg/dL — ABNORMAL LOW (ref 8.9–10.3)
Chloride: 104 mmol/L (ref 98–111)
Creatinine, Ser: 0.79 mg/dL (ref 0.44–1.00)
GFR, Estimated: >60 mL/min (ref >60)
Glucose, Bld: 96 mg/dL (ref 70–99)
Potassium: 4.4 mmol/L (ref 3.5–5.1)
Sodium: 139 mmol/L (ref 135–145)
Total Bilirubin: 0.6 mg/dL (ref 0.0–1.2)
Total Protein: 6.9 g/dL (ref 6.5–8.1)

## 2023-10-07 MED ORDER — ONDANSETRON 4 MG PO TBDP: 4.0000 mg | ORAL_TABLET | Freq: Three times a day (TID) | ORAL | 0 refills | Status: DC | PRN

## 2023-10-07 MED ORDER — OXYCODONE HCL 5 MG PO TABS: 5.0000 mg | ORAL_TABLET | Freq: Four times a day (QID) | ORAL | 0 refills | Status: DC | PRN

## 2023-10-07 NOTE — Discharge Summary (Signed)
 Physician Discharge Summary   Patient: Alexis Howell  MRN: [id number] DOB: 03-06-1981  Admit date:     10/04/2023  Discharge date: 10/07/23  Discharge Physician: MDALA-GAUSI, Jilda Most   PCP: Allene Ivan, MD   Recommendations at discharge:    Please follow up with Gastroenterology  Discharge Diagnoses: Principal Problem:   Acute pancreatitis  Resolved Problems:   * No resolved hospital problems. [facility] Course: 13 y o woman with PMH of  external hemorrhoids, causing occasional bleeding, history of pancreatitis, post cholecystectomy due to gallstones in 2017, Class III obesity who initially presented to [facility] ED with complaints of right upper quadrant abdominal pain radiating to her lower back across her flank bilaterally.   Contrasted CT abdomen pelvis showed acute pancreatitis, underlying developing mass of the uncinate process not excluded.  The patient received IV fluid hydration.  TRH, hospitalist service was asked to admit.  Accepted by Dr. [facility] Lavender and transferred to [facility] MedSurg unit.   Due to concern for pancreatic mass MRI pancreatic head was obtained.  The rest of the hospital course is in problem-based format below:    Assessment and Plan:  Acute pancreatitis, seen on CT scan Presented with abdominal pain. Lipase 36 wnl, triglycerides level 221 slightly elevated Post cholecystectomy in 2017.  No biliary dilatation seen on CT scan. Concern for possible pancreatic mass Patient was treated with pain medicine, IV fluids. Abdominal pain improved and she was able to tolerate a diet. She was discharged home in stable condition.  Concern for mass in head of pancreas Concern for possible pancreatic mass noted on CT report. MRI of pancreas done and showed: Uncinate process acute pancreatitis, no significant fluid collection.  Mass cannot be ruled out, recommended endoscopic ultrasound.  No pseudocyst or abscess.  Hepatomegaly with  steatosis.  No biliary calculi. CA 19-9: 11 Endoscopic ultrasound deferred to outpatient setting after acute episode of pancreatitis as evaluation will be more conclusive when inflammatory process resolves. Message sent to patient's gastroenterologist to possibly schedule EUS with planned colonoscopy.   Right upper quadrant abdominal pain Status post cholecystectomy in 2017 LFTs were within normal range   Class III Obesity BMI 52 For outpatient follow up.       Consultants: n/a Procedures performed: n/a  Disposition: Home Diet recommendation:  Discharge Diet Orders (From admission, onward)     Start     Ordered   10/07/23 0000  Diet general       Comments: Low fat diet   10/07/23 0953           DISCHARGE MEDICATION: Allergies as of 10/07/2023   No Known Allergies      Medication List     TAKE these medications    ASHWAGANDHA PO Take 1,000 mg by mouth daily.   BERBERINE COMPLEX PO Take 200 mg by mouth daily. Turmeric   ondansetron  4 MG disintegrating tablet Commonly known as: ZOFRAN -ODT Take 1 tablet (4 mg total) by mouth every 8 (eight) hours as needed for nausea or vomiting.   oxyCODONE  5 MG immediate release tablet Commonly known as: Oxy IR/ROXICODONE  Take 1 tablet (5 mg total) by mouth every 6 (six) hours as needed for moderate pain (pain score 4-6).   RED YEAST RICE PO Take 1,215 mg by mouth daily. Co Q 10        Discharge Exam: Filed Weights   10/04/23 1432  Weight: (!) 139.3 kg     General: Alert, oriented X3  Eyes: Pupils  equal, reactive  Oral cavity: moist mucous membranes  Head: Atraumatic, normocephalic  Neck: supple  Chest: clear to auscultation. No crackles, no wheezes  CVS: S1,S2 RRR. No murmurs  Abd: No distention, soft, mild LUQ and RLQ tenderness. No masses palpable  Extr: No edema   MSK: No joint deformities or swelling  Neurological: Grossly intact.    Condition at discharge: good  The results of significant  diagnostics from this hospitalization (including imaging, microbiology, ancillary and laboratory) are listed below for reference.   Imaging Studies: MR ABDOMEN MRCP W WO CONTAST Result Date: 10/05/2023 CLINICAL DATA:  Pancreatitis with possible developing uncinate process lesion. EXAM: MRI ABDOMEN WITHOUT AND WITH CONTRAST (INCLUDING MRCP) TECHNIQUE: Multiplanar multisequence MR imaging of the abdomen was performed both before and after the administration of intravenous contrast. Heavily T2-weighted images of the biliary and pancreatic ducts were obtained, and three-dimensional MRCP images were rendered by post processing. CONTRAST:  10mL GADAVIST  GADOBUTROL  1 MMOL/ML IV SOLN COMPARISON:  CT scan 10/04/2023 FINDINGS: Lower chest: No acute findings. Hepatobiliary: Liver measures 21.3 cm craniocaudal length. Loss of signal intensity within the liver parenchyma diffusely on out of phase T1 imaging is consistent with fatty deposition. Although postcontrast imaging of the liver is motion degraded, no focal hepatic mass lesion evident. Gallbladder surgically absent. No intrahepatic or extrahepatic biliary dilation. Extrahepatic common bile duct measures 5 mm diameter in the head of the pancreas. MRCP imaging shows no evidence of choledocholithiasis. Pancreas: No main duct dilatation. There is edema in and around the head of the pancreas extending into the pancreatico duodenal groove and along the descending and proximal transverse duodenum. Confluent smudgy T2 hyperintensity is identified in the uncinate process as noted on recent CT. This region shows subtle hyper enhancement relative to background pancreatic parenchyma without evidence for restricted diffusion. Spleen:  No splenomegaly. No suspicious focal mass lesion. Adrenals/Urinary Tract: No adrenal nodule or mass. Kidneys unremarkable. Stomach/Bowel: Stomach is unremarkable. No gastric wall thickening. No evidence of outlet obstruction. Duodenum is normally  positioned as is the ligament of Treitz. No small bowel or colonic dilatation within the visualized abdomen. Vascular/Lymphatic: No abdominal aortic aneurysm. The portal vein and superior mesenteric vein are patent. Splenic vein is patent. Upper normal lymph nodes are seen in the hepatoduodenal ligament. Other:  No substantial intraperitoneal free fluid. Musculoskeletal: No focal suspicious marrow enhancement within the visualized bony anatomy. IMPRESSION: 1. Edema in and around the head of the pancreas extending into the pancreatico duodenal groove and along the descending and proximal transverse duodenum. Confluent smudgy T2 hyperintensity is identified in the uncinate process creating a "masslike" appearance similar to noted on recent CT. This region shows subtle hyper enhancement relative to background pancreatic parenchyma without evidence for restricted diffusion. Findings are likely related to sequelae of acute pancreatitis although underlying mass lesion is not excluded and continued close follow-up recommended. Endoscopic ultrasound may prove helpful to further evaluate. 2. No evidence for pseudocyst or abscess. 3. Hepatomegaly with hepatic steatosis. 4. Status post cholecystectomy. No intrahepatic or extrahepatic biliary dilation. MRCP imaging shows no evidence of choledocholithiasis. Electronically Signed   By: Donnal Fusi M.D.   On: 10/05/2023 12:34   MR 3D Recon At Scanner Result Date: 10/05/2023 CLINICAL DATA:  Pancreatitis with possible developing uncinate process lesion. EXAM: MRI ABDOMEN WITHOUT AND WITH CONTRAST (INCLUDING MRCP) TECHNIQUE: Multiplanar multisequence MR imaging of the abdomen was performed both before and after the administration of intravenous contrast. Heavily T2-weighted images of the biliary and pancreatic ducts were  obtained, and three-dimensional MRCP images were rendered by post processing. CONTRAST:  10mL GADAVIST  GADOBUTROL  1 MMOL/ML IV SOLN COMPARISON:  CT scan  10/04/2023 FINDINGS: Lower chest: No acute findings. Hepatobiliary: Liver measures 21.3 cm craniocaudal length. Loss of signal intensity within the liver parenchyma diffusely on out of phase T1 imaging is consistent with fatty deposition. Although postcontrast imaging of the liver is motion degraded, no focal hepatic mass lesion evident. Gallbladder surgically absent. No intrahepatic or extrahepatic biliary dilation. Extrahepatic common bile duct measures 5 mm diameter in the head of the pancreas. MRCP imaging shows no evidence of choledocholithiasis. Pancreas: No main duct dilatation. There is edema in and around the head of the pancreas extending into the pancreatico duodenal groove and along the descending and proximal transverse duodenum. Confluent smudgy T2 hyperintensity is identified in the uncinate process as noted on recent CT. This region shows subtle hyper enhancement relative to background pancreatic parenchyma without evidence for restricted diffusion. Spleen:  No splenomegaly. No suspicious focal mass lesion. Adrenals/Urinary Tract: No adrenal nodule or mass. Kidneys unremarkable. Stomach/Bowel: Stomach is unremarkable. No gastric wall thickening. No evidence of outlet obstruction. Duodenum is normally positioned as is the ligament of Treitz. No small bowel or colonic dilatation within the visualized abdomen. Vascular/Lymphatic: No abdominal aortic aneurysm. The portal vein and superior mesenteric vein are patent. Splenic vein is patent. Upper normal lymph nodes are seen in the hepatoduodenal ligament. Other:  No substantial intraperitoneal free fluid. Musculoskeletal: No focal suspicious marrow enhancement within the visualized bony anatomy. IMPRESSION: 1. Edema in and around the head of the pancreas extending into the pancreatico duodenal groove and along the descending and proximal transverse duodenum. Confluent smudgy T2 hyperintensity is identified in the uncinate process creating a "masslike"  appearance similar to noted on recent CT. This region shows subtle hyper enhancement relative to background pancreatic parenchyma without evidence for restricted diffusion. Findings are likely related to sequelae of acute pancreatitis although underlying mass lesion is not excluded and continued close follow-up recommended. Endoscopic ultrasound may prove helpful to further evaluate. 2. No evidence for pseudocyst or abscess. 3. Hepatomegaly with hepatic steatosis. 4. Status post cholecystectomy. No intrahepatic or extrahepatic biliary dilation. MRCP imaging shows no evidence of choledocholithiasis. Electronically Signed   By: Donnal Fusi M.D.   On: 10/05/2023 12:34   CT ABDOMEN PELVIS W CONTRAST Result Date: 10/04/2023 CLINICAL DATA:  Abdominal pain, acute, nonlocalized Abdominal/flank pain, stone suspected EXAM: CT ABDOMEN AND PELVIS WITH CONTRAST TECHNIQUE: Multidetector CT imaging of the abdomen and pelvis was performed using the standard protocol following bolus administration of intravenous contrast. RADIATION DOSE REDUCTION: This exam was performed according to the departmental dose-optimization program which includes automated exposure control, adjustment of the mA and/or kV according to patient size and/or use of iterative reconstruction technique. CONTRAST:  OMNIPAQUE  IOHEXOL  300 MG/ML  SOLN COMPARISON:  CT abdomen pelvis 07/23/2021, CT abdomen pelvis 05/09/2023 FINDINGS: Lower chest: No acute abnormality. Hepatobiliary: No focal liver abnormality. Status post cholecystectomy. No biliary dilatation. Pancreas: Mildly atrophic. Hazy contour of the uncinate process with slight masslike appearance. Associated mild fat stranding along the uncinate process. No main pancreatic ductal dilatation. No pseudocyst formation. Spleen: Normal in size without focal abnormality. Adrenals/Urinary Tract: No adrenal nodule bilaterally. Bilateral kidneys enhance symmetrically. No hydronephrosis. No hydroureter. The  urinary bladder is unremarkable. Stomach/Bowel: Stomach is within normal limits. No evidence of bowel wall thickening or dilatation. Colonic diverticulosis. Appendix appears normal. Vascular/Lymphatic: No abdominal aorta or iliac aneurysm. Mild atherosclerotic plaque of  the aorta and its branches. Persistent prominent but nonenlarged retroperitoneal and mesenteric lymph nodes. No abdominal, pelvic, or inguinal lymphadenopathy. Reproductive: Uterus and bilateral adnexa are unremarkable. Other: No intraperitoneal free fluid. No intraperitoneal free gas. No organized fluid collection. Musculoskeletal: No abdominal wall hernia or abnormality. No suspicious lytic or blastic osseous lesions. No acute displaced fracture. IMPRESSION: 1. Acute pancreatitis. Underlying developing mass of the uncinate process not excluded. When the patient is clinically stable and able to follow directions and hold their breath (preferably as an outpatient) further evaluation with dedicated MRI pancreatic protocol should be considered (after acute pancreatitis resolution). 2. Status post cholecystectomy. Electronically Signed   By: Morgane  Naveau M.D.   On: 10/04/2023 20:23    Microbiology: No results found for this or any previous visit.  Labs: CBC: Recent Labs  Lab 10/04/23 1632 10/05/23 0610 10/06/23 0524  WBC 15.3* 12.1* 7.8  HGB 13.8 12.9 12.1  HCT 42.1 41.8 38.3  MCV 82.5 88.2 86.7  PLT 348 303 289   Basic Metabolic Panel: Recent Labs  Lab 10/04/23 1632 10/05/23 0610 10/06/23 0524 10/07/23 0731  NA 135 135 136 139  K 4.2 3.8 3.6 4.4  CL 100 103 103 104  CO2 25 22 23 27   GLUCOSE 101* 103* 94 96  BUN 15 11 9 10   CREATININE 0.82 0.72 0.68 0.79  CALCIUM 9.6 8.8* 8.6* 8.8*  MG  --  2.1 2.3  --   PHOS  --  3.9 3.2  --    Liver Function Tests: Recent Labs  Lab 10/04/23 1632 10/06/23 0524 10/07/23 0731  AST 19 46* 70*  ALT 19 72* 90*  ALKPHOS 64 65 67  BILITOT 0.3 0.8 0.6  PROT 8.0 6.8 6.9   ALBUMIN 4.5 3.6 3.5   CBG: No results for input(s): "GLUCAP" in the last 168 hours.  Discharge time spent: greater than 30 minutes.  Signed: MDALA-GAUSI, Pravin Perezperez AGATHA, MD [facility] 10/07/2023

## 2023-10-07 NOTE — Plan of Care (Signed)

## 2023-10-09 ENCOUNTER — Ambulatory Visit (HOSPITAL_COMMUNITY): Admission: RE | Admit: 2023-10-09 | Payer: Medicaid Other | Source: Home / Self Care | Admitting: Gastroenterology

## 2023-10-09 ENCOUNTER — Telehealth: Payer: Self-pay | Admitting: *Deleted

## 2023-10-09 DIAGNOSIS — K859 Acute pancreatitis without necrosis or infection, unspecified: Secondary | ICD-10-CM

## 2023-10-09 DIAGNOSIS — R1011 Right upper quadrant pain: Secondary | ICD-10-CM

## 2023-10-09 HISTORY — DX: Pneumonia, unspecified organism: J18.9

## 2023-10-09 SURGERY — COLONOSCOPY WITH PROPOFOL
Anesthesia: Monitor Anesthesia Care

## 2023-10-09 NOTE — Telephone Encounter (Signed)
 Patient has been scheduled for RUQ abdominal ultrasound at [facility] on Wednesday, 10/11/23 at 7:30 am, 7 am arrival time. NPO midnight.  Patient is scheduled for CT abdomen pancreatic protocol at [facility] on Tuesday, 10/31/23 at 10:30 am, 10 am arrival, NPO 4 hours prior.  Patient is scheduled for office follow up with Dr General Kenner on Friday, 11/03/23 at 9:50 am.  Dr General Kenner, did you want me to go ahead and tentatively reschedule hospital colonoscopy for your available 11/23/23 date or did you want to see her in the office first to determine?

## 2023-10-09 NOTE — Telephone Encounter (Signed)
-----   Message from Nurse Patty P sent at 10/05/2023  4:53 PM EDT ----- Regarding: FW: Followup  ----- Message ----- From: Normie Becton., MD Sent: 10/05/2023   4:47 PM EDT To: Ace Holder, MD; # Subject: Followup                                       This is a Dr. General Kenner patient. This patient is currently being brought in for observation for episode of pancreatitis. Etiology unclear. Imaging more suggestive of pancreatitis though cannot rule out subtle mass. She is being worked up by the medicine service and GI has not been formally consulted other than to review the MRI at this time. She has an upcoming colonoscopy scheduled next week as well. Here is the plan of action: 1) reschedule colonoscopy for next week 2) patient should have a pancreas protocol CT abdomen performed in 4 weeks (under Dr. Tena Feeling name) 3) I am happy to review the CT scan when it returns to decide if any additional workup will be required Thanks. GM

## 2023-10-09 NOTE — Telephone Encounter (Signed)
 I have spoken to patient to advise of recommendations from Dr Brice Campi and Dr General Kenner. Discussed appointment times/dates/locations/prep for u/s, CT, office visit and rescheduled hospital colonoscopy. Patient verbalizes understanding of each and denies any questions at this time.

## 2023-10-09 NOTE — Telephone Encounter (Signed)
 Patient has been rescheduled for colonoscopy at [facility] on Thursday 11/23/23 at 1015 am, 845 am arrival. New Case ID [id number]; scheduled with Shay.

## 2023-10-09 NOTE — Telephone Encounter (Signed)
 [name] thank you - yes let's tentatively book her in that slot for a hospital case in June. When I see her in May we can review it to make sure she is ready for it. Appreciate your help with this.

## 2023-10-09 NOTE — Telephone Encounter (Signed)
===  View-only below this line=== ----- Message ----- From: Ace Holder, MD Sent: 10/09/2023   8:01 AM EDT To: Normie Becton., MD; Lbgi Pod A Triage  Gabe, thanks. Sorry I think I missed the name on your prior message initially, had a lot of messages to sort through.   Appreciate your advice.  POD A RN - can you please schedule this patient for RUQ US  , rule out gallstones, and a CT Scan pancreas protocol in 3-4 weeks from now. Also needs follow up with me or APP in 1 month or so.  Thanks ----- Message ----- From: Normie Becton., MD Sent: 10/08/2023  10:42 AM EDT To: Ace Holder, MD; Lbgi Pod A Triage  SA, I sent a separate message to your team about this. Recommend RUQUS which is more sensitive that CT for rule out cholelithiasis Repeat cross-sectional imaging in 3-4 weeks with a pancreas protocol CT abdomen. Pending workup and follow-up, then may need EUS if no other reason is found. Just update me with what the imaging studies end up showing. Will adjust schedule if there remains abnormality in the pancreas, but more likely this is just inflammatory in nature and she has a normal CA 19-9. I will not be able to accommodate upper EUS and colonoscopy at the same time, so I would just let her colonoscopy be rescheduled with you as able. GM ----- Message ----- From: Ace Holder, MD Sent: 10/08/2023   9:58 AM EDT To: Normie Becton., MD; Lbgi Pod A Triage  POD A RN can you please book this patient a follow up with me or APP in the next 1 month or so, post hospitalization, pancreatitis.  Gabe I have never met this patient but staffed her with [facility]. New episode of pancreaitis, please see report below. Sounds like she may need an EUS, rule out pancreatic mass. Not sure if you want us  to see her back in the office first and then coordinate pending her course. If you want to direct book her / save her a spot in your schedule we can call her to  see how she feels about it. Thanks  Siegfried Dress ----- Message ----- From: Audelia Blazer, MD Sent: 10/07/2023   9:48 AM EDT To: Ace Holder, MD  Hi,  This patient was admitted with acute pancreatitis and is being discharged home today.  She had a prior appointment with you for a colonoscopy on 4/21, which has been canceled.  Notably, she had an MRCP while here which showed an area on the head of the pancreas that was somewhat concerning for a mass.  EUS was advised.  I felt it would be appropriate for her to have this evaluated after the acute flare of pancreatitis.  Could you possibly schedule her for both an EUS and a colonoscopy in the next few weeks?  Thanks, Penny Boxer, MD Hospitalist

## 2023-10-11 ENCOUNTER — Ambulatory Visit (HOSPITAL_COMMUNITY)
Admission: RE | Admit: 2023-10-11 | Discharge: 2023-10-11 | Disposition: A | Discharge status: Home / Self Care | Source: Ambulatory Visit | Attending: Gastroenterology | Admitting: Gastroenterology

## 2023-10-11 ENCOUNTER — Encounter: Payer: Self-pay | Admitting: Gastroenterology

## 2023-10-11 DIAGNOSIS — R1011 Right upper quadrant pain: Secondary | ICD-10-CM | POA: Diagnosis present

## 2023-10-11 DIAGNOSIS — K859 Acute pancreatitis without necrosis or infection, unspecified: Secondary | ICD-10-CM | POA: Insufficient documentation

## 2023-10-11 NOTE — Telephone Encounter (Signed)
 Spoke to patient and clarified imaging appointments/reasoning. She denies any additional questions at this time.

## 2023-10-11 NOTE — Telephone Encounter (Signed)
 Inbound call from patient, states she still has questions on CT, she states today was about her liver, but she thought it was for her pancreas. Advised pt the one for her pancreas is scheduled for 5/13. Patient stated she would not like to speak to me but with a nurse in regards to information below. Please advise.

## 2023-10-12 NOTE — Telephone Encounter (Signed)
 Alexis Howell

## 2023-10-31 ENCOUNTER — Encounter (HOSPITAL_COMMUNITY): Payer: Self-pay

## 2023-10-31 ENCOUNTER — Ambulatory Visit (HOSPITAL_COMMUNITY)
Admission: RE | Admit: 2023-10-31 | Discharge: 2023-10-31 | Disposition: A | Discharge status: Home / Self Care | Source: Ambulatory Visit | Attending: Gastroenterology | Admitting: Gastroenterology

## 2023-10-31 DIAGNOSIS — K859 Acute pancreatitis without necrosis or infection, unspecified: Secondary | ICD-10-CM | POA: Diagnosis present

## 2023-10-31 DIAGNOSIS — R1011 Right upper quadrant pain: Secondary | ICD-10-CM | POA: Insufficient documentation

## 2023-10-31 MED ORDER — IOHEXOL 300 MG/ML  SOLN
100.0000 mL | Freq: Once | INTRAMUSCULAR | Status: AC | PRN
  Administered 2023-10-31: 100 mL via INTRAVENOUS

## 2023-11-01 ENCOUNTER — Ambulatory Visit: Payer: Self-pay | Admitting: Gastroenterology

## 2023-11-03 ENCOUNTER — Encounter: Payer: Self-pay | Admitting: Gastroenterology

## 2023-11-03 ENCOUNTER — Ambulatory Visit: Payer: Self-pay | Admitting: Gastroenterology

## 2023-11-03 ENCOUNTER — Other Ambulatory Visit (INDEPENDENT_AMBULATORY_CARE_PROVIDER_SITE_OTHER)

## 2023-11-03 ENCOUNTER — Ambulatory Visit: Admitting: Gastroenterology

## 2023-11-03 VITALS — BP 110/70 | HR 94 | Ht 64.0 in | Wt 310.0 lb

## 2023-11-03 DIAGNOSIS — K760 Fatty (change of) liver, not elsewhere classified: Secondary | ICD-10-CM

## 2023-11-03 DIAGNOSIS — R194 Change in bowel habit: Secondary | ICD-10-CM | POA: Diagnosis not present

## 2023-11-03 DIAGNOSIS — Z8719 Personal history of other diseases of the digestive system: Secondary | ICD-10-CM | POA: Diagnosis not present

## 2023-11-03 DIAGNOSIS — R7401 Elevation of levels of liver transaminase levels: Secondary | ICD-10-CM | POA: Diagnosis not present

## 2023-11-03 LAB — HEPATIC FUNCTION PANEL
ALT: 21 U/L (ref 0–35)
AST: 20 U/L (ref 0–37)
Albumin: 4.5 g/dL (ref 3.5–5.2)
Alkaline Phosphatase: 68 U/L (ref 39–117)
Bilirubin, Direct: 0.1 mg/dL (ref 0.0–0.3)
Total Bilirubin: 0.6 mg/dL (ref 0.2–1.2)
Total Protein: 7.7 g/dL (ref 6.0–8.3)

## 2023-11-03 MED ORDER — COLESTIPOL HCL 1 G PO TABS
1.0000 g | ORAL_TABLET | Freq: Two times a day (BID) | ORAL | 1 refills | Status: DC
Start: 2023-11-03 — End: 2024-01-03

## 2023-11-03 NOTE — Progress Notes (Signed)
 HPI :  43 year old female with a history of pancreatitis, rectal bleeding, here for a follow-up visit.  Recall she had a history of pancreatitis in December 2022 without a clear cause.  Unfortunately this past April she presented to the hospital with abdominal pain and found to have recurrent acute pancreatitis.  This was seen on a CT scan on April 16.  At the time of that exam her AST and ALT were normal along with normal alk phos and normal total bili.  Triglycerides were normal.  Incidentally her ALT and AST had risen slightly the 70s and 90s during that stay.  She has had a history of intermittently elevated liver enzymes.  She is status post cholecystectomy.  On the CT scan there was a question of possible masslike appearance in the uncinate process.  She had an MRCP done on April 17 which suggested this could be more related to sequela of acute pancreatitis although they could not necessarily exclude a mass lesion.  She had a CA 19-9 level that was normal.  She had a repeat CT of her pancreas on May 13 and there is no mass that is seen, it is normal.   She states her index symptoms or pain that started in her back and wraps around to her epigastric area.  Since her hospitalization and with time that has really gotten better.  She has chronic mid back pain which is stable.  She endorses 2 clear episodes of pancreatitis.  She had a cholecystectomy in 2018 before this started.  She has roughly 1-2 alcoholic beverages per month and does not drink on a routine basis.  She is not aware of any family history of pancreatic cancer or pancreatitis.  She denied any new medications around the time of this occurrence.  I see ashwaganda supplement listed on her medications although she states she stopped this last August.  She had been taking berberine which is not listed to be associated with pancreatitis.   She has had some altered bowel habits over the past few months since she was hospitalized.  She  states she has multiple loose stools per day, usually after eating.  She was started on Creon empirically and states that has not really helped her at all.  She denies any fat or grease in her stools.  She has 2 meals a day and is trying to eat less due to the diarrhea it causes.  She has had some night sweats at times.  Also of note she has hepatic steatosis noted on imaging.  Again she does not drink much of any alcohol. Her BMI is currently 53.2.  She states she gained about 50 pounds back in 2022, BMI has been over 50 lately.  We discussed fatty liver in general She has not had additional workup for elevated liver enzymes.     Of note she is scheduled to have a colonoscopy with me at the hospital next month for rectal bleeding.  She has had periodic rectal bleeding over time she is attributed this to hemorrhoids   PREVIOUS GI WORKUP    Ct ab/pelvis 04/2023 IMPRESSION: No acute findings or other significant abnormality. No radiographic evidence of urinary tract neoplasm, urolithiasis, or hydronephrosis.   CTAP w contrast 07/2021 IMPRESSION: 1. Small lymph nodes scattered within the central mesentery, upper abdomen and retroperitoneum, abnormal by number, but no enlarged lymph nodes present. This mild lymphadenopathy is a nonspecific finding but can be seen in the setting of a mesenteric adenitis  or possibly residual reactive lymphadenopathy related to the earlier reported episode of pancreatitis. Recommend follow-up CT in 3-6 months to ensure stability or resolution and exclude the possibility of this representing early lymphoma. Alternatively, could consider nonemergent PET-CT now for further characterization. 2. Remainder of the abdomen and pelvis CT is unremarkable. No bowel obstruction or evidence of bowel wall inflammation. No evidence of pancreatitis. No renal or ureteral calculi. Appendix is normal.   EGD 08/2021: upper abdominal pain - Mild, non- specific gastritis. Biopsied  to check for H. pylori.  - The examination was otherwise normal.   FINAL MICROSCOPIC DIAGNOSIS:   A.   STOMACH, BODY AND ANTRUM, BIOPSY:  -    Mild reactive gastropathy.  -    Negative for an inflammatory pattern predictive of Helicobacter  pylori infection.  -    Negative for intestinal metaplasia.  -    Negative for malignancy.      CT abdomen / pelvis 10/04/23: IMPRESSION: 1. Acute pancreatitis. Underlying developing mass of the uncinate process not excluded. When the patient is clinically stable and able to follow directions and hold their breath (preferably as an outpatient) further evaluation with dedicated MRI pancreatic protocol should be considered (after acute pancreatitis resolution). 2. Status post cholecystectomy.   MRCP 10/04/13: IMPRESSION: 1. Edema in and around the head of the pancreas extending into the pancreatico duodenal groove and along the descending and proximal transverse duodenum. Confluent smudgy T2 hyperintensity is identified in the uncinate process creating a "masslike" appearance similar to noted on recent CT. This region shows subtle hyper enhancement relative to background pancreatic parenchyma without evidence for restricted diffusion. Findings are likely related to sequelae of acute pancreatitis although underlying mass lesion is not excluded and continued close follow-up recommended. Endoscopic ultrasound may prove helpful to further evaluate. 2. No evidence for pseudocyst or abscess. 3. Hepatomegaly with hepatic steatosis. 4. Status post cholecystectomy. No intrahepatic or extrahepatic biliary dilation. MRCP imaging shows no evidence of choledocholithiasis.     RUQ US  10/11/23: IMPRESSION: Hepatic steatosis.  Status post cholecystectomy.   CT abdomen with contrast 10/31/23: PANCREAS: No peripancreatic fluid or inflammatory changes.  CA 19-9 - 11  Trigs 221    Past Medical History:  Diagnosis Date   Delivery of pregnancy by  cesarean section: Indication: arrest of dilation 11/26/2016   Hyperlipemia    Obesity    Pancreatitis    two episodes - 2022 and 2025 - unclear etiology   PCOS (polycystic ovarian syndrome)    Pneumonia    in her 20's   Prolonged first stage (of labor) 11/26/2016     Past Surgical History:  Procedure Laterality Date   BIOPSY  09/09/2021   Procedure: BIOPSY;  Surgeon: Janel Medford, MD;  Location: [facility];  Service: Endoscopy;;   CESAREAN SECTION N/A 11/26/2016   Procedure: CESAREAN SECTION;  Surgeon: Audelia Leaks, MD;  Location: [facility];  Service: Obstetrics;  Laterality: N/A;   CHOLECYSTECTOMY N/A 08/12/2014   Procedure: LAPAROSCOPIC CHOLECYSTECTOMY WITH INTRAOPERATIVE CHOLANGIOGRAM;  Surgeon: Fran Imus, MD;  Location: [facility];  Service: General;  Laterality: N/A;   ESOPHAGOGASTRODUODENOSCOPY (EGD) WITH PROPOFOL  N/A 09/09/2021   Procedure: ESOPHAGOGASTRODUODENOSCOPY (EGD) WITH PROPOFOL ;  Surgeon: Janel Medford, MD;  Location: [facility];  Service: Endoscopy;  Laterality: N/A;   TONSILLECTOMY AND ADENOIDECTOMY     WISDOM TOOTH EXTRACTION     Family History  Problem Relation Age of Onset   Heart disease Mother    Diverticulitis Mother  Seizures Sister    Diverticulitis Sister    Mental illness Paternal Grandmother    Social History   Tobacco Use   Smoking status: Former    Current packs/day: 0.00    Types: Cigarettes    Quit date: 07/30/2009    Years since quitting: 14.2   Smokeless tobacco: Never  Vaping Use   Vaping status: Never Used  Substance Use Topics   Alcohol use: Yes    Alcohol/week: 0.0 standard drinks of alcohol    Comment: OCCASIONAL   Drug use: No   Current Outpatient Medications  Medication Sig Dispense Refill   ALPRAZolam (XANAX) 0.25 MG tablet Take 0.25 mg by mouth 2 (two) times daily.     colestipol (COLESTID) 1 g tablet Take 1 tablet (1 g total) by mouth 2 (two) times daily. 60 tablet 1   ondansetron  (ZOFRAN -ODT) 4 MG  disintegrating tablet Take 1 tablet (4 mg total) by mouth every 8 (eight) hours as needed for nausea or vomiting. 20 tablet 0   oxyCODONE  (OXY IR/ROXICODONE ) 5 MG immediate release tablet Take 1 tablet (5 mg total) by mouth every 6 (six) hours as needed for moderate pain (pain score 4-6). 20 tablet 0   Red Yeast Rice Extract (RED YEAST RICE PO) Take 1,215 mg by mouth daily. Co Q 10     ASHWAGANDHA PO Take 1,000 mg by mouth daily. (Patient not taking: Reported on 11/03/2023)     Barberry-Oreg Grape-Goldenseal (BERBERINE COMPLEX PO) Take 200 mg by mouth daily. Turmeric (Patient not taking: Reported on 11/03/2023)     No current facility-administered medications for this visit.   No Known Allergies   Review of Systems: All systems reviewed and negative except where noted in HPI.    CT ABDOMEN W CONTRAST Result Date: 10/31/2023 EXAM: CT ABDOMEN WITH CONTRAST 10/31/2023 11:27:41 AM TECHNIQUE: CT of the abdomen was performed with the administration of intravenous contrast. Multiplanar reformatted images are provided for review. Automated exposure control, iterative reconstruction, and/or weight based adjustment of the mA/kV was utilized to reduce the radiation dose to as low as reasonably achievable. COMPARISON: 10/04/2023 CLINICAL HISTORY: RUQ abdominal pain, pancreatitis. [id number]; Recent pancreatitis. Still having troubles.; Pt states she keeps getting pancreatitis. FINDINGS: LOWER CHEST: Visualized portion of the lower chest demonstrates no acute abnormality. HEPATOBILIARY: Status post cholecystectomy. SPLEEN: Spleen demonstrates no acute abnormality. PANCREAS: No peripancreatic fluid or inflammatory changes. ADRENAL GLANDS: Adrenal glands demonstrate no acute abnormality. KIDNEYS: No evidence of stones in the kidneys or ureters. No evidence of hydronephrosis. No evidence of perinephric or periureteral stranding. GI AND BOWEL: Mild left colonic diverticulosis without evidence of diverticulitis.  Normal appendix (image 88). Stomach and duodenal sweep demonstrate no acute abnormality. There is no evidence of bowel obstruction. No evidence of abnormal bowel wall thickening or distension. PERITONEUM AND RETROPERITONEUM: No evidence of ascites or free air. Aorta is normal in caliber. LYMPH NODES: Small upper abdominal and retroperitoneal lymph nodes which do not meet pathologic CT size criteria. BONES AND SOFT TISSUES: No acute abnormality of the visualized bones. No focal soft tissue abnormality. IMPRESSION: 1. No CT evidence of pancreatitis. Electronically signed by: Zadie Herter MD 10/31/2023 10:15 PM EDT RP Workstation: [id number]   US  Abdomen Limited RUQ (LIVER/GB) Result Date: 10/11/2023 CLINICAL DATA:  RUQ pain, pancreatitis; R/O cholelithiasis EXAM: ULTRASOUND ABDOMEN LIMITED RIGHT UPPER QUADRANT COMPARISON:  October 05, 2023. FINDINGS: Gallbladder: Surgically absent Common bile duct: Not discretely visualized.  No intrahepatic biliary ductal dilation. Liver: No focal lesion identified. Diffusely  increased in parenchymal echogenicity with poor acoustic penetration. Portal vein is grossly patent on color Doppler imaging with normal direction of blood flow towards the liver. Other: None. IMPRESSION: Hepatic steatosis.  Status post cholecystectomy. Electronically Signed   By: Clancy Crimes M.D.   On: 10/11/2023 07:51   MR ABDOMEN MRCP W WO CONTAST Result Date: 10/05/2023 CLINICAL DATA:  Pancreatitis with possible developing uncinate process lesion. EXAM: MRI ABDOMEN WITHOUT AND WITH CONTRAST (INCLUDING MRCP) TECHNIQUE: Multiplanar multisequence MR imaging of the abdomen was performed both before and after the administration of intravenous contrast. Heavily T2-weighted images of the biliary and pancreatic ducts were obtained, and three-dimensional MRCP images were rendered by post processing. CONTRAST:  10mL GADAVIST  GADOBUTROL  1 MMOL/ML IV SOLN COMPARISON:  CT scan 10/04/2023 FINDINGS: Lower  chest: No acute findings. Hepatobiliary: Liver measures 21.3 cm craniocaudal length. Loss of signal intensity within the liver parenchyma diffusely on out of phase T1 imaging is consistent with fatty deposition. Although postcontrast imaging of the liver is motion degraded, no focal hepatic mass lesion evident. Gallbladder surgically absent. No intrahepatic or extrahepatic biliary dilation. Extrahepatic common bile duct measures 5 mm diameter in the head of the pancreas. MRCP imaging shows no evidence of choledocholithiasis. Pancreas: No main duct dilatation. There is edema in and around the head of the pancreas extending into the pancreatico duodenal groove and along the descending and proximal transverse duodenum. Confluent smudgy T2 hyperintensity is identified in the uncinate process as noted on recent CT. This region shows subtle hyper enhancement relative to background pancreatic parenchyma without evidence for restricted diffusion. Spleen:  No splenomegaly. No suspicious focal mass lesion. Adrenals/Urinary Tract: No adrenal nodule or mass. Kidneys unremarkable. Stomach/Bowel: Stomach is unremarkable. No gastric wall thickening. No evidence of outlet obstruction. Duodenum is normally positioned as is the ligament of Treitz. No small bowel or colonic dilatation within the visualized abdomen. Vascular/Lymphatic: No abdominal aortic aneurysm. The portal vein and superior mesenteric vein are patent. Splenic vein is patent. Upper normal lymph nodes are seen in the hepatoduodenal ligament. Other:  No substantial intraperitoneal free fluid. Musculoskeletal: No focal suspicious marrow enhancement within the visualized bony anatomy. IMPRESSION: 1. Edema in and around the head of the pancreas extending into the pancreatico duodenal groove and along the descending and proximal transverse duodenum. Confluent smudgy T2 hyperintensity is identified in the uncinate process creating a "masslike" appearance similar to noted on  recent CT. This region shows subtle hyper enhancement relative to background pancreatic parenchyma without evidence for restricted diffusion. Findings are likely related to sequelae of acute pancreatitis although underlying mass lesion is not excluded and continued close follow-up recommended. Endoscopic ultrasound may prove helpful to further evaluate. 2. No evidence for pseudocyst or abscess. 3. Hepatomegaly with hepatic steatosis. 4. Status post cholecystectomy. No intrahepatic or extrahepatic biliary dilation. MRCP imaging shows no evidence of choledocholithiasis. Electronically Signed   By: Donnal Fusi M.D.   On: 10/05/2023 12:34   MR 3D Recon At Scanner Result Date: 10/05/2023 CLINICAL DATA:  Pancreatitis with possible developing uncinate process lesion. EXAM: MRI ABDOMEN WITHOUT AND WITH CONTRAST (INCLUDING MRCP) TECHNIQUE: Multiplanar multisequence MR imaging of the abdomen was performed both before and after the administration of intravenous contrast. Heavily T2-weighted images of the biliary and pancreatic ducts were obtained, and three-dimensional MRCP images were rendered by post processing. CONTRAST:  10mL GADAVIST  GADOBUTROL  1 MMOL/ML IV SOLN COMPARISON:  CT scan 10/04/2023 FINDINGS: Lower chest: No acute findings. Hepatobiliary: Liver measures 21.3 cm craniocaudal length. Loss of  signal intensity within the liver parenchyma diffusely on out of phase T1 imaging is consistent with fatty deposition. Although postcontrast imaging of the liver is motion degraded, no focal hepatic mass lesion evident. Gallbladder surgically absent. No intrahepatic or extrahepatic biliary dilation. Extrahepatic common bile duct measures 5 mm diameter in the head of the pancreas. MRCP imaging shows no evidence of choledocholithiasis. Pancreas: No main duct dilatation. There is edema in and around the head of the pancreas extending into the pancreatico duodenal groove and along the descending and proximal transverse  duodenum. Confluent smudgy T2 hyperintensity is identified in the uncinate process as noted on recent CT. This region shows subtle hyper enhancement relative to background pancreatic parenchyma without evidence for restricted diffusion. Spleen:  No splenomegaly. No suspicious focal mass lesion. Adrenals/Urinary Tract: No adrenal nodule or mass. Kidneys unremarkable. Stomach/Bowel: Stomach is unremarkable. No gastric wall thickening. No evidence of outlet obstruction. Duodenum is normally positioned as is the ligament of Treitz. No small bowel or colonic dilatation within the visualized abdomen. Vascular/Lymphatic: No abdominal aortic aneurysm. The portal vein and superior mesenteric vein are patent. Splenic vein is patent. Upper normal lymph nodes are seen in the hepatoduodenal ligament. Other:  No substantial intraperitoneal free fluid. Musculoskeletal: No focal suspicious marrow enhancement within the visualized bony anatomy. IMPRESSION: 1. Edema in and around the head of the pancreas extending into the pancreatico duodenal groove and along the descending and proximal transverse duodenum. Confluent smudgy T2 hyperintensity is identified in the uncinate process creating a "masslike" appearance similar to noted on recent CT. This region shows subtle hyper enhancement relative to background pancreatic parenchyma without evidence for restricted diffusion. Findings are likely related to sequelae of acute pancreatitis although underlying mass lesion is not excluded and continued close follow-up recommended. Endoscopic ultrasound may prove helpful to further evaluate. 2. No evidence for pseudocyst or abscess. 3. Hepatomegaly with hepatic steatosis. 4. Status post cholecystectomy. No intrahepatic or extrahepatic biliary dilation. MRCP imaging shows no evidence of choledocholithiasis. Electronically Signed   By: Donnal Fusi M.D.   On: 10/05/2023 12:34   CT ABDOMEN PELVIS W CONTRAST Result Date: 10/04/2023 CLINICAL  DATA:  Abdominal pain, acute, nonlocalized Abdominal/flank pain, stone suspected EXAM: CT ABDOMEN AND PELVIS WITH CONTRAST TECHNIQUE: Multidetector CT imaging of the abdomen and pelvis was performed using the standard protocol following bolus administration of intravenous contrast. RADIATION DOSE REDUCTION: This exam was performed according to the departmental dose-optimization program which includes automated exposure control, adjustment of the mA and/or kV according to patient size and/or use of iterative reconstruction technique. CONTRAST:  OMNIPAQUE  IOHEXOL  300 MG/ML  SOLN COMPARISON:  CT abdomen pelvis 07/23/2021, CT abdomen pelvis 05/09/2023 FINDINGS: Lower chest: No acute abnormality. Hepatobiliary: No focal liver abnormality. Status post cholecystectomy. No biliary dilatation. Pancreas: Mildly atrophic. Hazy contour of the uncinate process with slight masslike appearance. Associated mild fat stranding along the uncinate process. No main pancreatic ductal dilatation. No pseudocyst formation. Spleen: Normal in size without focal abnormality. Adrenals/Urinary Tract: No adrenal nodule bilaterally. Bilateral kidneys enhance symmetrically. No hydronephrosis. No hydroureter. The urinary bladder is unremarkable. Stomach/Bowel: Stomach is within normal limits. No evidence of bowel wall thickening or dilatation. Colonic diverticulosis. Appendix appears normal. Vascular/Lymphatic: No abdominal aorta or iliac aneurysm. Mild atherosclerotic plaque of the aorta and its branches. Persistent prominent but nonenlarged retroperitoneal and mesenteric lymph nodes. No abdominal, pelvic, or inguinal lymphadenopathy. Reproductive: Uterus and bilateral adnexa are unremarkable. Other: No intraperitoneal free fluid. No intraperitoneal free gas. No organized fluid collection.  Musculoskeletal: No abdominal wall hernia or abnormality. No suspicious lytic or blastic osseous lesions. No acute displaced fracture. IMPRESSION: 1. Acute  pancreatitis. Underlying developing mass of the uncinate process not excluded. When the patient is clinically stable and able to follow directions and hold their breath (preferably as an outpatient) further evaluation with dedicated MRI pancreatic protocol should be considered (after acute pancreatitis resolution). 2. Status post cholecystectomy. Electronically Signed   By: Morgane  Naveau M.D.   On: 10/04/2023 20:23   Lab Results  Component Value Date   WBC 7.8 10/06/2023   HGB 12.1 10/06/2023   HCT 38.3 10/06/2023   MCV 86.7 10/06/2023   PLT 289 10/06/2023    Lab Results  Component Value Date   NA 139 10/07/2023   CL 104 10/07/2023   K 4.4 10/07/2023   CO2 27 10/07/2023   BUN 10 10/07/2023   CREATININE 0.79 10/07/2023   GFRNONAA >60 10/07/2023   CALCIUM 8.8 (L) 10/07/2023   PHOS 3.2 10/06/2023   ALBUMIN 3.5 10/07/2023   GLUCOSE 96 10/07/2023    Lab Results  Component Value Date   ALT 90 (H) 10/07/2023   AST 70 (H) 10/07/2023   ALKPHOS 67 10/07/2023   BILITOT 0.6 10/07/2023    Fibrosis 4 Score = 1.05 (Low risk)        Interpretation for patients with NAFLD          <1.30       -  F0-F1 (Low risk)          1.30-2.67 -  Indeterminate           >2.67      -  F3-F4 (High risk)     Validated for ages 60-65         Physical Exam: BP 110/70   Pulse 94   Ht 5\' 4"  (1.626 m)   Wt (!) 310 lb (140.6 kg)   LMP 10/13/2023   BMI 53.21 kg/m  Constitutional: Pleasant,well-developed, female in no acute distress. Neurological: Alert and oriented to person place and time. Skin: Skin is warm and dry. No rashes noted. Psychiatric: Normal mood and affect. Behavior is normal.   ASSESSMENT: 43 y.o. female here for assessment of the following  1. History of pancreatitis   2. Altered bowel habits   3. Transaminitis   4. Fatty liver   5. Morbid obesity (HCC)    Multiple issues addressed during the visit today.  She has had now 2 episodes of pancreatitis over the past few  years, unclear etiology.  She is status post cholecystectomy, liver enzymes were normal at the time of pancreatitis however she does have a baseline mild transaminitis.  No new medications to cause this.  Follow-up imaging showed no obvious mass lesion in regards to question of this on her initial CT scan.  Not sure what is driving this process, will discuss with Dr. Brice Campi if he wanted to consider EUS for her given this recurrence.  She has clearly had some altered bowel habits and diarrhea since the hospitalization.  Creon not helping at all so we will stop that.  Will send Diatherix stool swab for C. difficile to ensure she did not pick that up at the hospital.  Otherwise we will start colestipol 1 g twice daily for 1 month trial and see if that will help in the setting of her postcholecystectomy state.  We discussed her transaminitis, very well could be due to fatty liver, but will send serologic workup to  rule out other causes and check immunity to hepatitis A and B.  I will repeat her LFTs today to see where this is trending.  We discussed risk for fibrosis and cirrhosis with fatty liver over time.  Suspect this is due to her BMI.  Will screen her for diabetes.  Weight loss is recommended.  She is in discussion with her PCP about possibly starting a GLP-1 agonist although they have been linked to pancreatitis and would await some time to allow her to heal and further workup before starting that.  Encouraged routine coffee intake which she does liberally, will trend fatty liver and she is to be seen yearly for this issue.  No evidence of cirrhosis at this time.  Fib 4 score is low.  Otherwise evaluating rectal bleeding with colonoscopy at [facility] next month.  Case done at the hospital for anesthesia support given BMI.  She understands risk benefits of the procedure and further recommendations pending results.  This will also evaluate her altered bowel habits   PLAN: - will discuss  her case with my advanced endoscopy colleagues regarding possible EUS for recurrent pancreatitis - stop Creon - Diatherix swab -  C diff PCR - start colestid 1gm BID - 1 month trial with refill - labs for transaminitis workup  and check immunity to hep A and B - work on weight loss - consider GLP1 pending her course, but this can increase risk for pancreatitis, would hold for now - counseled on fatty liver - continue regular coffee intake - colonoscopy at [facility]  Christi Coward, MD [facility]

## 2023-11-03 NOTE — H&P (View-Only) (Signed)
 HPI :  43 year old female with a history of pancreatitis, rectal bleeding, here for a follow-up visit.  Recall she had a history of pancreatitis in December 2022 without a clear cause.  Unfortunately this past April she presented to the hospital with abdominal pain and found to have recurrent acute pancreatitis.  This was seen on a CT scan on April 16.  At the time of that exam her AST and ALT were normal along with normal alk phos and normal total bili.  Triglycerides were normal.  Incidentally her ALT and AST had risen slightly the 70s and 90s during that stay.  She has had a history of intermittently elevated liver enzymes.  She is status post cholecystectomy.  On the CT scan there was a question of possible masslike appearance in the uncinate process.  She had an MRCP done on April 17 which suggested this could be more related to sequela of acute pancreatitis although they could not necessarily exclude a mass lesion.  She had a CA 19-9 level that was normal.  She had a repeat CT of her pancreas on May 13 and there is no mass that is seen, it is normal.   She states her index symptoms or pain that started in her back and wraps around to her epigastric area.  Since her hospitalization and with time that has really gotten better.  She has chronic mid back pain which is stable.  She endorses 2 clear episodes of pancreatitis.  She had a cholecystectomy in 2018 before this started.  She has roughly 1-2 alcoholic beverages per month and does not drink on a routine basis.  She is not aware of any family history of pancreatic cancer or pancreatitis.  She denied any new medications around the time of this occurrence.  I see ashwaganda supplement listed on her medications although she states she stopped this last August.  She had been taking berberine which is not listed to be associated with pancreatitis.   She has had some altered bowel habits over the past few months since she was hospitalized.  She  states she has multiple loose stools per day, usually after eating.  She was started on Creon empirically and states that has not really helped her at all.  She denies any fat or grease in her stools.  She has 2 meals a day and is trying to eat less due to the diarrhea it causes.  She has had some night sweats at times.  Also of note she has hepatic steatosis noted on imaging.  Again she does not drink much of any alcohol. Her BMI is currently 53.2.  She states she gained about 50 pounds back in 2022, BMI has been over 50 lately.  We discussed fatty liver in general She has not had additional workup for elevated liver enzymes.     Of note she is scheduled to have a colonoscopy with me at the hospital next month for rectal bleeding.  She has had periodic rectal bleeding over time she is attributed this to hemorrhoids   PREVIOUS GI WORKUP    Ct ab/pelvis 04/2023 IMPRESSION: No acute findings or other significant abnormality. No radiographic evidence of urinary tract neoplasm, urolithiasis, or hydronephrosis.   CTAP w contrast 07/2021 IMPRESSION: 1. Small lymph nodes scattered within the central mesentery, upper abdomen and retroperitoneum, abnormal by number, but no enlarged lymph nodes present. This mild lymphadenopathy is a nonspecific finding but can be seen in the setting of a mesenteric adenitis  or possibly residual reactive lymphadenopathy related to the earlier reported episode of pancreatitis. Recommend follow-up CT in 3-6 months to ensure stability or resolution and exclude the possibility of this representing early lymphoma. Alternatively, could consider nonemergent PET-CT now for further characterization. 2. Remainder of the abdomen and pelvis CT is unremarkable. No bowel obstruction or evidence of bowel wall inflammation. No evidence of pancreatitis. No renal or ureteral calculi. Appendix is normal.   EGD 08/2021: upper abdominal pain - Mild, non- specific gastritis. Biopsied  to check for H. pylori.  - The examination was otherwise normal.   FINAL MICROSCOPIC DIAGNOSIS:   A.   STOMACH, BODY AND ANTRUM, BIOPSY:  -    Mild reactive gastropathy.  -    Negative for an inflammatory pattern predictive of Helicobacter  pylori infection.  -    Negative for intestinal metaplasia.  -    Negative for malignancy.      CT abdomen / pelvis 10/04/23: IMPRESSION: 1. Acute pancreatitis. Underlying developing mass of the uncinate process not excluded. When the patient is clinically stable and able to follow directions and hold their breath (preferably as an outpatient) further evaluation with dedicated MRI pancreatic protocol should be considered (after acute pancreatitis resolution). 2. Status post cholecystectomy.   MRCP 10/04/13: IMPRESSION: 1. Edema in and around the head of the pancreas extending into the pancreatico duodenal groove and along the descending and proximal transverse duodenum. Confluent smudgy T2 hyperintensity is identified in the uncinate process creating a "masslike" appearance similar to noted on recent CT. This region shows subtle hyper enhancement relative to background pancreatic parenchyma without evidence for restricted diffusion. Findings are likely related to sequelae of acute pancreatitis although underlying mass lesion is not excluded and continued close follow-up recommended. Endoscopic ultrasound may prove helpful to further evaluate. 2. No evidence for pseudocyst or abscess. 3. Hepatomegaly with hepatic steatosis. 4. Status post cholecystectomy. No intrahepatic or extrahepatic biliary dilation. MRCP imaging shows no evidence of choledocholithiasis.     RUQ US  10/11/23: IMPRESSION: Hepatic steatosis.  Status post cholecystectomy.   CT abdomen with contrast 10/31/23: PANCREAS: No peripancreatic fluid or inflammatory changes.  CA 19-9 - 11  Trigs 221    Past Medical History:  Diagnosis Date   Delivery of pregnancy by  cesarean section: Indication: arrest of dilation 11/26/2016   Hyperlipemia    Obesity    Pancreatitis    two episodes - 2022 and 2025 - unclear etiology   PCOS (polycystic ovarian syndrome)    Pneumonia    in her 20's   Prolonged first stage (of labor) 11/26/2016     Past Surgical History:  Procedure Laterality Date   BIOPSY  09/09/2021   Procedure: BIOPSY;  Surgeon: Janel Medford, MD;  Location: [facility];  Service: Endoscopy;;   CESAREAN SECTION N/A 11/26/2016   Procedure: CESAREAN SECTION;  Surgeon: Audelia Leaks, MD;  Location: [facility];  Service: Obstetrics;  Laterality: N/A;   CHOLECYSTECTOMY N/A 08/12/2014   Procedure: LAPAROSCOPIC CHOLECYSTECTOMY WITH INTRAOPERATIVE CHOLANGIOGRAM;  Surgeon: Fran Imus, MD;  Location: [facility];  Service: General;  Laterality: N/A;   ESOPHAGOGASTRODUODENOSCOPY (EGD) WITH PROPOFOL  N/A 09/09/2021   Procedure: ESOPHAGOGASTRODUODENOSCOPY (EGD) WITH PROPOFOL ;  Surgeon: Janel Medford, MD;  Location: [facility];  Service: Endoscopy;  Laterality: N/A;   TONSILLECTOMY AND ADENOIDECTOMY     WISDOM TOOTH EXTRACTION     Family History  Problem Relation Age of Onset   Heart disease Mother    Diverticulitis Mother  Seizures Sister    Diverticulitis Sister    Mental illness Paternal Grandmother    Social History   Tobacco Use   Smoking status: Former    Current packs/day: 0.00    Types: Cigarettes    Quit date: 07/30/2009    Years since quitting: 14.2   Smokeless tobacco: Never  Vaping Use   Vaping status: Never Used  Substance Use Topics   Alcohol use: Yes    Alcohol/week: 0.0 standard drinks of alcohol    Comment: OCCASIONAL   Drug use: No   Current Outpatient Medications  Medication Sig Dispense Refill   ALPRAZolam (XANAX) 0.25 MG tablet Take 0.25 mg by mouth 2 (two) times daily.     colestipol (COLESTID) 1 g tablet Take 1 tablet (1 g total) by mouth 2 (two) times daily. 60 tablet 1   ondansetron  (ZOFRAN -ODT) 4 MG  disintegrating tablet Take 1 tablet (4 mg total) by mouth every 8 (eight) hours as needed for nausea or vomiting. 20 tablet 0   oxyCODONE  (OXY IR/ROXICODONE ) 5 MG immediate release tablet Take 1 tablet (5 mg total) by mouth every 6 (six) hours as needed for moderate pain (pain score 4-6). 20 tablet 0   Red Yeast Rice Extract (RED YEAST RICE PO) Take 1,215 mg by mouth daily. Co Q 10     ASHWAGANDHA PO Take 1,000 mg by mouth daily. (Patient not taking: Reported on 11/03/2023)     Barberry-Oreg Grape-Goldenseal (BERBERINE COMPLEX PO) Take 200 mg by mouth daily. Turmeric (Patient not taking: Reported on 11/03/2023)     No current facility-administered medications for this visit.   No Known Allergies   Review of Systems: All systems reviewed and negative except where noted in HPI.    CT ABDOMEN W CONTRAST Result Date: 10/31/2023 EXAM: CT ABDOMEN WITH CONTRAST 10/31/2023 11:27:41 AM TECHNIQUE: CT of the abdomen was performed with the administration of intravenous contrast. Multiplanar reformatted images are provided for review. Automated exposure control, iterative reconstruction, and/or weight based adjustment of the mA/kV was utilized to reduce the radiation dose to as low as reasonably achievable. COMPARISON: 10/04/2023 CLINICAL HISTORY: RUQ abdominal pain, pancreatitis. [id number]; Recent pancreatitis. Still having troubles.; Pt states she keeps getting pancreatitis. FINDINGS: LOWER CHEST: Visualized portion of the lower chest demonstrates no acute abnormality. HEPATOBILIARY: Status post cholecystectomy. SPLEEN: Spleen demonstrates no acute abnormality. PANCREAS: No peripancreatic fluid or inflammatory changes. ADRENAL GLANDS: Adrenal glands demonstrate no acute abnormality. KIDNEYS: No evidence of stones in the kidneys or ureters. No evidence of hydronephrosis. No evidence of perinephric or periureteral stranding. GI AND BOWEL: Mild left colonic diverticulosis without evidence of diverticulitis.  Normal appendix (image 88). Stomach and duodenal sweep demonstrate no acute abnormality. There is no evidence of bowel obstruction. No evidence of abnormal bowel wall thickening or distension. PERITONEUM AND RETROPERITONEUM: No evidence of ascites or free air. Aorta is normal in caliber. LYMPH NODES: Small upper abdominal and retroperitoneal lymph nodes which do not meet pathologic CT size criteria. BONES AND SOFT TISSUES: No acute abnormality of the visualized bones. No focal soft tissue abnormality. IMPRESSION: 1. No CT evidence of pancreatitis. Electronically signed by: Zadie Herter MD 10/31/2023 10:15 PM EDT RP Workstation: [id number]   US  Abdomen Limited RUQ (LIVER/GB) Result Date: 10/11/2023 CLINICAL DATA:  RUQ pain, pancreatitis; R/O cholelithiasis EXAM: ULTRASOUND ABDOMEN LIMITED RIGHT UPPER QUADRANT COMPARISON:  October 05, 2023. FINDINGS: Gallbladder: Surgically absent Common bile duct: Not discretely visualized.  No intrahepatic biliary ductal dilation. Liver: No focal lesion identified. Diffusely  increased in parenchymal echogenicity with poor acoustic penetration. Portal vein is grossly patent on color Doppler imaging with normal direction of blood flow towards the liver. Other: None. IMPRESSION: Hepatic steatosis.  Status post cholecystectomy. Electronically Signed   By: Clancy Crimes M.D.   On: 10/11/2023 07:51   MR ABDOMEN MRCP W WO CONTAST Result Date: 10/05/2023 CLINICAL DATA:  Pancreatitis with possible developing uncinate process lesion. EXAM: MRI ABDOMEN WITHOUT AND WITH CONTRAST (INCLUDING MRCP) TECHNIQUE: Multiplanar multisequence MR imaging of the abdomen was performed both before and after the administration of intravenous contrast. Heavily T2-weighted images of the biliary and pancreatic ducts were obtained, and three-dimensional MRCP images were rendered by post processing. CONTRAST:  10mL GADAVIST  GADOBUTROL  1 MMOL/ML IV SOLN COMPARISON:  CT scan 10/04/2023 FINDINGS: Lower  chest: No acute findings. Hepatobiliary: Liver measures 21.3 cm craniocaudal length. Loss of signal intensity within the liver parenchyma diffusely on out of phase T1 imaging is consistent with fatty deposition. Although postcontrast imaging of the liver is motion degraded, no focal hepatic mass lesion evident. Gallbladder surgically absent. No intrahepatic or extrahepatic biliary dilation. Extrahepatic common bile duct measures 5 mm diameter in the head of the pancreas. MRCP imaging shows no evidence of choledocholithiasis. Pancreas: No main duct dilatation. There is edema in and around the head of the pancreas extending into the pancreatico duodenal groove and along the descending and proximal transverse duodenum. Confluent smudgy T2 hyperintensity is identified in the uncinate process as noted on recent CT. This region shows subtle hyper enhancement relative to background pancreatic parenchyma without evidence for restricted diffusion. Spleen:  No splenomegaly. No suspicious focal mass lesion. Adrenals/Urinary Tract: No adrenal nodule or mass. Kidneys unremarkable. Stomach/Bowel: Stomach is unremarkable. No gastric wall thickening. No evidence of outlet obstruction. Duodenum is normally positioned as is the ligament of Treitz. No small bowel or colonic dilatation within the visualized abdomen. Vascular/Lymphatic: No abdominal aortic aneurysm. The portal vein and superior mesenteric vein are patent. Splenic vein is patent. Upper normal lymph nodes are seen in the hepatoduodenal ligament. Other:  No substantial intraperitoneal free fluid. Musculoskeletal: No focal suspicious marrow enhancement within the visualized bony anatomy. IMPRESSION: 1. Edema in and around the head of the pancreas extending into the pancreatico duodenal groove and along the descending and proximal transverse duodenum. Confluent smudgy T2 hyperintensity is identified in the uncinate process creating a "masslike" appearance similar to noted on  recent CT. This region shows subtle hyper enhancement relative to background pancreatic parenchyma without evidence for restricted diffusion. Findings are likely related to sequelae of acute pancreatitis although underlying mass lesion is not excluded and continued close follow-up recommended. Endoscopic ultrasound may prove helpful to further evaluate. 2. No evidence for pseudocyst or abscess. 3. Hepatomegaly with hepatic steatosis. 4. Status post cholecystectomy. No intrahepatic or extrahepatic biliary dilation. MRCP imaging shows no evidence of choledocholithiasis. Electronically Signed   By: Donnal Fusi M.D.   On: 10/05/2023 12:34   MR 3D Recon At Scanner Result Date: 10/05/2023 CLINICAL DATA:  Pancreatitis with possible developing uncinate process lesion. EXAM: MRI ABDOMEN WITHOUT AND WITH CONTRAST (INCLUDING MRCP) TECHNIQUE: Multiplanar multisequence MR imaging of the abdomen was performed both before and after the administration of intravenous contrast. Heavily T2-weighted images of the biliary and pancreatic ducts were obtained, and three-dimensional MRCP images were rendered by post processing. CONTRAST:  10mL GADAVIST  GADOBUTROL  1 MMOL/ML IV SOLN COMPARISON:  CT scan 10/04/2023 FINDINGS: Lower chest: No acute findings. Hepatobiliary: Liver measures 21.3 cm craniocaudal length. Loss of  signal intensity within the liver parenchyma diffusely on out of phase T1 imaging is consistent with fatty deposition. Although postcontrast imaging of the liver is motion degraded, no focal hepatic mass lesion evident. Gallbladder surgically absent. No intrahepatic or extrahepatic biliary dilation. Extrahepatic common bile duct measures 5 mm diameter in the head of the pancreas. MRCP imaging shows no evidence of choledocholithiasis. Pancreas: No main duct dilatation. There is edema in and around the head of the pancreas extending into the pancreatico duodenal groove and along the descending and proximal transverse  duodenum. Confluent smudgy T2 hyperintensity is identified in the uncinate process as noted on recent CT. This region shows subtle hyper enhancement relative to background pancreatic parenchyma without evidence for restricted diffusion. Spleen:  No splenomegaly. No suspicious focal mass lesion. Adrenals/Urinary Tract: No adrenal nodule or mass. Kidneys unremarkable. Stomach/Bowel: Stomach is unremarkable. No gastric wall thickening. No evidence of outlet obstruction. Duodenum is normally positioned as is the ligament of Treitz. No small bowel or colonic dilatation within the visualized abdomen. Vascular/Lymphatic: No abdominal aortic aneurysm. The portal vein and superior mesenteric vein are patent. Splenic vein is patent. Upper normal lymph nodes are seen in the hepatoduodenal ligament. Other:  No substantial intraperitoneal free fluid. Musculoskeletal: No focal suspicious marrow enhancement within the visualized bony anatomy. IMPRESSION: 1. Edema in and around the head of the pancreas extending into the pancreatico duodenal groove and along the descending and proximal transverse duodenum. Confluent smudgy T2 hyperintensity is identified in the uncinate process creating a "masslike" appearance similar to noted on recent CT. This region shows subtle hyper enhancement relative to background pancreatic parenchyma without evidence for restricted diffusion. Findings are likely related to sequelae of acute pancreatitis although underlying mass lesion is not excluded and continued close follow-up recommended. Endoscopic ultrasound may prove helpful to further evaluate. 2. No evidence for pseudocyst or abscess. 3. Hepatomegaly with hepatic steatosis. 4. Status post cholecystectomy. No intrahepatic or extrahepatic biliary dilation. MRCP imaging shows no evidence of choledocholithiasis. Electronically Signed   By: Donnal Fusi M.D.   On: 10/05/2023 12:34   CT ABDOMEN PELVIS W CONTRAST Result Date: 10/04/2023 CLINICAL  DATA:  Abdominal pain, acute, nonlocalized Abdominal/flank pain, stone suspected EXAM: CT ABDOMEN AND PELVIS WITH CONTRAST TECHNIQUE: Multidetector CT imaging of the abdomen and pelvis was performed using the standard protocol following bolus administration of intravenous contrast. RADIATION DOSE REDUCTION: This exam was performed according to the departmental dose-optimization program which includes automated exposure control, adjustment of the mA and/or kV according to patient size and/or use of iterative reconstruction technique. CONTRAST:  OMNIPAQUE  IOHEXOL  300 MG/ML  SOLN COMPARISON:  CT abdomen pelvis 07/23/2021, CT abdomen pelvis 05/09/2023 FINDINGS: Lower chest: No acute abnormality. Hepatobiliary: No focal liver abnormality. Status post cholecystectomy. No biliary dilatation. Pancreas: Mildly atrophic. Hazy contour of the uncinate process with slight masslike appearance. Associated mild fat stranding along the uncinate process. No main pancreatic ductal dilatation. No pseudocyst formation. Spleen: Normal in size without focal abnormality. Adrenals/Urinary Tract: No adrenal nodule bilaterally. Bilateral kidneys enhance symmetrically. No hydronephrosis. No hydroureter. The urinary bladder is unremarkable. Stomach/Bowel: Stomach is within normal limits. No evidence of bowel wall thickening or dilatation. Colonic diverticulosis. Appendix appears normal. Vascular/Lymphatic: No abdominal aorta or iliac aneurysm. Mild atherosclerotic plaque of the aorta and its branches. Persistent prominent but nonenlarged retroperitoneal and mesenteric lymph nodes. No abdominal, pelvic, or inguinal lymphadenopathy. Reproductive: Uterus and bilateral adnexa are unremarkable. Other: No intraperitoneal free fluid. No intraperitoneal free gas. No organized fluid collection.  Musculoskeletal: No abdominal wall hernia or abnormality. No suspicious lytic or blastic osseous lesions. No acute displaced fracture. IMPRESSION: 1. Acute  pancreatitis. Underlying developing mass of the uncinate process not excluded. When the patient is clinically stable and able to follow directions and hold their breath (preferably as an outpatient) further evaluation with dedicated MRI pancreatic protocol should be considered (after acute pancreatitis resolution). 2. Status post cholecystectomy. Electronically Signed   By: Morgane  Naveau M.D.   On: 10/04/2023 20:23   Lab Results  Component Value Date   WBC 7.8 10/06/2023   HGB 12.1 10/06/2023   HCT 38.3 10/06/2023   MCV 86.7 10/06/2023   PLT 289 10/06/2023    Lab Results  Component Value Date   NA 139 10/07/2023   CL 104 10/07/2023   K 4.4 10/07/2023   CO2 27 10/07/2023   BUN 10 10/07/2023   CREATININE 0.79 10/07/2023   GFRNONAA >60 10/07/2023   CALCIUM 8.8 (L) 10/07/2023   PHOS 3.2 10/06/2023   ALBUMIN 3.5 10/07/2023   GLUCOSE 96 10/07/2023    Lab Results  Component Value Date   ALT 90 (H) 10/07/2023   AST 70 (H) 10/07/2023   ALKPHOS 67 10/07/2023   BILITOT 0.6 10/07/2023    Fibrosis 4 Score = 1.05 (Low risk)        Interpretation for patients with NAFLD          <1.30       -  F0-F1 (Low risk)          1.30-2.67 -  Indeterminate           >2.67      -  F3-F4 (High risk)     Validated for ages 60-65         Physical Exam: BP 110/70   Pulse 94   Ht 5\' 4"  (1.626 m)   Wt (!) 310 lb (140.6 kg)   LMP 10/13/2023   BMI 53.21 kg/m  Constitutional: Pleasant,well-developed, female in no acute distress. Neurological: Alert and oriented to person place and time. Skin: Skin is warm and dry. No rashes noted. Psychiatric: Normal mood and affect. Behavior is normal.   ASSESSMENT: 43 y.o. female here for assessment of the following  1. History of pancreatitis   2. Altered bowel habits   3. Transaminitis   4. Fatty liver   5. Morbid obesity (HCC)    Multiple issues addressed during the visit today.  She has had now 2 episodes of pancreatitis over the past few  years, unclear etiology.  She is status post cholecystectomy, liver enzymes were normal at the time of pancreatitis however she does have a baseline mild transaminitis.  No new medications to cause this.  Follow-up imaging showed no obvious mass lesion in regards to question of this on her initial CT scan.  Not sure what is driving this process, will discuss with Dr. Brice Campi if he wanted to consider EUS for her given this recurrence.  She has clearly had some altered bowel habits and diarrhea since the hospitalization.  Creon not helping at all so we will stop that.  Will send Diatherix stool swab for C. difficile to ensure she did not pick that up at the hospital.  Otherwise we will start colestipol 1 g twice daily for 1 month trial and see if that will help in the setting of her postcholecystectomy state.  We discussed her transaminitis, very well could be due to fatty liver, but will send serologic workup to  rule out other causes and check immunity to hepatitis A and B.  I will repeat her LFTs today to see where this is trending.  We discussed risk for fibrosis and cirrhosis with fatty liver over time.  Suspect this is due to her BMI.  Will screen her for diabetes.  Weight loss is recommended.  She is in discussion with her PCP about possibly starting a GLP-1 agonist although they have been linked to pancreatitis and would await some time to allow her to heal and further workup before starting that.  Encouraged routine coffee intake which she does liberally, will trend fatty liver and she is to be seen yearly for this issue.  No evidence of cirrhosis at this time.  Fib 4 score is low.  Otherwise evaluating rectal bleeding with colonoscopy at [facility] next month.  Case done at the hospital for anesthesia support given BMI.  She understands risk benefits of the procedure and further recommendations pending results.  This will also evaluate her altered bowel habits   PLAN: - will discuss  her case with my advanced endoscopy colleagues regarding possible EUS for recurrent pancreatitis - stop Creon - Diatherix swab -  C diff PCR - start colestid 1gm BID - 1 month trial with refill - labs for transaminitis workup  and check immunity to hep A and B - work on weight loss - consider GLP1 pending her course, but this can increase risk for pancreatitis, would hold for now - counseled on fatty liver - continue regular coffee intake - colonoscopy at [facility]  Christi Coward, MD [facility]

## 2023-11-03 NOTE — Patient Instructions (Addendum)
 You have been scheduled for a colonoscopy. Please follow written instructions given to you at your visit today.   If you use inhalers (even only as needed), please bring them with you on the day of your procedure.  DO NOT TAKE 7 DAYS PRIOR TO TEST- Trulicity (dulaglutide) Ozempic, Wegovy (semaglutide) Mounjaro (tirzepatide) Bydureon Bcise (exanatide extended release)  DO NOT TAKE 1 DAY PRIOR TO YOUR TEST Rybelsus (semaglutide) Adlyxin (lixisenatide) Victoza (liraglutide) Byetta (exanatide) ___________________________________________________________________________  Your provider has ordered "Diatherix" stool testing for you. You have received a kit from our office today containing all necessary supplies to complete this test. Please carefully read the stool collection instructions provided in the kit before opening the accompanying materials. In addition, be sure there is a label providing your full name and date of birth on the "puritan opti-swab" tube that is supplied in the kit (if you do not see a label with this information on your test tube, please make us  aware before test collection!). After completing the test, you should secure the purtian tube into the specimen biohazard bag. The [facility] E-Req sheet (including date and time of specimen collection) should be placed into the outside pocket of the specimen biohazard bag and returned to the [place] lab (basement floor of [facility]) within 3 days of collection. Please make sure to give the specimen to a staff member at the lab. DO NOT leave the specimen on the counter.  If the specimen date and time (can be found in the upper right boxed portion of the sheet) are not filled out on the E-Req sheet, the test will NOT be performed.    Please go to the lab in the basement of our building to have lab work done as you leave today. Hit "B" for basement when you get on the elevator.  When the doors open the lab  is on your left.  We will call you with the results. Thank you.  Stop Creon  We have sent the following medications to your pharmacy for you to pick up at your convenience: Colestid: Take twice a day  Thank you for entrusting me with your care and for choosing [facility], Dr. Alvester Johnson    If your blood pressure at your visit was 140/90 or greater, please contact your primary care physician to follow up on this. ______________________________________________________  If you are age 5 or older, your body mass index should be between 23-30. Your Body mass index is 53.21 kg/m. If this is out of the aforementioned range listed, please consider follow up with your Primary Care Provider.  If you are age 65 or younger, your body mass index should be between 19-25. Your Body mass index is 53.21 kg/m. If this is out of the aformentioned range listed, please consider follow up with your Primary Care Provider.  ________________________________________________________  The [facility] providers would like to encourage you to use MYCHART to communicate with providers for non-urgent requests or questions.  Due to long hold times on the telephone, sending your provider a message by [facility] may be a faster and more efficient way to get a response.  Please allow 48 business hours for a response.  Please remember that this is for non-urgent requests.  _______________________________________________________  Due to recent changes in healthcare laws, you may see the results of your imaging and laboratory studies on MyChart before your provider has had a chance to review them.  We understand that in some cases there  may be results that are confusing or concerning to you. Not all laboratory results come back in the same time frame and the provider may be waiting for multiple results in order to interpret others.  Please give us  48 hours in order for your provider to thoroughly review all the  results before contacting the office for clarification of your results.

## 2023-11-06 ENCOUNTER — Telehealth: Payer: Self-pay

## 2023-11-06 ENCOUNTER — Other Ambulatory Visit: Payer: Self-pay

## 2023-11-06 DIAGNOSIS — K8689 Other specified diseases of pancreas: Secondary | ICD-10-CM

## 2023-11-06 DIAGNOSIS — Z8719 Personal history of other diseases of the digestive system: Secondary | ICD-10-CM

## 2023-11-06 NOTE — Telephone Encounter (Signed)
-----   Message from Ace Holder sent at 11/06/2023  1:12 PM EDT ----- Regarding: RE: possible EUS - nonurgent Thanks very much Gabe.  I just got off the phone with her, she very much wants to proceed with the EUS after discussion of what it is, risks, etc.  Evin Loiseau can you please schedule EUS for her with GM.   ANA already done and negative.  We can do the IgG4.  Tryniti Laatsch can you also order IgG4, I told the patient and asked her to come to the lab at her convenience.  Thanks  SA ----- Message ----- From: Normie Becton., MD Sent: 11/04/2023   7:35 AM EDT To: Ace Holder, MD Subject: RE: possible EUS - nonurgent                   SA, I think EUS is reasonable. Would also get IgG4 and ANA. Let me know if you want me to send to Beniah Magnan. GM ----- Message ----- From: Ace Holder, MD Sent: 11/03/2023  12:14 PM EDT To: Normie Becton., MD Subject: possible EUS - nonurgent                       Hey Gabe, Curious to see if you think this patient needs an EUS.  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  Initial CT was concerning for possible pancreatic mass but follow-up imaging does not show that, CA 19-9 normal.  Unclear why she has gotten this but not sure if you think it would warrant an EUS to further evaluate.  Thanks for your opinion.  Siegfried Dress

## 2023-11-06 NOTE — Telephone Encounter (Signed)
 EUS has been entered for 01/17/24 at 930 am at [facility] with GM

## 2023-11-06 NOTE — Telephone Encounter (Signed)
 EUS scheduled, pt instructed and medications reviewed.  Patient instructions mailed to home.  Patient to call with any questions or concerns.

## 2023-11-07 ENCOUNTER — Encounter: Payer: Self-pay | Admitting: Gastroenterology

## 2023-11-07 LAB — TISSUE TRANSGLUTAMINASE, IGA: (tTG) Ab, IgA: <1 U/mL

## 2023-11-07 LAB — HEPATITIS B SURFACE ANTIGEN: Hepatitis B Surface Ag: NONREACTIVE

## 2023-11-07 LAB — ALPHA-1-ANTITRYPSIN: A-1 Antitrypsin, Ser: 138 mg/dL (ref 83–199)

## 2023-11-07 LAB — HEPATITIS B SURFACE ANTIBODY,QUALITATIVE: Hep B S Ab: NONREACTIVE

## 2023-11-07 LAB — HEMOGLOBIN A1C
Hgb A1c MFr Bld: 5.7 % — ABNORMAL HIGH (ref <5.7)
Mean Plasma Glucose: 117 mg/dL
eAG (mmol/L): 6.5 mmol/L

## 2023-11-07 LAB — IGA: Immunoglobulin A: 203 mg/dL (ref 47–310)

## 2023-11-07 LAB — CERULOPLASMIN: Ceruloplasmin: 27 mg/dL (ref 14–48)

## 2023-11-07 LAB — IGG: IgG (Immunoglobin G), Serum: 1145 mg/dL (ref 600–1640)

## 2023-11-07 LAB — ANA: Anti Nuclear Antibody (ANA): NEGATIVE

## 2023-11-07 LAB — HEPATITIS C ANTIBODY: Hepatitis C Ab: NONREACTIVE

## 2023-11-07 LAB — HEPATITIS A ANTIBODY, TOTAL: Hepatitis A AB,Total: NONREACTIVE

## 2023-11-07 LAB — ANTI-SMOOTH MUSCLE ANTIBODY, IGG: Actin (Smooth Muscle) Antibody (IGG): <20 U (ref <20)

## 2023-11-08 ENCOUNTER — Ambulatory Visit: Payer: Self-pay | Admitting: Gastroenterology

## 2023-11-08 ENCOUNTER — Other Ambulatory Visit (INDEPENDENT_AMBULATORY_CARE_PROVIDER_SITE_OTHER)

## 2023-11-08 DIAGNOSIS — K859 Acute pancreatitis without necrosis or infection, unspecified: Secondary | ICD-10-CM

## 2023-11-08 DIAGNOSIS — K760 Fatty (change of) liver, not elsewhere classified: Secondary | ICD-10-CM | POA: Diagnosis not present

## 2023-11-08 DIAGNOSIS — R7401 Elevation of levels of liver transaminase levels: Secondary | ICD-10-CM | POA: Diagnosis not present

## 2023-11-08 DIAGNOSIS — Z8719 Personal history of other diseases of the digestive system: Secondary | ICD-10-CM

## 2023-11-08 LAB — HEPATIC FUNCTION PANEL
ALT: 18 U/L (ref 0–35)
AST: 16 U/L (ref 0–37)
Albumin: 4.4 g/dL (ref 3.5–5.2)
Alkaline Phosphatase: 73 U/L (ref 39–117)
Bilirubin, Direct: 0 mg/dL (ref 0.0–0.3)
Total Bilirubin: 0.4 mg/dL (ref 0.2–1.2)
Total Protein: 7.6 g/dL (ref 6.0–8.3)

## 2023-11-09 LAB — IGG 4: IgG, Subclass 4: 23 mg/dL (ref 2–96)

## 2023-11-15 ENCOUNTER — Telehealth: Payer: Self-pay | Admitting: Gastroenterology

## 2023-11-15 NOTE — Telephone Encounter (Signed)
 Procedure:Colonoscopy Procedure date: 11/23/23 Procedure location: [facility] Arrival Time: 8:45 am Spoke with the patient Y/N:   No, I left a detailed message 11/15/23 @ 10:08 am for the patient to return call    Any prep concerns? ___  Has the patient obtained the prep from the pharmacy ? ___ Do you have a care partner and transportation: ___ Any additional concerns? ___

## 2023-11-15 NOTE — Telephone Encounter (Signed)
 Called the patient back and let her know that it was ok to take those medications. The only medication we require you not to take are diabetic medications and blood thinners. Told the patient that she would also get a call from the hospital about their concerns and requirements. So she should ask them about the medications.

## 2023-11-15 NOTE — Telephone Encounter (Signed)
 Patient called and stated that she was returning a call back to [facility]. Patient stated that she is aware of procedure for June the 5th at the [facility] and her arrival time is 8:45 AM. Patient did state that she would like to know if she is able to take her Colestipol  and Xanax and when she would need to stop it. Patient stated that she doe shave her prep medication and her care partner along with transportation. No additional concern. Patient did stated that she would like a call back to discuss her medication Colestipol  and Xanax. Patient stated that it is ok to leave a detailed VM. Please advise.

## 2023-11-16 ENCOUNTER — Encounter (HOSPITAL_COMMUNITY): Payer: Self-pay | Admitting: Gastroenterology

## 2023-11-17 ENCOUNTER — Other Ambulatory Visit: Payer: Self-pay | Admitting: Family Medicine

## 2023-11-17 ENCOUNTER — Ambulatory Visit
Admission: RE | Admit: 2023-11-17 | Discharge: 2023-11-17 | Disposition: A | Discharge status: Home / Self Care | Source: Ambulatory Visit | Attending: Family Medicine | Admitting: Family Medicine

## 2023-11-17 DIAGNOSIS — R609 Edema, unspecified: Secondary | ICD-10-CM

## 2023-11-23 ENCOUNTER — Telehealth: Payer: Self-pay | Admitting: *Deleted

## 2023-11-23 ENCOUNTER — Ambulatory Visit (HOSPITAL_COMMUNITY): Admitting: Anesthesiology

## 2023-11-23 ENCOUNTER — Ambulatory Visit (HOSPITAL_COMMUNITY)
Admission: RE | Admit: 2023-11-23 | Discharge: 2023-11-23 | Disposition: A | Discharge status: Home / Self Care | Attending: Gastroenterology | Admitting: Gastroenterology

## 2023-11-23 ENCOUNTER — Encounter (HOSPITAL_COMMUNITY)
Admission: RE | Disposition: A | Discharge status: Home / Self Care | Payer: Self-pay | Source: Home / Self Care | Attending: Gastroenterology

## 2023-11-23 ENCOUNTER — Encounter (HOSPITAL_COMMUNITY): Payer: Self-pay | Admitting: Gastroenterology

## 2023-11-23 ENCOUNTER — Other Ambulatory Visit: Payer: Self-pay

## 2023-11-23 DIAGNOSIS — R1012 Left upper quadrant pain: Secondary | ICD-10-CM

## 2023-11-23 DIAGNOSIS — D128 Benign neoplasm of rectum: Secondary | ICD-10-CM | POA: Diagnosis not present

## 2023-11-23 DIAGNOSIS — K648 Other hemorrhoids: Secondary | ICD-10-CM | POA: Diagnosis not present

## 2023-11-23 DIAGNOSIS — R109 Unspecified abdominal pain: Secondary | ICD-10-CM

## 2023-11-23 DIAGNOSIS — E6689 Other obesity not elsewhere classified: Secondary | ICD-10-CM | POA: Insufficient documentation

## 2023-11-23 DIAGNOSIS — K921 Melena: Secondary | ICD-10-CM

## 2023-11-23 DIAGNOSIS — K621 Rectal polyp: Secondary | ICD-10-CM | POA: Insufficient documentation

## 2023-11-23 DIAGNOSIS — K5730 Diverticulosis of large intestine without perforation or abscess without bleeding: Secondary | ICD-10-CM

## 2023-11-23 DIAGNOSIS — K625 Hemorrhage of anus and rectum: Secondary | ICD-10-CM

## 2023-11-23 DIAGNOSIS — K644 Residual hemorrhoidal skin tags: Secondary | ICD-10-CM

## 2023-11-23 DIAGNOSIS — K649 Unspecified hemorrhoids: Secondary | ICD-10-CM

## 2023-11-23 DIAGNOSIS — D126 Benign neoplasm of colon, unspecified: Secondary | ICD-10-CM

## 2023-11-23 DIAGNOSIS — Z6841 Body Mass Index (BMI) 40.0 and over, adult: Secondary | ICD-10-CM | POA: Diagnosis not present

## 2023-11-23 DIAGNOSIS — K859 Acute pancreatitis without necrosis or infection, unspecified: Secondary | ICD-10-CM | POA: Insufficient documentation

## 2023-11-23 DIAGNOSIS — Z87891 Personal history of nicotine dependence: Secondary | ICD-10-CM | POA: Insufficient documentation

## 2023-11-23 DIAGNOSIS — I700 Atherosclerosis of aorta: Secondary | ICD-10-CM | POA: Diagnosis not present

## 2023-11-23 DIAGNOSIS — Z79899 Other long term (current) drug therapy: Secondary | ICD-10-CM | POA: Insufficient documentation

## 2023-11-23 DIAGNOSIS — K2970 Gastritis, unspecified, without bleeding: Secondary | ICD-10-CM | POA: Insufficient documentation

## 2023-11-23 DIAGNOSIS — K760 Fatty (change of) liver, not elsewhere classified: Secondary | ICD-10-CM | POA: Insufficient documentation

## 2023-11-23 HISTORY — PX: COLONOSCOPY WITH PROPOFOL: SHX5780

## 2023-11-23 LAB — PREGNANCY, URINE: Preg Test, Ur: NEGATIVE

## 2023-11-23 SURGERY — COLONOSCOPY WITH PROPOFOL
Anesthesia: Monitor Anesthesia Care

## 2023-11-23 MED ORDER — PROPOFOL 500 MG/50ML IV EMUL
INTRAVENOUS | Status: DC | PRN
  Administered 2023-11-23: 150 ug/kg/min via INTRAVENOUS

## 2023-11-23 MED ORDER — PROPOFOL 10 MG/ML IV BOLUS
INTRAVENOUS | Status: DC | PRN
  Administered 2023-11-23: 30 mg via INTRAVENOUS
  Administered 2023-11-23: 20 mg via INTRAVENOUS

## 2023-11-23 MED ORDER — LIDOCAINE 2% (20 MG/ML) 5 ML SYRINGE
INTRAMUSCULAR | Status: DC | PRN
  Administered 2023-11-23: 80 mg via INTRAVENOUS

## 2023-11-23 MED ORDER — PROPOFOL 1000 MG/100ML IV EMUL
INTRAVENOUS | Status: AC
  Filled 2023-11-23: qty 100

## 2023-11-23 MED ORDER — SODIUM CHLORIDE 0.9 % IV SOLN: INTRAVENOUS | Status: DC

## 2023-11-23 SURGICAL SUPPLY — 17 items
ELECTRODE REM PT RTRN 9FT ADLT (ELECTROSURGICAL) IMPLANT
FORCEPS BIOP RAD 4 LRG CAP 4 (CUTTING FORCEPS) IMPLANT
FORCEPS BXJMBJMB 240X2.8X (CUTTING FORCEPS) IMPLANT
INJECTOR/SNARE I SNARE (MISCELLANEOUS) IMPLANT
LUBRICANT JELLY 4.5OZ STERILE (MISCELLANEOUS) IMPLANT
MANIFOLD NEPTUNE II (INSTRUMENTS) IMPLANT
NDL SCLEROTHERAPY 25GX240 (NEEDLE) IMPLANT
NEEDLE SCLEROTHERAPY 25GX240 (NEEDLE) IMPLANT
PAD FLOOR 36X40 (MISCELLANEOUS) ×1 IMPLANT
PROBE APC STR FIRE (PROBE) IMPLANT
PROBE INJECTION GOLD 7FR (MISCELLANEOUS) IMPLANT
SNARE ROTATE MED OVAL 20MM (MISCELLANEOUS) IMPLANT
SYR 50ML LL SCALE MARK (SYRINGE) IMPLANT
TRAP SPECIMEN MUCOUS 40CC (MISCELLANEOUS) IMPLANT
TUBING ENDO SMARTCAP PENTAX (MISCELLANEOUS) IMPLANT
TUBING IRRIGATION ENDOGATOR (MISCELLANEOUS) ×1 IMPLANT
WATER STERILE IRR 1000ML POUR (IV SOLUTION) IMPLANT

## 2023-11-23 NOTE — Interval H&P Note (Signed)
 History and Physical Interval Note: Patient at the hospital for colonoscopy to evaluate rectal bleeding. She has never had a colonoscopy before. Due to BMI > 50, her case is being done at the hospital for anesthesia support. She is otherwise feeling well today without complaints. I have discussed risks / benefits of the exam and anesthesia with her, and she wishes to proceed. No interval changes since the office visit.   11/23/2023 9:01 AM  Nonnie Beagle L Raburn   has presented today for surgery, with the diagnosis of Blood in stool Hemorrhoids LUQ pain Left flank pain.  The various methods of treatment have been discussed with the patient and family. After consideration of risks, benefits and other options for treatment, the patient has consented to  Procedure(s): COLONOSCOPY WITH PROPOFOL  (N/A) as a surgical intervention.  The patient's history has been reviewed, patient examined, no change in status, stable for surgery.  I have reviewed the patient's chart and labs.  Questions were answered to the patient's satisfaction.     Landon Pinion P Jhade Berko

## 2023-11-23 NOTE — Anesthesia Preprocedure Evaluation (Addendum)
 Anesthesia Evaluation  Patient identified by MRN, date of birth, ID band Patient awake    Reviewed: Allergy & Precautions, NPO status , Patient's Chart, lab work & pertinent test results, reviewed documented beta blocker date and time   History of Anesthesia Complications Negative for: history of anesthetic complications  Airway Mallampati: III  TM Distance: >3 FB     Dental no notable dental hx.    Pulmonary neg shortness of breath, pneumonia, neg COPD, former smoker   breath sounds clear to auscultation       Cardiovascular (-) angina (-) CAD and (-) Past MI negative cardio ROS  Rhythm:Regular Rate:Normal     Neuro/Psych neg Seizures    GI/Hepatic ,,,(+) neg Cirrhosis      Recurrent pancreatitis   Endo/Other    Class 4 obesity  Renal/GU Renal disease     Musculoskeletal   Abdominal   Peds  Hematology   Anesthesia Other Findings   Reproductive/Obstetrics                              Anesthesia Physical Anesthesia Plan  ASA: 3  Anesthesia Plan: MAC   Post-op Pain Management:    Induction: Intravenous  PONV Risk Score and Plan: 1 and Ondansetron  and Propofol  infusion  Airway Management Planned: Natural Airway and Nasal Cannula  Additional Equipment:   Intra-op Plan:   Post-operative Plan:   Informed Consent: I have reviewed the patients History and Physical, chart, labs and discussed the procedure including the risks, benefits and alternatives for the proposed anesthesia with the patient or authorized representative who has indicated his/her understanding and acceptance.       Plan Discussed with: CRNA  Anesthesia Plan Comments:          Anesthesia Quick Evaluation

## 2023-11-23 NOTE — Op Note (Signed)
 [facility] Patient Name: Alexis Howell  Procedure Date: 11/23/2023 MRN: [id number] Attending MD: Lendon Queen. General Kenner , MD, [id number] Date of Birth: February 24, 1981 CSN: [id number] Age: 43 Admit Type: Outpatient Procedure:                Colonoscopy Indications:              Rectal bleeding - first time colonoscopy Providers:                Landon Pinion P. General Kenner, MD, Suzann Ernst, RN, Joline Ned, Technician Referring MD:              Medicines:                Monitored Anesthesia Care Complications:            No immediate complications. Estimated blood loss:                            Minimal. Estimated Blood Loss:     Estimated blood loss was minimal. Procedure:                Pre-Anesthesia Assessment:                           - Prior to the procedure, a History and Physical                            was performed, and patient medications and                            allergies were reviewed. The patient's tolerance of                            previous anesthesia was also reviewed. The risks                            and benefits of the procedure and the sedation                            options and risks were discussed with the patient.                            All questions were answered, and informed consent                            was obtained. Prior Anticoagulants: The patient has                            taken no anticoagulant or antiplatelet agents. ASA                            Grade Assessment: III - A patient with severe  systemic disease. After reviewing the risks and                            benefits, the patient was deemed in satisfactory                            condition to undergo the procedure.                           After obtaining informed consent, the colonoscope                            was passed under direct vision. Throughout the                             procedure, the patient's blood pressure, pulse, and                            oxygen saturations were monitored continuously. The                            CF-HQ190L ([id number]) Olympus colonoscope was                            introduced through the anus and advanced to the the                            terminal ileum, with identification of the                            appendiceal orifice and IC valve. The colonoscopy                            was performed without difficulty. The patient                            tolerated the procedure well. The quality of the                            bowel preparation was good. The terminal ileum,                            ileocecal valve, appendiceal orifice, and rectum                            were photographed. Scope In: 10:04:28 AM Scope Out: 10:22:57 AM Scope Withdrawal Time: 0 hours 14 minutes 28 seconds  Total Procedure Duration: 0 hours 18 minutes 29 seconds  Findings:      Skin tag was found on perianal exam.      The terminal ileum appeared normal.      Multiple small-mouthed diverticula were found in the sigmoid colon.      A 3 mm polyp was found in the rectum. The polyp was sessile. The polyp       was removed with  a cold snare. Resection and retrieval were complete.      Internal hemorrhoids were found during retroflexion.      The exam was otherwise without abnormality. Impression:               - Perianal skin tag found on perianal exam.                           - The examined portion of the ileum was normal.                           - Diverticulosis in the sigmoid colon.                           - One 3 mm polyp in the rectum, removed with a cold                            snare. Resected and retrieved.                           - Internal hemorrhoids.                           - The examination was otherwise normal.                           Hemorrhoids are the cause of rectal bleeding. Moderate Sedation:      No  moderate sedation, case performed with MAC Recommendation:           - Patient has a contact number available for                            emergencies. The signs and symptoms of potential                            delayed complications were discussed with the                            patient. Return to normal activities tomorrow.                            Written discharge instructions were provided to the                            patient.                           - Resume previous diet.                           - Continue present medications.                           - Continue colestid  if that has helped loose stools.                           -  Await pathology results.                           - Consideration for hemorrhoid banding if bleeding                            symptoms persist Procedure Code(s):        --- Professional ---                           [phone], Colonoscopy, flexible; with removal of                            tumor(s), polyp(s), or other lesion(s) by snare                            technique Diagnosis Code(s):        --- Professional ---                           K64.8, Other hemorrhoids                           D12.8, Benign neoplasm of rectum                           K64.4, Residual hemorrhoidal skin tags                           K62.5, Hemorrhage of anus and rectum                           K57.30, Diverticulosis of large intestine without                            perforation or abscess without bleeding CPT copyright 2022 American Medical Association. All rights reserved. The codes documented in this report are preliminary and upon coder review may  be revised to meet current compliance requirements. [name], MD 11/23/2023 10:33:19 AM This report has been signed electronically. Number of Addenda: 0

## 2023-11-23 NOTE — Discharge Instructions (Signed)

## 2023-11-23 NOTE — Telephone Encounter (Signed)
 Patient has been scheduled for hemorrhoidal banding #1 on 02/06/24 at 4 pm. MyChart message sent to patient.

## 2023-11-23 NOTE — Anesthesia Postprocedure Evaluation (Signed)
 Anesthesia Post Note  Patient: Alexis Howell   Procedure(s) Performed: COLONOSCOPY WITH PROPOFOL      Patient location during evaluation: PACU Anesthesia Type: MAC Level of consciousness: awake and alert Pain management: pain level controlled Vital Signs Assessment: post-procedure vital signs reviewed and stable Respiratory status: spontaneous breathing, nonlabored ventilation, respiratory function stable and patient connected to nasal cannula oxygen Cardiovascular status: stable and blood pressure returned to baseline Postop Assessment: no apparent nausea or vomiting Anesthetic complications: no   No notable events documented.  Last Vitals:  Vitals:   11/23/23 1050 11/23/23 1052  BP: 121/64 121/64  Pulse: 88 85  Resp: 18 (!) 29  Temp:    SpO2: 98% 100%    Last Pain:  Vitals:   11/23/23 1052  TempSrc:   PainSc: 0-No pain                 Leslye Rast

## 2023-11-23 NOTE — Transfer of Care (Signed)
 Immediate Anesthesia Transfer of Care Note  Patient: Carie Kapuscinski Arment   Procedure(s) Performed: COLONOSCOPY WITH PROPOFOL   Patient Location: PACU  Anesthesia Type:MAC  Level of Consciousness: awake, alert , and oriented  Airway & Oxygen Therapy: Patient Spontanous Breathing and Patient connected to face mask oxygen  Post-op Assessment: Report given to RN and Post -op Vital signs reviewed and stable  Post vital signs: Reviewed and stable  Last Vitals:  Vitals Value Taken Time  BP    Temp    Pulse    Resp 29 11/23/23 1033  SpO2    Vitals shown include unfiled device data.  Last Pain:  Vitals:   11/23/23 0907  TempSrc: Temporal  PainSc: 2       Patients Stated Pain Goal: 4 (11/23/23 0907)  Complications: No notable events documented.

## 2023-11-23 NOTE — Telephone Encounter (Signed)
-----   Message from Ace Holder sent at 11/23/2023 10:39 AM EDT ----- Regarding: another hem banding Can someone please help set this patient up with me for a routine hem banding? Thanks

## 2023-11-24 LAB — SURGICAL PATHOLOGY

## 2023-11-25 ENCOUNTER — Ambulatory Visit: Payer: Self-pay | Admitting: Gastroenterology

## 2023-11-27 ENCOUNTER — Encounter (HOSPITAL_COMMUNITY): Payer: Self-pay | Admitting: Gastroenterology

## 2023-12-12 ENCOUNTER — Encounter: Payer: Self-pay | Admitting: Gastroenterology

## 2024-01-03 ENCOUNTER — Other Ambulatory Visit: Payer: Self-pay | Admitting: Gastroenterology

## 2024-01-04 ENCOUNTER — Encounter: Payer: Self-pay | Admitting: Gastroenterology

## 2024-01-08 NOTE — Telephone Encounter (Signed)
 Left message for patient to call back

## 2024-01-08 NOTE — Telephone Encounter (Signed)
 Spoke to patient who states that she is feeling much better now. States Thursday and Friday were difficult but starting Saturday, episodic pain began to subside. She plans to keep her upcoming EUS with Dr Wilhelmenia for further evaluation of the pancreas.

## 2024-01-10 ENCOUNTER — Encounter (HOSPITAL_COMMUNITY): Payer: Self-pay | Admitting: Gastroenterology

## 2024-01-12 NOTE — Telephone Encounter (Signed)
 I have spoken to patient who states that she has been having some rectal discomfort since colonoscopy. She denies any drainage or rectal bleeding. Denies any straining and states that her bowels have been normal consistency.  She states that her husband helped her with preparation H suppositories last night and thought it looked like her anus was prolapsed. I advised that colonoscopy 6/5 showed a perianal skin tag and internal hemorrhoids so it is possible her husband is seeing one of these things.  I spoke to Dr Leigh and he recommends patient purchase hydrocortisone cream, glycerin  suppositories OTC. Advised to apply hydrocortisone cream over the glycerin  suppositories and use nightly x 2 weeks. Also advised that she should purchase calmol4 to act as a barrier cream and help with irritation. She verbalizes understanding and will call back if continued symptoms despite these changes.

## 2024-01-12 NOTE — Telephone Encounter (Signed)
 Inbound call form patient, states she believes she has an anal prolapse and she has seen no improvement with the symptoms below. She states she would like to speak to [place],.

## 2024-01-17 ENCOUNTER — Encounter (HOSPITAL_COMMUNITY)
Admission: RE | Disposition: A | Discharge status: Home / Self Care | Payer: Self-pay | Source: Home / Self Care | Attending: Gastroenterology

## 2024-01-17 ENCOUNTER — Encounter (HOSPITAL_COMMUNITY): Payer: Self-pay | Admitting: Gastroenterology

## 2024-01-17 ENCOUNTER — Other Ambulatory Visit: Payer: Self-pay

## 2024-01-17 ENCOUNTER — Ambulatory Visit (HOSPITAL_COMMUNITY): Admitting: Anesthesiology

## 2024-01-17 ENCOUNTER — Ambulatory Visit (HOSPITAL_COMMUNITY)
Admission: RE | Admit: 2024-01-17 | Discharge: 2024-01-17 | Disposition: A | Discharge status: Home / Self Care | Attending: Gastroenterology | Admitting: Gastroenterology

## 2024-01-17 DIAGNOSIS — Z6841 Body Mass Index (BMI) 40.0 and over, adult: Secondary | ICD-10-CM

## 2024-01-17 DIAGNOSIS — K8500 Idiopathic acute pancreatitis without necrosis or infection: Secondary | ICD-10-CM | POA: Diagnosis not present

## 2024-01-17 DIAGNOSIS — K317 Polyp of stomach and duodenum: Secondary | ICD-10-CM | POA: Diagnosis not present

## 2024-01-17 DIAGNOSIS — K449 Diaphragmatic hernia without obstruction or gangrene: Secondary | ICD-10-CM

## 2024-01-17 DIAGNOSIS — K31A19 Gastric intestinal metaplasia without dysplasia, unspecified site: Secondary | ICD-10-CM | POA: Insufficient documentation

## 2024-01-17 DIAGNOSIS — K8689 Other specified diseases of pancreas: Secondary | ICD-10-CM

## 2024-01-17 DIAGNOSIS — E6689 Other obesity not elsewhere classified: Secondary | ICD-10-CM | POA: Diagnosis not present

## 2024-01-17 DIAGNOSIS — I899 Noninfective disorder of lymphatic vessels and lymph nodes, unspecified: Secondary | ICD-10-CM | POA: Diagnosis not present

## 2024-01-17 DIAGNOSIS — K3189 Other diseases of stomach and duodenum: Secondary | ICD-10-CM | POA: Diagnosis not present

## 2024-01-17 DIAGNOSIS — E785 Hyperlipidemia, unspecified: Secondary | ICD-10-CM | POA: Diagnosis not present

## 2024-01-17 DIAGNOSIS — K209 Esophagitis, unspecified without bleeding: Secondary | ICD-10-CM | POA: Diagnosis not present

## 2024-01-17 DIAGNOSIS — E282 Polycystic ovarian syndrome: Secondary | ICD-10-CM | POA: Diagnosis not present

## 2024-01-17 DIAGNOSIS — K2289 Other specified disease of esophagus: Secondary | ICD-10-CM | POA: Diagnosis not present

## 2024-01-17 DIAGNOSIS — K2980 Duodenitis without bleeding: Secondary | ICD-10-CM | POA: Diagnosis not present

## 2024-01-17 DIAGNOSIS — Z87891 Personal history of nicotine dependence: Secondary | ICD-10-CM | POA: Insufficient documentation

## 2024-01-17 DIAGNOSIS — D131 Benign neoplasm of stomach: Secondary | ICD-10-CM

## 2024-01-17 DIAGNOSIS — K861 Other chronic pancreatitis: Secondary | ICD-10-CM | POA: Insufficient documentation

## 2024-01-17 DIAGNOSIS — Z8719 Personal history of other diseases of the digestive system: Secondary | ICD-10-CM

## 2024-01-17 DIAGNOSIS — K2970 Gastritis, unspecified, without bleeding: Secondary | ICD-10-CM

## 2024-01-17 DIAGNOSIS — K319 Disease of stomach and duodenum, unspecified: Secondary | ICD-10-CM

## 2024-01-17 HISTORY — PX: EUS: SHX5427

## 2024-01-17 HISTORY — PX: ESOPHAGOGASTRODUODENOSCOPY: SHX5428

## 2024-01-17 LAB — PREGNANCY, URINE: Preg Test, Ur: NEGATIVE

## 2024-01-17 SURGERY — ULTRASOUND, UPPER GI TRACT, ENDOSCOPIC
Anesthesia: Monitor Anesthesia Care

## 2024-01-17 MED ORDER — PROPOFOL 500 MG/50ML IV EMUL
INTRAVENOUS | Status: DC | PRN
Start: 2024-01-17 — End: 2024-01-17
  Administered 2024-01-17: 180 ug/kg/min via INTRAVENOUS

## 2024-01-17 MED ORDER — SODIUM CHLORIDE 0.9 % IV SOLN: INTRAVENOUS | Status: DC

## 2024-01-17 MED ORDER — LIDOCAINE HCL (PF) 2 % IJ SOLN
INTRAMUSCULAR | Status: DC | PRN
  Administered 2024-01-17: 40 mg via INTRADERMAL

## 2024-01-17 MED ORDER — PANTOPRAZOLE SODIUM 40 MG PO TBEC: 40.0000 mg | DELAYED_RELEASE_TABLET | Freq: Every day | ORAL | 6 refills | Status: DC

## 2024-01-17 MED ORDER — PROPOFOL 10 MG/ML IV BOLUS
INTRAVENOUS | Status: DC | PRN
  Administered 2024-01-17 (×5): 20 mg via INTRAVENOUS

## 2024-01-17 NOTE — Discharge Instructions (Signed)

## 2024-01-17 NOTE — Op Note (Signed)
 [facility] Patient Name: Alexis Howell  Procedure Date: 01/17/2024 MRN: [id number] Attending MD: Aloha Finner , MD, [id number] Date of Birth: [birth year] CSN: [id number] Age: 43 Admit Type: Outpatient Procedure:                Upper EUS Indications:              Acute recurrent pancreatitis Providers:                Aloha Finner, MD, Ozell Pouch, Haskel Chris, Technician Referring MD:              Medicines:                Monitored Anesthesia Care Complications:            No immediate complications. Estimated Blood Loss:     Estimated blood loss was minimal. Procedure:                Pre-Anesthesia Assessment:                           - Prior to the procedure, a History and Physical                            was performed, and patient medications and                            allergies were reviewed. The patient's tolerance of                            previous anesthesia was also reviewed. The risks                            and benefits of the procedure and the sedation                            options and risks were discussed with the patient.                            All questions were answered, and informed consent                            was obtained. Prior Anticoagulants: The patient has                            taken no anticoagulant or antiplatelet agents. ASA                            Grade Assessment: III - A patient with severe                            systemic disease. After reviewing the risks and  benefits, the patient was deemed in satisfactory                            condition to undergo the procedure.                           After obtaining informed consent, the endoscope was                            passed under direct vision. Throughout the                            procedure, the patient's blood pressure, pulse, and                             oxygen saturations were monitored continuously. The                            [id number]-[id number] ([id number]) Olympus endoscope was introduced                            through the mouth, and advanced to the second part                            of duodenum. The [id number]-[id number] ([id number]) Olympus                            duodenoscope was introduced through the mouth, and                            advanced to the area of papilla. The [id number]                            ([id number]) Olympus linear ultrasound scope was                            introduced through the mouth, and advanced to the                            duodenum for ultrasound examination from the                            stomach and duodenum. The upper EUS was                            accomplished without difficulty. The patient                            tolerated the procedure. Scope In: Scope Out: Findings:      ENDOSCOPIC FINDING: :      No gross lesions were noted in the proximal esophagus and in the mid       esophagus.      LA Grade A (one or more mucosal breaks less than 5 mm, not extending  between tops of 2 mucosal folds) esophagitis with no bleeding was found       in the distal esophagus.      The Z-line was irregular and was found 38 cm from the incisors.      A 1 cm hiatal hernia was present.      A few small sessile polyps were found in the gastric fundus and in the       gastric body (these are consistent with fundic gland polyps).      Patchy mildly erythematous mucosa without bleeding was found in the       entire examined stomach. Biopsies were taken with a cold forceps for       histology and Helicobacter pylori testing.      No gross lesions were noted in the duodenal bulb, in the first portion       of the duodenum and in the second portion of the duodenum.      Localized mildly congested mucosa without active bleeding and with no       stigmata of bleeding was found at the major papilla. Biopsies were  taken       with a cold forceps for histology to rule out dysplasia.      ENDOSONOGRAPHIC FINDING: :      There was no sign of significant endosonographic abnormality in the       pancreatic head (PD = 2.6 mm), genu of the pancreas (1.2 mm), pancreatic       body (1.1 mm), pancreatic tail (0.5 mm). No masses, no cysts, no       calcifications, the pancreatic duct was thin in caliber.      There was no sign of significant endosonographic abnormality in the       common bile duct and in the common hepatic duct. No stones, no biliary       sludge and ducts with regular contour were identified.      Endosonographic imaging of the ampulla showed no intramural       (subepithelial) lesion.      Endosonographic imaging in the visualized portion of the liver showed no       mass.      No malignant-appearing lymph nodes were visualized in the celiac region       (level 20), peripancreatic region and porta hepatis region.      The celiac region was visualized. Impression:               EGD impression:                           - No gross lesions in the proximal esophagus and in                            the mid esophagus. LA Grade A esophagitis with no                            bleeding noted distally. Z-line irregular, 38 cm                            from the incisors.                           - 1 cm  hiatal hernia.                           - A few gastric polyps (consistent with fundic                            gland polyps).                           - Erythematous mucosa in the stomach. Biopsied.                           - No gross lesions in the duodenal bulb, in the                            first portion of the duodenum and in the second                            portion of the duodenum.                           - Congested major papilla mucosa. Biopsied.                           EUS impression:                           - There was no sign of significant pathology in the                             pancreatic head, genu of the pancreas, pancreatic                            body, pancreatic tail.                           - There was no sign of significant pathology in the                            common bile duct and in the common hepatic duct. No                            evidence of choledocholithiasis retainment.                           - No evidence of ampullary lesion                           - No malignant-appearing lymph nodes were                            visualized in the celiac region (level 20),                            peripancreatic region and porta hepatis region. Moderate Sedation:  Not Applicable - Patient had care per Anesthesia. Recommendation:           - The patient will be observed post-procedure,                            until all discharge criteria are met.                           - Discharge patient to home.                           - Patient has a contact number available for                            emergencies. The signs and symptoms of potential                            delayed complications were discussed with the                            patient. Return to normal activities tomorrow.                            Written discharge instructions were provided to the                            patient.                           - Resume previous diet.                           - Continue present medications.                           - Observe patient's clinical course.                           - Await path results.                           - If patient's papillary biopsies returned showing                            evidence of dysplastic tissue/adenoma, then                            endoscopic ampullectomy will need to be discussed                            with the patient fully.                           - If tissue is negative for dysplasia, and she                            continues  to have episodes  of acute recurrent                            pancreatitis, consider additional genetic testing                            workup and referral to [facility] for consideration of                            pancreatic sphincter dysfunction evaluation                            (previously SOD).                           - The findings and recommendations were discussed                            with the patient.                           - The findings and recommendations were discussed                            with the patient's family. Procedure Code(s):        --- Professional ---                           [phone], Esophagogastroduodenoscopy, flexible,                            transoral; with endoscopic ultrasound examination                            limited to the esophagus, stomach or duodenum, and                            adjacent structures                           43239, Esophagogastroduodenoscopy, flexible,                            transoral; with biopsy, single or multiple Diagnosis Code(s):        --- Professional ---                           K20.90, Esophagitis, unspecified without bleeding                           K22.89, Other specified disease of esophagus                           K44.9, Diaphragmatic hernia without obstruction or                            gangrene  K31.7, Polyp of stomach and duodenum                           K31.89, Other diseases of stomach and duodenum                           I89.9, Noninfective disorder of lymphatic vessels                            and lymph nodes, unspecified                           K85.90, Acute pancreatitis without necrosis or                            infection, unspecified CPT copyright 2022 American Medical Association. All rights reserved. The codes documented in this report are preliminary and upon coder review may  be revised to meet current compliance requirements. Aloha Finner,  MD 01/17/2024 11:43:32 AM Number of Addenda: 0

## 2024-01-17 NOTE — Anesthesia Preprocedure Evaluation (Addendum)
 Anesthesia Evaluation  Patient identified by MRN, date of birth, ID band Patient awake    Reviewed: Allergy & Precautions, NPO status , Patient's Chart, lab work & pertinent test results  History of Anesthesia Complications Negative for: history of anesthetic complications  Airway Mallampati: III  TM Distance: >3 FB Neck ROM: Full    Dental  (+) Dental Advisory Given   Pulmonary former smoker   breath sounds clear to auscultation       Cardiovascular (-) angina negative cardio ROS  Rhythm:Regular Rate:Normal     Neuro/Psych negative neurological ROS     GI/Hepatic negative GI ROS, Neg liver ROS,,,  Endo/Other    Class 4 obesityBMI 60 PCOS  Renal/GU negative Renal ROS     Musculoskeletal   Abdominal   Peds  Hematology negative hematology ROS (+)   Anesthesia Other Findings   Reproductive/Obstetrics                              Anesthesia Physical Anesthesia Plan  ASA: 3  Anesthesia Plan: MAC   Post-op Pain Management: Minimal or no pain anticipated   Induction:   PONV Risk Score and Plan: 2 and Ondansetron  and Dexamethasone   Airway Management Planned: Natural Airway and Simple Face Mask  Additional Equipment: None  Intra-op Plan:   Post-operative Plan:   Informed Consent: I have reviewed the patients History and Physical, chart, labs and discussed the procedure including the risks, benefits and alternatives for the proposed anesthesia with the patient or authorized representative who has indicated his/her understanding and acceptance.     Dental advisory given  Plan Discussed with: CRNA and Surgeon  Anesthesia Plan Comments:          Anesthesia Quick Evaluation

## 2024-01-17 NOTE — H&P (Signed)
 GASTROENTEROLOGY PROCEDURE H&P NOTE   Primary Care Physician: Burney Darice CROME, MD  HPI: Alexis Howell  is a 43 y.o. female who presents for Upper EUS for evaluation of recurrent idiopathic pancreatitis and rule out other etiologies.  Past Medical History:  Diagnosis Date   Delivery of pregnancy by cesarean section: Indication: arrest of dilation 11/26/2016   Hyperlipemia    Obesity    Pancreatitis    two episodes - 2022 and 2025 - unclear etiology   PCOS (polycystic ovarian syndrome)    Pneumonia    in her 20's   Prolonged first stage (of labor) 11/26/2016   Past Surgical History:  Procedure Laterality Date   BIOPSY  09/09/2021   Procedure: BIOPSY;  Surgeon: Teressa Toribio SQUIBB, MD;  Location: [facility];  Service: Endoscopy;;   CESAREAN SECTION N/A 11/26/2016   Procedure: CESAREAN SECTION;  Surgeon: Kandyce Sor, MD;  Location: [facility];  Service: Obstetrics;  Laterality: N/A;   CHOLECYSTECTOMY N/A 08/12/2014   Procedure: LAPAROSCOPIC CHOLECYSTECTOMY WITH INTRAOPERATIVE CHOLANGIOGRAM;  Surgeon: Camellia CHRISTELLA Blush, MD;  Location: [facility];  Service: General;  Laterality: N/A;   COLONOSCOPY WITH PROPOFOL  N/A 11/23/2023   Procedure: COLONOSCOPY WITH PROPOFOL ;  Surgeon: Leigh Elspeth SQUIBB, MD;  Location: [facility];  Service: Gastroenterology;  Laterality: N/A;   ESOPHAGOGASTRODUODENOSCOPY (EGD) WITH PROPOFOL  N/A 09/09/2021   Procedure: ESOPHAGOGASTRODUODENOSCOPY (EGD) WITH PROPOFOL ;  Surgeon: Teressa Toribio SQUIBB, MD;  Location: [facility];  Service: Endoscopy;  Laterality: N/A;   TONSILLECTOMY AND ADENOIDECTOMY     WISDOM TOOTH EXTRACTION     Current Facility-Administered Medications  Medication Dose Route Frequency Provider Last Rate Last Admin   0.9 %  sodium chloride  infusion   Intravenous Continuous Mansouraty, Aloha Raddle., MD        Current Facility-Administered Medications:    0.9 %  sodium chloride  infusion, , Intravenous, Continuous, Mansouraty, Aloha Raddle.,  MD No Known Allergies Family History  Problem Relation Age of Onset   Heart disease Mother    Diverticulitis Mother    Seizures Sister    Diverticulitis Sister    Mental illness Paternal Grandmother    Social History   Socioeconomic History   Marital status: Married    Spouse name: Celestine Lander   Number of children: 1   Years of education: Not on file   Highest education level: Master's degree (e.g., MA, MS, MEng, MEd, MSW, MBA)  Occupational History   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   Lives with her husband, their son (born 11/26/2016) and dog and cat.   Family is spread out: parents in [place] , sister in [place], born in [place]).   Husband's family is local.   Social Drivers of Health   Financial Resource Strain: Not on file  Food Insecurity: No Food Insecurity (10/05/2023)   Hunger Vital Sign  Worried About Programme researcher, broadcasting/film/video in the Last Year: Never true    Ran Out of Food in the Last Year: Never true  Transportation Needs: No Transportation Needs (10/05/2023)   PRAPARE - Administrator, Civil Service (Medical): No    Lack of Transportation (Non-Medical): No  Physical Activity: Not on file  Stress: Not on file  Social Connections: Socially Integrated (10/05/2023)   Social Connection and Isolation Panel    Frequency of Communication with Friends and Family: Three times a week    Frequency of Social Gatherings  with Friends and Family: Three times a week    Attends Religious Services: More than 4 times per year    Active Member of Clubs or Organizations: Yes    Attends Banker Meetings: More than 4 times per year    Marital Status: Married  Catering manager Violence: Not At Risk (10/05/2023)   Humiliation, Afraid, Rape, and Kick questionnaire    Fear of Current or Ex-Partner: No    Emotionally Abused: No    Physically Abused: No    Sexually Abused: No    Physical Exam: Today's Vitals   01/10/24 1151  Weight: (!) 140 kg   Body mass index is 52.98 kg/m. GEN: NAD EYE: Sclerae anicteric ENT: MMM CV: Non-tachycardic GI: Soft, NT/ND NEURO:  Alert & Oriented x 3  Lab Results: No results for input(s): WBC, HGB, HCT, PLT in the last 72 hours. BMET No results for input(s): NA, K, CL, CO2, GLUCOSE, BUN, CREATININE, CALCIUM in the last 72 hours. LFT No results for input(s): PROT, ALBUMIN, AST, ALT, ALKPHOS, BILITOT, BILIDIR, IBILI in the last 72 hours. PT/INR No results for input(s): LABPROT, INR in the last 72 hours.   Impression / Plan: This is a 44 y.o.female who presents for Upper EUS for evaluation of recurrent idiopathic pancreatitis and rule out other etiologies.  The risks of an EUS including intestinal perforation, bleeding, infection, aspiration, and medication effects were discussed as was the possibility it may not give a definitive diagnosis if a biopsy is performed.  When a biopsy of the pancreas is done as part of the EUS, there is an additional risk of pancreatitis at the rate of about 1-2%.  It was explained that procedure related pancreatitis is typically mild, although it can be severe and even life threatening, which is why we do not perform random pancreatic biopsies and only biopsy a lesion/area we feel is concerning enough to warrant the risk.  The risks and benefits of endoscopic evaluation/treatment were discussed  with the patient and/or family; these include but are not limited to the risk of perforation, infection, bleeding, missed lesions, lack of diagnosis, severe illness requiring hospitalization, as well as anesthesia and sedation related illnesses.  The patient's history has been reviewed, patient examined, no change in status, and deemed stable for procedure.  The patient and/or family is agreeable to proceed.    Aloha Finner, MD [facility]oscopy Office # [phone]

## 2024-01-17 NOTE — Transfer of Care (Signed)
 Immediate Anesthesia Transfer of Care Note  Patient: Alexis Howell   Procedure(s) Performed: ULTRASOUND, UPPER GI TRACT, ENDOSCOPIC EGD (ESOPHAGOGASTRODUODENOSCOPY)  Patient Location: Endoscopy Unit  Anesthesia Type:MAC  Level of Consciousness: awake, alert , oriented, and patient cooperative  Airway & Oxygen Therapy: Patient Spontanous Breathing  Post-op Assessment: Report given to RN and Post -op Vital signs reviewed and stable  Post vital signs: Reviewed  Last Vitals:  Vitals Value Taken Time  BP 130/89 01/17/24 11:44  Temp 36.7 C 01/17/24 11:44  Pulse 86 01/17/24 11:47  Resp 32 01/17/24 11:47  SpO2 97 % 01/17/24 11:47  Vitals shown include unfiled device data.  Last Pain:  Vitals:   01/17/24 1144  TempSrc: Temporal  PainSc: 3       Patients Stated Pain Goal: 0 (01/17/24 0820)  Complications: No notable events documented.

## 2024-01-17 NOTE — Anesthesia Postprocedure Evaluation (Signed)
 Anesthesia Post Note  Patient: Ashey Tramontana Adler   Procedure(s) Performed: ULTRASOUND, UPPER GI TRACT, ENDOSCOPIC EGD (ESOPHAGOGASTRODUODENOSCOPY)     Patient location during evaluation: Endoscopy Anesthesia Type: MAC Level of consciousness: oriented, awake and alert and patient cooperative Pain management: pain level controlled Vital Signs Assessment: post-procedure vital signs reviewed and stable Respiratory status: spontaneous breathing, nonlabored ventilation and respiratory function stable Cardiovascular status: blood pressure returned to baseline and stable Postop Assessment: no apparent nausea or vomiting, adequate PO intake and able to ambulate Anesthetic complications: no   No notable events documented.  Last Vitals:  Vitals:   01/17/24 1200 01/17/24 1210  BP: (!) 151/66 (!) 146/90  Pulse: 83 80  Resp: (!) 27 (!) 21  Temp:    SpO2: 98% 99%    Last Pain:  Vitals:   01/17/24 1210  TempSrc:   PainSc: 3                  Jemmie Rhinehart,E. Saki Legore

## 2024-01-18 ENCOUNTER — Encounter (HOSPITAL_COMMUNITY): Payer: Self-pay | Admitting: Gastroenterology

## 2024-01-18 LAB — SURGICAL PATHOLOGY

## 2024-01-20 ENCOUNTER — Ambulatory Visit: Payer: Self-pay | Admitting: Gastroenterology

## 2024-01-22 ENCOUNTER — Encounter (HOSPITAL_BASED_OUTPATIENT_CLINIC_OR_DEPARTMENT_OTHER): Payer: Self-pay | Admitting: Certified Nurse Midwife

## 2024-01-22 ENCOUNTER — Ambulatory Visit (HOSPITAL_BASED_OUTPATIENT_CLINIC_OR_DEPARTMENT_OTHER): Payer: Self-pay | Admitting: Certified Nurse Midwife

## 2024-01-22 VITALS — BP 140/71 | HR 89 | Ht 64.0 in | Wt 307.4 lb

## 2024-01-22 DIAGNOSIS — N921 Excessive and frequent menstruation with irregular cycle: Secondary | ICD-10-CM | POA: Diagnosis not present

## 2024-01-22 DIAGNOSIS — N946 Dysmenorrhea, unspecified: Secondary | ICD-10-CM

## 2024-01-22 DIAGNOSIS — E282 Polycystic ovarian syndrome: Secondary | ICD-10-CM

## 2024-01-22 DIAGNOSIS — Z6841 Body Mass Index (BMI) 40.0 and over, adult: Secondary | ICD-10-CM

## 2024-01-22 DIAGNOSIS — Z8742 Personal history of other diseases of the female genital tract: Secondary | ICD-10-CM

## 2024-01-22 DIAGNOSIS — Z1231 Encounter for screening mammogram for malignant neoplasm of breast: Secondary | ICD-10-CM

## 2024-01-22 DIAGNOSIS — N83202 Unspecified ovarian cyst, left side: Secondary | ICD-10-CM

## 2024-01-22 NOTE — Progress Notes (Signed)
 Alexis Howell here to discuss periods. Periods are frequent and painful. Hx of ovarian cysts. No known Hx Fibroids. Pt has been told in past Alexis Howell likely has PCOS. Reports Hx GDM, Pre-Diabetes, gains wt easily and has difficulty losing weight, + hirsutism chin.   Alexis Howell lives with Alexis Howell supportive spouse and Alexis Howell 7yo son. 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  Alexis Howell labored at [facility] but was transferred to hospital for [facility] due to arrest of dilatation 6cm. Pt had GDM with his pregnancy. In 2020 Alexis Howell experienced a spontaneous miscarriage and required D&C.  Pt concerned that Alexis Howell periods are approx every 21 days lasting ~6 days and heavy. Pt states that for 4 out of 6 days Alexis Howell requires both tampon and maxi-pad due to heaviness. Periods also painful. Alexis Howell cannot take NSAIDS due to current GI issues.  Alexis Howell had LMP on July 12th-18th and started another period on July 31st. No intermenstrual spotting or bleeding.  Alexis Howell Mother had fibroids and required hysterectomy.  Alexis Howell's last menstrual period was 01/19/2024.          Sexually active: Yes.    The current method of family planning is rhythm method.     Exercising: Yes.     Smoker:  no  Health Maintenance: Pap:  1 year ago Negative History of abnormal Pap:  no MMG:  No Colonoscopy:  Pt followed by Gastroenterology BMD:   n/a Screening Labs: Dr. Burney   reports that Alexis Howell quit smoking about 14 years ago. Alexis Howell smoking use included cigarettes. Alexis Howell has never used smokeless tobacco. Alexis Howell reports current alcohol use. Alexis Howell reports that Alexis Howell does not use drugs.  Past Medical History:  Diagnosis Date   Delivery of pregnancy by cesarean section: Indication: arrest of dilation 11/26/2016   Hyperlipemia    Obesity    Pancreatitis    two episodes - 2022 and 2025 - unclear etiology   PCOS (polycystic ovarian syndrome)    Pneumonia    in Alexis Howell 20's   Prolonged first stage (of labor) 11/26/2016     Past Surgical History:  Procedure Laterality Date   BIOPSY  09/09/2021   Procedure: BIOPSY;  Surgeon: Teressa Toribio SQUIBB, MD;  Location: [facility];  Service: Endoscopy;;   CESAREAN SECTION N/A 11/26/2016   Procedure: CESAREAN SECTION;  Surgeon: Kandyce Sor, MD;  Location: [facility];  Service: Obstetrics;  Laterality: N/A;   CHOLECYSTECTOMY N/A 08/12/2014   Procedure: LAPAROSCOPIC CHOLECYSTECTOMY WITH INTRAOPERATIVE CHOLANGIOGRAM;  Surgeon: Camellia CHRISTELLA Blush, MD;  Location: [facility];  Service: General;  Laterality: N/A;   COLONOSCOPY WITH PROPOFOL  N/A 11/23/2023   Procedure: COLONOSCOPY WITH PROPOFOL ;  Surgeon: Leigh Elspeth SQUIBB, MD;  Location: [facility];  Service: Gastroenterology;  Laterality: N/A;   ESOPHAGOGASTRODUODENOSCOPY N/A 01/17/2024   Procedure: EGD (ESOPHAGOGASTRODUODENOSCOPY);  Surgeon: Wilhelmenia Aloha Raddle., MD;  Location: [facility];  Service: Gastroenterology;  Laterality: N/A;   ESOPHAGOGASTRODUODENOSCOPY (EGD) WITH PROPOFOL  N/A 09/09/2021   Procedure: ESOPHAGOGASTRODUODENOSCOPY (EGD) WITH PROPOFOL ;  Surgeon: Teressa Toribio SQUIBB, MD;  Location: [facility];  Service: Endoscopy;  Laterality: N/A;   EUS N/A 01/17/2024   Procedure: ULTRASOUND, UPPER GI TRACT, ENDOSCOPIC;  Surgeon: Wilhelmenia Aloha Raddle., MD;  Location: [facility];  Service: Gastroenterology;  Laterality: N/A;   TONSILLECTOMY AND ADENOIDECTOMY     WISDOM TOOTH EXTRACTION      Current Outpatient Medications  Medication Sig Dispense Refill   ALPRAZolam (XANAX) 0.25 MG tablet Take 0.25 mg by mouth 2 (two) times  daily.     colestipol  (COLESTID ) 1 g tablet TAKE 1 TABLET BY MOUTH 2 TIMES DAILY. 60 tablet 1   ondansetron  (ZOFRAN -ODT) 4 MG disintegrating tablet Take 1 tablet (4 mg total) by mouth every 8 (eight) hours as needed for nausea or vomiting. 20 tablet 0   oxyCODONE  (OXY IR/ROXICODONE ) 5 MG immediate release tablet Take 1 tablet (5 mg total) by mouth every 6 (six) hours as needed for moderate  pain (pain score 4-6). 20 tablet 0   pantoprazole  (PROTONIX ) 40 MG tablet Take 1 tablet (40 mg total) by mouth daily. 30 tablet 6   Red Yeast Rice Extract (RED YEAST RICE PO) Take 1,215 mg by mouth daily. Co Q 10     ASHWAGANDHA PO Take 1,000 mg by mouth daily. (Alexis Howell not taking: Reported on 01/22/2024)     Barberry-Oreg Grape-Goldenseal (BERBERINE COMPLEX PO) Take 200 mg by mouth daily. Turmeric (Alexis Howell not taking: Reported on 01/22/2024)     No current facility-administered medications for this visit.    Family History  Problem Relation Age of Onset   Heart disease Mother    Diverticulitis Mother    Seizures Sister    Diverticulitis Sister    Mental illness Paternal Grandmother     ROS: Constitutional: negative for fevers Genitourinary:negative for dysuria  Exam:   BP (!) 140/71   Pulse 89   Ht 5' 4 (1.626 m)   Wt (!) 307 lb 6.4 oz (139.4 kg)   LMP 01/19/2024   BMI 52.77 kg/m   Height: 5' 4 (162.6 cm)  General appearance: alert, cooperative and appears stated age Extremities: extremities normal, atraumatic, no cyanosis or edema Skin: Skin color, texture, turgor normal. No rashes or lesions Lymph nodes: Cervical, supraclavicular, and axillary nodes normal. No abnormal inguinal nodes palpated Neurologic: Grossly normal   Pelvic: External genitalia:  no lesions              Urethra:  normal appearing urethra with no masses, tenderness or lesions              Bartholins and Skenes: normal                 Vagina: normal appearing vagina with normal color and no discharge, no lesions              Cervix: no cervical motion tenderness and no lesions              Pap taken: No. Bimanual Exam:  Uterus:  enlarged, 8 weeks size              Adnexa: no mass, fullness, tenderness and difficulty to evaluate               Rectovaginal: Confirms               Anus:  normal sphincter tone, no lesions  Chaperone, CMA, was present for exam.  Assessment/Plan:  1. Menorrhagia with  irregular cycle (Primary) - Discussed availability of Mirena IUD for menorrhagia. Pt does not desire future childbearing at this time. - Will need US  prior to a Mirena insertion/scheduled. Pt aware of availability low-dose COC and Mirena IUD, etc. If desired at any time in future - CBC - Comp Met (CMET) - Iron , TIBC and Ferritin Panel - Hemoglobin A1c - VITAMIN D  25 Hydroxy (Vit-D Deficiency, Fractures) - B12 - US  PELVIC COMPLETE WITH TRANSVAGINAL; Future  2. Encounter for screening mammogram for malignant neoplasm of breast - MM 3D SCREENING MAMMOGRAM  BILATERAL BREAST; Future  3. Dysmenorrhea - Not a candidate for NSAIDS due to GI issues  4. PCOS (polycystic ovarian syndrome)   5. History of ovarian cyst - US  pending/follow-up  6. BMI 50.0-59.9, adult (HCC)  Pt will return to office in 2 days for Pelvic US  to evaluate uterus/ovaries. Alexis Howell

## 2024-01-23 LAB — COMPREHENSIVE METABOLIC PANEL WITH GFR
ALT: 20 IU/L (ref 0–32)
AST: 17 IU/L (ref 0–40)
Albumin: 4.4 g/dL (ref 3.9–4.9)
Alkaline Phosphatase: 86 IU/L (ref 44–121)
BUN/Creatinine Ratio: 16 (ref 9–23)
BUN: 14 mg/dL (ref 6–24)
Bilirubin Total: 0.2 mg/dL (ref 0.0–1.2)
CO2: 21 mmol/L (ref 20–29)
Calcium: 9.6 mg/dL (ref 8.7–10.2)
Chloride: 100 mmol/L (ref 96–106)
Creatinine, Ser: 0.86 mg/dL (ref 0.57–1.00)
Globulin, Total: 2.6 g/dL (ref 1.5–4.5)
Glucose: 100 mg/dL — ABNORMAL HIGH (ref 70–99)
Potassium: 4.9 mmol/L (ref 3.5–5.2)
Sodium: 136 mmol/L (ref 134–144)
Total Protein: 7 g/dL (ref 6.0–8.5)
eGFR: 87 mL/min/1.73 (ref >59)

## 2024-01-23 LAB — CBC
Hematocrit: 42.7 % (ref 34.0–46.6)
Hemoglobin: 13.4 g/dL (ref 11.1–15.9)
MCH: 26.9 pg (ref 26.6–33.0)
MCHC: 31.4 g/dL — ABNORMAL LOW (ref 31.5–35.7)
MCV: 86 fL (ref 79–97)
Platelets: 340 x10E3/uL (ref 150–450)
RBC: 4.98 x10E6/uL (ref 3.77–5.28)
RDW: 14.1 % (ref 11.7–15.4)
WBC: 7.7 x10E3/uL (ref 3.4–10.8)

## 2024-01-23 LAB — HEMOGLOBIN A1C
Est. average glucose Bld gHb Est-mCnc: 111 mg/dL
Hgb A1c MFr Bld: 5.5 % (ref 4.8–5.6)

## 2024-01-23 LAB — IRON,TIBC AND FERRITIN PANEL
Ferritin: 47 ng/mL (ref 15–150)
Iron Saturation: 12 % — ABNORMAL LOW (ref 15–55)
Iron: 43 ug/dL (ref 27–159)
Total Iron Binding Capacity: 352 ug/dL (ref 250–450)
UIBC: 309 ug/dL (ref 131–425)

## 2024-01-23 LAB — VITAMIN B12: Vitamin B-12: 664 pg/mL (ref 232–1245)

## 2024-01-23 LAB — VITAMIN D 25 HYDROXY (VIT D DEFICIENCY, FRACTURES): Vit D, 25-Hydroxy: 30 ng/mL (ref 30.0–100.0)

## 2024-01-24 ENCOUNTER — Encounter (HOSPITAL_BASED_OUTPATIENT_CLINIC_OR_DEPARTMENT_OTHER): Payer: Self-pay | Admitting: Certified Nurse Midwife

## 2024-01-24 ENCOUNTER — Other Ambulatory Visit (HOSPITAL_BASED_OUTPATIENT_CLINIC_OR_DEPARTMENT_OTHER)

## 2024-01-24 ENCOUNTER — Ambulatory Visit (HOSPITAL_BASED_OUTPATIENT_CLINIC_OR_DEPARTMENT_OTHER): Admitting: Certified Nurse Midwife

## 2024-01-24 ENCOUNTER — Ambulatory Visit (HOSPITAL_BASED_OUTPATIENT_CLINIC_OR_DEPARTMENT_OTHER): Payer: Self-pay | Admitting: Certified Nurse Midwife

## 2024-01-24 ENCOUNTER — Other Ambulatory Visit (HOSPITAL_COMMUNITY)
Admission: RE | Admit: 2024-01-24 | Discharge: 2024-01-24 | Disposition: A | Discharge status: Home / Self Care | Source: Ambulatory Visit | Attending: Certified Nurse Midwife | Admitting: Certified Nurse Midwife

## 2024-01-24 VITALS — BP 134/80 | HR 79 | Ht 64.0 in | Wt 307.0 lb

## 2024-01-24 DIAGNOSIS — N921 Excessive and frequent menstruation with irregular cycle: Secondary | ICD-10-CM | POA: Diagnosis present

## 2024-01-24 DIAGNOSIS — Z6841 Body Mass Index (BMI) 40.0 and over, adult: Secondary | ICD-10-CM

## 2024-01-24 DIAGNOSIS — N920 Excessive and frequent menstruation with regular cycle: Secondary | ICD-10-CM | POA: Diagnosis not present

## 2024-01-24 DIAGNOSIS — R9389 Abnormal findings on diagnostic imaging of other specified body structures: Secondary | ICD-10-CM

## 2024-01-24 MED ORDER — VITAMIN D (ERGOCALCIFEROL) 1.25 MG (50000 UNIT) PO CAPS
50000.0000 [IU] | ORAL_CAPSULE | ORAL | 12 refills | Status: DC
Start: 2024-01-24 — End: 2024-05-03

## 2024-01-24 NOTE — Progress Notes (Addendum)
  Subjective:     Alexis Howell  is a 43 y.o. female here for US  due to menorrhagia and frequent menses. US  today shows anteverted uterus appears normal in size without evidence of of focal abnormalities. Endometrium 1.2cm trilaminar (thick). Ovaries appeared normal bilaterally. Negative adnexal regions bilaterally. No fibroids noted. Pt is on Day 6 of her menstrual period. She has been experiencing periods twice a month in 2025. Endometrium appears thickened on US  today despite pt bleeding heavy past 6 days. Recent UPT negative. Pt unsure if she desires future childbearing.   US  (01/24/24) No latex used Pelvic structures were imaged in sagittal & transverse orientation transvaginally Anteverted uterus appears normal in size without evidence of focal abnormality EM 1-2cm trilaminar Ovaries neg bilaterally Neg adnexal regions bilaterally Neg cul de sac. No free fluid present. CXL wnl.  The following portions of the patient's history were reviewed and updated as appropriate: allergies, current medications, past family history, past medical history, past social history, past surgical history, and problem list.  Review of Systems Pertinent items are noted in HPI.    Objective:    BP 134/80   Pulse 79   Ht 5' 4 (1.626 m) Comment: Reported  Wt (!) 307 lb (139.3 kg) Comment: Reported  LMP 01/19/2024   BMI 52.70 kg/m  General appearance: alert, cooperative, and appears stated age Pelvic: cervix normal in appearance, external genitalia normal, no adnexal masses or tenderness, no cervical motion tenderness, rectovaginal septum normal, uterus normal size, shape, and consistency, and vagina normal without discharge    Assessment:    Frequent Menses, Menorrhagia, Thickened endometrial lining.    Plan:    CNM discussed results of ultrasound. We reviewed (again) available of Mirena IUD with goal being to decrease endometrial thickness/protective effect, decrease menorrhagia, potentially  lead to amenorrhea. She declines COC, POP or Mirena today but will consider. Pt agreeable to endometrial biopsy today. She cannot take NSAIDS.    Endometrial Biopsy Procedure Note  Pre-operative Diagnosis: Menorrhagia, thickened endometrium on day 6 of period  Post-operative Diagnosis: same  Indications: Menorrhagia, thickened endometrium after menstrual period, AUB   Procedure Details   Urine pregnancy test Negative.  The risks (including infection, bleeding, pain, and uterine perforation) and benefits of the procedure were explained to the patient and Written informed consent was obtained.  Antibiotic prophylaxis against endocarditis was not indicated.   The patient was placed in the dorsal lithotomy position.  Bimanual exam showed the uterus to be in the retroflexed position.  A Graves' speculum inserted in the vagina, and the cervix prepped with povidone iodine.   A sharp tenaculum was applied to the anterior lip of the cervix for stabilization.   A Pipelle endometrial aspirator was used to sample the endometrium.  Sample was sent for pathologic examination.  Condition: Stable  Complications: None  Plan:  The patient was advised to call for any fever or for prolonged or severe pain or bleeding. She was advised to use OTC acetaminophen  as needed for mild to moderate pain. She was advised to avoid vaginal intercourse for 48 hours or until the bleeding has completely stopped.  Arland MARLA Roller

## 2024-01-26 LAB — SURGICAL PATHOLOGY

## 2024-01-31 ENCOUNTER — Encounter (HOSPITAL_BASED_OUTPATIENT_CLINIC_OR_DEPARTMENT_OTHER): Payer: Self-pay | Admitting: Certified Nurse Midwife

## 2024-02-06 ENCOUNTER — Encounter: Admitting: Gastroenterology

## 2024-02-28 ENCOUNTER — Other Ambulatory Visit (INDEPENDENT_AMBULATORY_CARE_PROVIDER_SITE_OTHER)

## 2024-02-28 ENCOUNTER — Encounter: Payer: Self-pay | Admitting: Gastroenterology

## 2024-02-28 ENCOUNTER — Ambulatory Visit: Payer: Self-pay | Admitting: Gastroenterology

## 2024-02-28 ENCOUNTER — Ambulatory Visit: Admitting: Gastroenterology

## 2024-02-28 VITALS — BP 130/78 | HR 69 | Ht 64.0 in | Wt 308.0 lb

## 2024-02-28 DIAGNOSIS — R1012 Left upper quadrant pain: Secondary | ICD-10-CM

## 2024-02-28 DIAGNOSIS — R110 Nausea: Secondary | ICD-10-CM

## 2024-02-28 DIAGNOSIS — K602 Anal fissure, unspecified: Secondary | ICD-10-CM

## 2024-02-28 DIAGNOSIS — K625 Hemorrhage of anus and rectum: Secondary | ICD-10-CM | POA: Diagnosis not present

## 2024-02-28 DIAGNOSIS — K640 First degree hemorrhoids: Secondary | ICD-10-CM | POA: Diagnosis not present

## 2024-02-28 DIAGNOSIS — Z8719 Personal history of other diseases of the digestive system: Secondary | ICD-10-CM

## 2024-02-28 DIAGNOSIS — R197 Diarrhea, unspecified: Secondary | ICD-10-CM

## 2024-02-28 LAB — HEPATIC FUNCTION PANEL
ALT: 21 U/L (ref 0–35)
AST: 19 U/L (ref 0–37)
Albumin: 4.5 g/dL (ref 3.5–5.2)
Alkaline Phosphatase: 67 U/L (ref 39–117)
Bilirubin, Direct: 0.1 mg/dL (ref 0.0–0.3)
Total Bilirubin: 0.5 mg/dL (ref 0.2–1.2)
Total Protein: 7.8 g/dL (ref 6.0–8.3)

## 2024-02-28 LAB — LIPASE: Lipase: 17 U/L (ref 11.0–59.0)

## 2024-02-28 MED ORDER — PANTOPRAZOLE SODIUM 40 MG PO TBEC: 40.0000 mg | DELAYED_RELEASE_TABLET | Freq: Every day | ORAL | 5 refills | Status: AC

## 2024-02-28 MED ORDER — LOPERAMIDE HCL 2 MG PO CAPS: 2.0000 mg | ORAL_CAPSULE | ORAL | Status: DC | PRN

## 2024-02-28 MED ORDER — AMBULATORY NON FORMULARY MEDICATION
0 refills | Status: DC
Start: 2024-02-28 — End: 2024-04-12

## 2024-02-28 MED ORDER — ONDANSETRON 4 MG PO TBDP: 4.0000 mg | ORAL_TABLET | Freq: Three times a day (TID) | ORAL | 5 refills | Status: AC | PRN

## 2024-02-28 NOTE — Progress Notes (Signed)
 HPI :  43 year old female with a history of pancreatitis, rectal bleeding, here for a follow-up visit.   Recall she had a history of pancreatitis in December 2022 without a clear cause.  Unfortunately this past April 2025 she presented with recurrent acute pancreatitis. At the time of that exam her AST and ALT were normal along with normal alk phos and normal total bili.  Triglycerides were normal.  Incidentally her ALT and AST had risen slightly the 70s and 90s during that stay.  She has had a history of intermittently elevated liver enzymes.  She is status post cholecystectomy.   On the CT scan there was a question of possible masslike appearance in the uncinate process.  She had an MRCP done on April 17 which suggested this could be more related to sequela of acute pancreatitis although they could not necessarily exclude a mass lesion.  She had a CA 19-9 level that was normal.  She had a repeat CT of her pancreas on May 13 and there is no mass that is seen, it is normal.She had a cholecystectomy in 2018 before this started.  She has roughly 1-2 alcoholic beverages per month and does not drink on a routine basis.  She is not aware of any family history of pancreatic cancer or pancreatitis.  She denied any new medications around the time of this occurrence.  Since the last visit she had an EUS with Dr. Wilhelmenia on July 30.  This did not show any evidence of abnormalities of her pancreas.  She has had 4 isolated episodes of left upper quadrant pain wrapping around into her mid back area since June.  She states this was reminiscent of her prior pancreatitis symptoms although denied epigastric pain which she had previously.  She states she goes to a liquid diet when this happens and over the course of 2 to 3 days her symptoms resolved.  She is not using any NSAIDs.  In between these episodes she has doing okay.  She has some occasional nausea that can bother her.  She is otherwise had some persistent  loose stools in recent months since all this been happening.  She had a colonoscopy with me in June which looked okay, no obvious inflammatory changes.  At the time I did not realize her loose stools were persistent and biopsies were not taken.  She had internal hemorrhoids noted to be the cause of her bleeding symptoms at the time.  She had empiric Creon given to her previously which did not really help her much.  She denies any greasy/fatty stools.  I put her on Colestid  twice daily empirically but that has not really helped her stool frequency.  She typically will have 3-4 bowel movements per day on the looser side.  She states her rectal bleeding has been more frequent lately, with roughly 90% of her stools and she has some rectal discomfort with bowel movements.  She was initially scheduled here today for hemorrhoid banding.     Also of note she has hepatic steatosis noted on imaging.  Again she does not drink much of any alcohol. Her BMI is currently 52.9.  She states she gained about 50 pounds back in 2022, BMI has been over 50 lately.  Serologic workup for elevated liver enzymes in May was negative.  IgG4 also negative in light of her pancreatitis history.  She has been thinking about going on a GLP-1 agonist for her weight but has held off in light of  her GI symptoms.        PREVIOUS GI WORKUP    Ct ab/pelvis 04/2023 IMPRESSION: No acute findings or other significant abnormality. No radiographic evidence of urinary tract neoplasm, urolithiasis, or hydronephrosis.   CTAP w contrast 07/2021 IMPRESSION: 1. Small lymph nodes scattered within the central mesentery, upper abdomen and retroperitoneum, abnormal by number, but no enlarged lymph nodes present. This mild lymphadenopathy is a nonspecific finding but can be seen in the setting of a mesenteric adenitis or possibly residual reactive lymphadenopathy related to the earlier reported episode of pancreatitis. Recommend follow-up CT in  3-6 months to ensure stability or resolution and exclude the possibility of this representing early lymphoma. Alternatively, could consider nonemergent PET-CT now for further characterization. 2. Remainder of the abdomen and pelvis CT is unremarkable. No bowel obstruction or evidence of bowel wall inflammation. No evidence of pancreatitis. No renal or ureteral calculi. Appendix is normal.   EGD 08/2021: upper abdominal pain - Mild, non- specific gastritis. Biopsied to check for H. pylori.  - The examination was otherwise normal.   FINAL MICROSCOPIC DIAGNOSIS:   A.   STOMACH, BODY AND ANTRUM, BIOPSY:  -    Mild reactive gastropathy.  -    Negative for an inflammatory pattern predictive of Helicobacter  pylori infection.  -    Negative for intestinal metaplasia.  -    Negative for malignancy.        CT abdomen / pelvis 10/04/23: IMPRESSION: 1. Acute pancreatitis. Underlying developing mass of the uncinate process not excluded. When the patient is clinically stable and able to follow directions and hold their breath (preferably as an outpatient) further evaluation with dedicated MRI pancreatic protocol should be considered (after acute pancreatitis resolution). 2. Status post cholecystectomy.     MRCP 10/05/23: IMPRESSION: 1. Edema in and around the head of the pancreas extending into the pancreatico duodenal groove and along the descending and proximal transverse duodenum. Confluent smudgy T2 hyperintensity is identified in the uncinate process creating a masslike appearance similar to noted on recent CT. This region shows subtle hyper enhancement relative to background pancreatic parenchyma without evidence for restricted diffusion. Findings are likely related to sequelae of acute pancreatitis although underlying mass lesion is not excluded and continued close follow-up recommended. Endoscopic ultrasound may prove helpful to further evaluate. 2. No evidence for pseudocyst  or abscess. 3. Hepatomegaly with hepatic steatosis. 4. Status post cholecystectomy. No intrahepatic or extrahepatic biliary dilation. MRCP imaging shows no evidence of choledocholithiasis.       RUQ US  10/11/23: IMPRESSION: Hepatic steatosis.  Status post cholecystectomy.     CT abdomen with contrast 10/31/23: PANCREAS: No peripancreatic fluid or inflammatory changes.   CA 19-9 - 11   Trigs 221   Colonoscopy 11/23/23: - Perianal skin tag found on perianal exam. - The examined portion of the ileum was normal. - Diverticulosis in the sigmoid colon. - One 3 mm polyp in the rectum, removed with a cold snare. Resected and retrieved. - Internal hemorrhoids. - The examination was otherwise normal. Hemorrhoids are the cause of rectal bleeding.  FINAL MICROSCOPIC DIAGNOSIS:   A. RECTUM, POLYPECTOMY:  Hyperplastic polyp  Negative for dysplasia and carcinoma     EUS 01/17/24: EGD impression: - No gross lesions in the proximal esophagus and in the mid esophagus. LA Grade A esophagitis with no bleeding noted distally. Z-line irregular, 38 cm from the incisors. - 1 cm hiatal hernia. - A few gastric polyps (consistent with fundic gland polyps). - Erythematous  mucosa in the stomach. Biopsied. - No gross lesions in the duodenal bulb, in the first portion of the duodenum and in the second portion of the duodenum. - Congested major papilla mucosa. Biopsied.  EUS impression: - There was no sign of significant pathology in the pancreatic head, genu of the pancreas, pancreatic body, pancreatic tail. - There was no sign of significant pathology in the common bile duct and in the common hepatic duct. No evidence of choledocholithiasis retainment. - No evidence of ampullary lesion - No malignant-appearing lymph nodes were visualized in the celiac region (level 20), peripancreatic region and porta hepatis region.  - If patient's papillary biopsies returned showing evidence of dysplastic tissue/adenoma, then  endoscopic ampullectomy will need to be discussed with the patient fully. - If tissue is negative for dysplasia, and she continues to have episodes of acute recurrent pancreatitis, consider additional genetic testing workup and referral to [facility]  FINAL MICROSCOPIC DIAGNOSIS:   A. STOMACH, BIOPSY:  Reactive gastropathy  Negative for H. pylori, intestinal metaplasia, dysplasia and carcinoma   B. AMPULLA, BIOPSY:  Reactive duodenal mucosa showing focal gastric metaplasia and changes  compatible with peptic duodenitis    Past Medical History:  Diagnosis Date   Delivery of pregnancy by cesarean section: Indication: arrest of dilation 11/26/2016   Hyperlipemia    Obesity    Pancreatitis    two episodes - 2022 and 2025 - unclear etiology   PCOS (polycystic ovarian syndrome)    Pneumonia    in her 20's   Prolonged first stage (of labor) 11/26/2016     Past Surgical History:  Procedure Laterality Date   BIOPSY  09/09/2021   Procedure: BIOPSY;  Surgeon: Teressa Toribio SQUIBB, MD;  Location: [facility];  Service: Endoscopy;;   CESAREAN SECTION N/A 11/26/2016   Procedure: CESAREAN SECTION;  Surgeon: Kandyce Sor, MD;  Location: [facility];  Service: Obstetrics;  Laterality: N/A;   CHOLECYSTECTOMY N/A 08/12/2014   Procedure: LAPAROSCOPIC CHOLECYSTECTOMY WITH INTRAOPERATIVE CHOLANGIOGRAM;  Surgeon: Camellia CHRISTELLA Blush, MD;  Location: [facility];  Service: General;  Laterality: N/A;   COLONOSCOPY WITH PROPOFOL  N/A 11/23/2023   Procedure: COLONOSCOPY WITH PROPOFOL ;  Surgeon: Leigh Elspeth SQUIBB, MD;  Location: [facility];  Service: Gastroenterology;  Laterality: N/A;   ESOPHAGOGASTRODUODENOSCOPY N/A 01/17/2024   Procedure: EGD (ESOPHAGOGASTRODUODENOSCOPY);  Surgeon: Wilhelmenia Aloha Raddle., MD;  Location: [facility];  Service: Gastroenterology;  Laterality: N/A;   ESOPHAGOGASTRODUODENOSCOPY (EGD) WITH PROPOFOL  N/A 09/09/2021   Procedure: ESOPHAGOGASTRODUODENOSCOPY (EGD) WITH PROPOFOL ;  Surgeon:  Teressa Toribio SQUIBB, MD;  Location: [facility];  Service: Endoscopy;  Laterality: N/A;   EUS N/A 01/17/2024   Procedure: ULTRASOUND, UPPER GI TRACT, ENDOSCOPIC;  Surgeon: Wilhelmenia Aloha Raddle., MD;  Location: [facility];  Service: Gastroenterology;  Laterality: N/A;   TONSILLECTOMY AND ADENOIDECTOMY     WISDOM TOOTH EXTRACTION     Family History  Problem Relation Age of Onset   Heart disease Mother    Diverticulitis Mother    Colon polyps Father    Seizures Sister    Diverticulitis Sister    Mental illness Paternal Grandmother    Colon cancer Neg Hx    Esophageal cancer Neg Hx    Social History   Tobacco Use   Smoking status: Former    Current packs/day: 0.00    Types: Cigarettes    Quit date: 07/30/2009    Years since quitting: 14.5   Smokeless tobacco: Never  Vaping Use   Vaping status: Never Used  Substance Use Topics  Alcohol use: Yes    Alcohol/week: 0.0 standard drinks of alcohol    Comment: OCCASIONAL   Drug use: No   Current Outpatient Medications  Medication Sig Dispense Refill   colestipol  (COLESTID ) 1 g tablet TAKE 1 TABLET BY MOUTH 2 TIMES DAILY. 60 tablet 1   escitalopram (LEXAPRO) 10 MG tablet Take 10 mg by mouth daily. Takes every other day     hydrOXYzine (ATARAX) 10 MG tablet Take 10 mg by mouth at bedtime as needed (sleep).     ondansetron  (ZOFRAN -ODT) 4 MG disintegrating tablet Take 1 tablet (4 mg total) by mouth every 8 (eight) hours as needed for nausea or vomiting. 20 tablet 0   pantoprazole  (PROTONIX ) 40 MG tablet Take 1 tablet (40 mg total) by mouth daily. 30 tablet 6   Vitamin D , Ergocalciferol , (DRISDOL ) 1.25 MG (50000 UNIT) CAPS capsule Take 1 capsule (50,000 Units total) by mouth every 7 (seven) days. (Patient not taking: Reported on 02/28/2024) 4 capsule 12   No current facility-administered medications for this visit.   No Known Allergies   Review of Systems: All systems reviewed and negative except where noted in HPI.    No results  found.  Physical Exam: Pulse 69   Ht 5' 4 (1.626 m)   Wt (!) 308 lb (139.7 kg)   BMI 52.87 kg/m  Constitutional: Pleasant,well-developed, female in no acute distress. Perianal exam - CMA Madison Favre as standby - posterior midline anal canal fissure, skin tags, hemnorroids noted Neurological: Alert and oriented to person place and time. Psychiatric: Normal mood and affect. Behavior is normal.   ASSESSMENT: 43 y.o. female here for assessment of the following  1. Rectal bleeding   2. Anal fissure   3. Grade I hemorrhoids   4. Diarrhea, unspecified type   5. LUQ pain   6. History of pancreatitis   7. Nausea without vomiting    As above, recent colonoscopy showed internal hemorrhoids as a likely cause of her symptoms.  She has developed some rectal pain more recently.  The plan was to perform hemorrhoid banding today however on exam she is developed a posterior midline anal fissure.  As such I do not think is a good time to be performing hemorrhoid banding.  Recommend topical diltiazem ointment, applied PR 3 times daily for the next few weeks and we can bring her back in the office in the next 1 to 2 months for reassessment and banding as appropriate.  She does have internal hemorrhoids which I think have caused her prior symptoms and she definitely has swelling and symptoms from that which banding would benefit.  We discussed risk benefits of banding and she understands, hopefully we can do this at the next visit.  Otherwise she continues to have some loose stools that bother her.  Colonoscopy unremarkable, at the time of the exam I did not realize that those symptoms had persisted, biopsies were not taken.  Colestipol  has not really helped her too much so can stop that.  I would like to check her stool for pancreatic fecal elastase in light of this symptom and her upper abdominal pain, ensure no insufficiency in light of her history of pancreatitis.  In the interim we will start Imodium  1 tab  daily and she can titrate up as needed and we will see if that helps.  She continues to have these episodes of upper abdominal discomfort that can radiate into her back, albeit mostly in the left upper quadrant and a bit  atypical from her prior pancreatitis symptoms.  Described as above.  She just had symptoms yesterday and today, will check LFTs and lipase to make sure stable.  Continue Protonix  for now and will also prescribe some Zofran  to use once or twice daily as needed for nausea.  Otherwise on Lexapro for anxiety, she is having some intolerance to this and backing off the dose.  She may want to talk with her primary care about starting Cymbalta to treat that issue which may also help with some of her pain and bowel issues as well.  She should keep me posted on her progress   PLAN: - topical diltiazem / lidocaine  ointment - pea sized amount PR TID for 4 weeks or until healed - try immodium 1 tab daily and titrate up as needed - stool for pancreatic fecal elastase - lab today - lipase, LFTs - take Zofran  schedule once to twice daily - refill if needed - continue protonix  40mg  / day - consider cymbalta for anxiety / pain as she comes off lexapro - discuss with pcp - f/u 2 months or so for banding / reassessment  Marcey Naval, MD [facility]

## 2024-02-28 NOTE — Patient Instructions (Addendum)
 We have sent a prescription for Diltiazem gel with Lidocaine  to [facility] to treat your anal fissure. You should apply a pea size amount to your rectum three times daily for several weeks or until healed  [facility] information is below: Address: [address], [place], [place] [address]  [phone]  *Please DO NOT go directly from our office to pick up this medication! Give the pharmacy 1 day to process the prescription as this is compounded and takes time to make.  ____________________________________________________________________________________  Please go to the lab in the basement of our building to have lab work done as you leave today. Hit B for basement when you get on the elevator.  When the doors open the lab is on your left.  We will call you with the results. Thank you.  Please purchase the following medications over the counter and take as directed:  Imodium  : Take one tablet daily and titrate up as needed  Take Zofran  on a schedule, once to twice a day (we sent a script to [facility] )  Continue Protonix  daily  Please discuss starting CYMBALTA with your primary care provider.  Thank you for entrusting me with your care and for choosing [facility], Dr. Elspeth Naval    _______________________________________________________  If your blood pressure at your visit was 140/90 or greater, please contact your primary care physician to follow up on this.  _______________________________________________________  If you are age 19 or older, your body mass index should be between 23-30. Your Body mass index is 52.87 kg/m. If this is out of the aforementioned range listed, please consider follow up with your Primary Care Provider.  If you are age 65 or younger, your body mass index should be between 19-25. Your Body mass index is 52.87 kg/m. If this is out of the aformentioned range listed, please consider follow up with  your Primary Care Provider.   ________________________________________________________  The [facility] providers would like to encourage you to use MYCHART to communicate with providers for non-urgent requests or questions.  Due to long hold times on the telephone, sending your provider a message by [facility] may be a faster and more efficient way to get a response.  Please allow 48 business hours for a response.  Please remember that this is for non-urgent requests.  _______________________________________________________  [facility] is using a team-based approach to care.  Your team is made up of your doctor and two to three APPS. Our APPS (Nurse Practitioners and Physician Assistants) work with your physician to ensure care continuity for you. They are fully qualified to address your health concerns and develop a treatment plan. They communicate directly with your gastroenterologist to care for you. Seeing the Advanced Practice Practitioners on your physician's team can help you by facilitating care more promptly, often allowing for earlier appointments, access to diagnostic testing, procedures, and other specialty referrals.

## 2024-02-29 ENCOUNTER — Other Ambulatory Visit

## 2024-02-29 DIAGNOSIS — Z8719 Personal history of other diseases of the digestive system: Secondary | ICD-10-CM

## 2024-02-29 DIAGNOSIS — R197 Diarrhea, unspecified: Secondary | ICD-10-CM

## 2024-02-29 DIAGNOSIS — R1012 Left upper quadrant pain: Secondary | ICD-10-CM

## 2024-02-29 DIAGNOSIS — R110 Nausea: Secondary | ICD-10-CM

## 2024-03-07 LAB — PANCREATIC ELASTASE, FECAL: Pancreatic Elastase-1, Stool: >800 ug/g (ref >200)

## 2024-04-12 MED ORDER — AMBULATORY NON FORMULARY MEDICATION: 0 refills | Status: DC

## 2024-05-06 ENCOUNTER — Encounter (HOSPITAL_COMMUNITY): Payer: Self-pay | Admitting: Orthopedic Surgery

## 2024-05-06 ENCOUNTER — Other Ambulatory Visit: Payer: Self-pay

## 2024-05-06 NOTE — Progress Notes (Signed)
 SDW CALL  Patient was given pre-op instructions over the phone. The opportunity was given for the patient to ask questions. No further questions asked. Patient verbalized understanding of instructions given.   PCP - Darice Gunner Cardiologist - denies  PPM/ICD - denies   Chest x-ray - denies EKG - denies Stress Test - denies ECHO - denies Cardiac Cath - denies  Sleep Study - denies  No DM  Last dose of GLP1 agonist-  n/a GLP1 instructions:  n/a  Blood Thinner Instructions: n/a Aspirin Instructions: n/a  ERAS Protcol - clears until 1030 PRE-SURGERY Ensure or G2-  n/a  COVID TEST- n/a   Anesthesia review: no  Patient denies shortness of breath, fever, cough and chest pain over the phone call   All instructions explained to the patient, with a verbal understanding of the material. Patient agrees to go over the instructions while at home for a better understanding.

## 2024-05-07 ENCOUNTER — Encounter (HOSPITAL_COMMUNITY)
Admission: RE | Disposition: A | Discharge status: Home / Self Care | Payer: Self-pay | Source: Home / Self Care | Attending: Orthopedic Surgery

## 2024-05-07 ENCOUNTER — Ambulatory Visit (HOSPITAL_COMMUNITY): Payer: Self-pay | Admitting: Anesthesiology

## 2024-05-07 ENCOUNTER — Ambulatory Visit (HOSPITAL_BASED_OUTPATIENT_CLINIC_OR_DEPARTMENT_OTHER): Payer: Self-pay | Admitting: Anesthesiology

## 2024-05-07 ENCOUNTER — Ambulatory Visit (HOSPITAL_COMMUNITY)
Admission: RE | Admit: 2024-05-07 | Discharge: 2024-05-07 | Disposition: A | Discharge status: Home / Self Care | Attending: Orthopedic Surgery | Admitting: Orthopedic Surgery

## 2024-05-07 ENCOUNTER — Encounter (HOSPITAL_COMMUNITY): Payer: Self-pay | Admitting: Orthopedic Surgery

## 2024-05-07 DIAGNOSIS — E6689 Other obesity not elsewhere classified: Secondary | ICD-10-CM | POA: Diagnosis not present

## 2024-05-07 DIAGNOSIS — R2232 Localized swelling, mass and lump, left upper limb: Secondary | ICD-10-CM | POA: Diagnosis present

## 2024-05-07 DIAGNOSIS — D4819 Other specified neoplasm of uncertain behavior of connective and other soft tissue: Secondary | ICD-10-CM | POA: Insufficient documentation

## 2024-05-07 DIAGNOSIS — K219 Gastro-esophageal reflux disease without esophagitis: Secondary | ICD-10-CM | POA: Insufficient documentation

## 2024-05-07 DIAGNOSIS — Z87891 Personal history of nicotine dependence: Secondary | ICD-10-CM | POA: Diagnosis not present

## 2024-05-07 DIAGNOSIS — Z6841 Body Mass Index (BMI) 40.0 and over, adult: Secondary | ICD-10-CM | POA: Insufficient documentation

## 2024-05-07 HISTORY — DX: Anxiety disorder, unspecified: F41.9

## 2024-05-07 HISTORY — DX: Anemia, unspecified: D64.9

## 2024-05-07 HISTORY — DX: Nausea with vomiting, unspecified: R11.2

## 2024-05-07 HISTORY — PX: EXCISION, MASS, UPPER EXTREMITY: SHX7567

## 2024-05-07 HISTORY — DX: Personal history of other diseases of the digestive system: Z87.19

## 2024-05-07 HISTORY — PX: HAND SURGERY: SHX662

## 2024-05-07 HISTORY — DX: Unspecified osteoarthritis, unspecified site: M19.90

## 2024-05-07 LAB — POCT PREGNANCY, URINE: Preg Test, Ur: NEGATIVE

## 2024-05-07 SURGERY — EXCISION, MASS, UPPER EXTREMITY
Anesthesia: Regional | Laterality: Left

## 2024-05-07 MED ORDER — ONDANSETRON HCL 4 MG/2ML IJ SOLN: 4.0000 mg | Freq: Once | INTRAMUSCULAR | Status: DC | PRN

## 2024-05-07 MED ORDER — FENTANYL CITRATE (PF) 100 MCG/2ML IJ SOLN: 25.0000 ug | INTRAMUSCULAR | Status: DC | PRN

## 2024-05-07 MED ORDER — ACETAMINOPHEN 500 MG PO TABS
1000.0000 mg | ORAL_TABLET | Freq: Once | ORAL | Status: AC
  Administered 2024-05-07: 1000 mg via ORAL
  Filled 2024-05-07: qty 2

## 2024-05-07 MED ORDER — CEFAZOLIN SODIUM-DEXTROSE 3-4 GM/150ML-% IV SOLN
3.0000 g | INTRAVENOUS | Status: AC
  Administered 2024-05-07: 3 g via INTRAVENOUS
  Filled 2024-05-07: qty 150

## 2024-05-07 MED ORDER — MIDAZOLAM HCL 2 MG/2ML IJ SOLN
INTRAMUSCULAR | Status: AC
  Administered 2024-05-07: 2 mg via INTRAVENOUS
  Filled 2024-05-07: qty 2

## 2024-05-07 MED ORDER — ONDANSETRON HCL 4 MG/2ML IJ SOLN
INTRAMUSCULAR | Status: DC | PRN
  Administered 2024-05-07: 4 mg via INTRAVENOUS

## 2024-05-07 MED ORDER — CHLORHEXIDINE GLUCONATE 0.12 % MT SOLN
15.0000 mL | Freq: Once | OROMUCOSAL | Status: AC
  Administered 2024-05-07: 15 mL via OROMUCOSAL
  Filled 2024-05-07: qty 15

## 2024-05-07 MED ORDER — BUPIVACAINE HCL (PF) 0.25 % IJ SOLN
INTRAMUSCULAR | Status: AC
  Filled 2024-05-07: qty 10

## 2024-05-07 MED ORDER — LIDOCAINE HCL (CARDIAC) PF 100 MG/5ML IV SOSY
PREFILLED_SYRINGE | INTRAVENOUS | Status: DC | PRN
  Administered 2024-05-07: 100 mg via INTRAVENOUS

## 2024-05-07 MED ORDER — BUPIVACAINE HCL (PF) 0.25 % IJ SOLN
INTRAMUSCULAR | Status: AC
  Filled 2024-05-07: qty 30

## 2024-05-07 MED ORDER — SODIUM CHLORIDE 0.9 % IR SOLN
Status: DC | PRN
  Administered 2024-05-07: 1000 mL

## 2024-05-07 MED ORDER — MIDAZOLAM HCL 5 MG/5ML IJ SOLN
INTRAMUSCULAR | Status: DC | PRN
  Administered 2024-05-07: 2 mg via INTRAVENOUS

## 2024-05-07 MED ORDER — DEXMEDETOMIDINE HCL IN NACL 200 MCG/50ML IV SOLN
INTRAVENOUS | Status: DC | PRN
  Administered 2024-05-07: 16 ug via INTRAVENOUS

## 2024-05-07 MED ORDER — PROPOFOL 500 MG/50ML IV EMUL
INTRAVENOUS | Status: DC | PRN
  Administered 2024-05-07: 200 ug/kg/min via INTRAVENOUS

## 2024-05-07 MED ORDER — CLONIDINE HCL (ANALGESIA) 100 MCG/ML EP SOLN
EPIDURAL | Status: DC | PRN
  Administered 2024-05-07: 50 ug

## 2024-05-07 MED ORDER — LACTATED RINGERS IV SOLN: INTRAVENOUS | Status: DC

## 2024-05-07 MED ORDER — ORAL CARE MOUTH RINSE: 15.0000 mL | Freq: Once | OROMUCOSAL | Status: AC

## 2024-05-07 MED ORDER — FENTANYL CITRATE (PF) 100 MCG/2ML IJ SOLN
INTRAMUSCULAR | Status: AC
  Administered 2024-05-07: 50 ug via INTRAVENOUS
  Filled 2024-05-07: qty 2

## 2024-05-07 MED ORDER — DEXMEDETOMIDINE HCL IN NACL 80 MCG/20ML IV SOLN
INTRAVENOUS | Status: AC
  Filled 2024-05-07: qty 20

## 2024-05-07 MED ORDER — ROPIVACAINE HCL 5 MG/ML IJ SOLN
INTRAMUSCULAR | Status: DC | PRN
  Administered 2024-05-07: 30 mL via PERINEURAL

## 2024-05-07 MED ORDER — MIDAZOLAM HCL 2 MG/2ML IJ SOLN
INTRAMUSCULAR | Status: AC
  Filled 2024-05-07: qty 2

## 2024-05-07 MED ORDER — FENTANYL CITRATE (PF) 100 MCG/2ML IJ SOLN: 50.0000 ug | Freq: Once | INTRAMUSCULAR | Status: AC

## 2024-05-07 MED ORDER — MIDAZOLAM HCL (PF) 2 MG/2ML IJ SOLN: 2.0000 mg | Freq: Once | INTRAMUSCULAR | Status: AC

## 2024-05-07 SURGICAL SUPPLY — 42 items
BAG COUNTER SPONGE SURGICOUNT (BAG) ×1 IMPLANT
BNDG COHESIVE 1X5 TAN STRL LF (GAUZE/BANDAGES/DRESSINGS) IMPLANT
BNDG ELASTIC 4INX 5YD STR LF (GAUZE/BANDAGES/DRESSINGS) IMPLANT
BNDG GAUZE DERMACEA FLUFF 4 (GAUZE/BANDAGES/DRESSINGS) IMPLANT
CORD BIPOLAR FORCEPS 12FT (ELECTRODE) ×1 IMPLANT
COVER SURGICAL LIGHT HANDLE (MISCELLANEOUS) ×1 IMPLANT
CUFF TOURN SGL QUICK 18X4 (TOURNIQUET CUFF) ×1 IMPLANT
CUFF TRNQT CYL 24X4X16.5-23 (TOURNIQUET CUFF) IMPLANT
DRAPE SURG 17X23 STRL (DRAPES) ×1 IMPLANT
DRSG EMULSION OIL 3X3 NADH (GAUZE/BANDAGES/DRESSINGS) IMPLANT
DRSG XEROFORM 1X8 (GAUZE/BANDAGES/DRESSINGS) IMPLANT
GAUZE SPONGE 2X2 8PLY STRL LF (GAUZE/BANDAGES/DRESSINGS) IMPLANT
GAUZE SPONGE 4X4 12PLY STRL (GAUZE/BANDAGES/DRESSINGS) IMPLANT
GAUZE XEROFORM 1X8 LF (GAUZE/BANDAGES/DRESSINGS) IMPLANT
GLOVE BIOGEL M 8.0 STRL (GLOVE) ×1 IMPLANT
GLOVE SS BIOGEL STRL SZ 8 (GLOVE) ×1 IMPLANT
GOWN STRL REUS W/ TWL LRG LVL3 (GOWN DISPOSABLE) ×2 IMPLANT
GOWN STRL REUS W/ TWL XL LVL3 (GOWN DISPOSABLE) ×3 IMPLANT
KIT BASIN OR (CUSTOM PROCEDURE TRAY) ×1 IMPLANT
KIT TURNOVER KIT B (KITS) ×1 IMPLANT
LOOP VASCLR MAXI BLUE 18IN ST (MISCELLANEOUS) IMPLANT
MANIFOLD NEPTUNE II (INSTRUMENTS) ×1 IMPLANT
NDL HYPO 25GX1X1/2 BEV (NEEDLE) IMPLANT
NEEDLE HYPO 25GX1X1/2 BEV (NEEDLE) IMPLANT
PACK ORTHO EXTREMITY (CUSTOM PROCEDURE TRAY) ×1 IMPLANT
PAD ARMBOARD POSITIONER FOAM (MISCELLANEOUS) ×2 IMPLANT
PAD CAST 4YDX4 CTTN HI CHSV (CAST SUPPLIES) IMPLANT
SLING ARM FOAM STRAP XLG (SOFTGOODS) IMPLANT
SOL PREP POV-IOD 4OZ 10% (MISCELLANEOUS) ×4 IMPLANT
SOLN 0.9% NACL POUR BTL 1000ML (IV SOLUTION) ×1 IMPLANT
SOLN STERILE WATER BTL 1000 ML (IV SOLUTION) ×1 IMPLANT
SPEAR EYE SURG WECK-CEL (MISCELLANEOUS) IMPLANT
SPIKE FLUID TRANSFER (MISCELLANEOUS) ×1 IMPLANT
SUT MERSILENE 4 0 P 3 (SUTURE) IMPLANT
SUT PROLENE 4 0 P 3 18 (SUTURE) IMPLANT
SUT PROLENE 4 0 PS 2 18 (SUTURE) IMPLANT
SUT VIC AB 2-0 CT1 TAPERPNT 27 (SUTURE) IMPLANT
SYR CONTROL 10ML LL (SYRINGE) IMPLANT
TOWEL GREEN STERILE (TOWEL DISPOSABLE) ×1 IMPLANT
TOWEL GREEN STERILE FF (TOWEL DISPOSABLE) ×1 IMPLANT
TUBE CONNECTING 12X1/4 (SUCTIONS) IMPLANT
UNDERPAD 30X36 HEAVY ABSORB (UNDERPADS AND DIAPERS) ×1 IMPLANT

## 2024-05-07 NOTE — Anesthesia Procedure Notes (Signed)
 Anesthesia Regional Block: Axillary brachial plexus block   Pre-Anesthetic Checklist: , timeout performed,  Correct Patient, Correct Site, Correct Laterality,  Correct Procedure, Correct Position, site marked,  Risks and benefits discussed,  Surgical consent,  Pre-op evaluation,  At surgeon's request and post-op pain management  Laterality: Left  Prep: chloraprep       Needles:  Injection technique: Single-shot  Needle Type: Echogenic Needle     Needle Length: 9cm  Needle Gauge: 21     Additional Needles:   Procedures:,,,, ultrasound used (permanent image in chart),,    Narrative:  Start time: 05/07/2024 12:41 PM End time: 05/07/2024 12:47 PM Injection made incrementally with aspirations every 5 mL.  Performed by: Personally  Anesthesiologist: Corinne Garnette BRAVO, MD  Additional Notes: No pain on injection. No increased resistance to injection. Injection made in 5cc increments.  Good needle visualization.  Patient tolerated procedure well.

## 2024-05-07 NOTE — Anesthesia Preprocedure Evaluation (Addendum)
 Anesthesia Evaluation  Patient identified by MRN, date of birth, ID band Patient awake    Reviewed: Allergy & Precautions, NPO status , Patient's Chart, lab work & pertinent test results  History of Anesthesia Complications (+) PONV and history of anesthetic complications  Airway Mallampati: III  TM Distance: >3 FB Neck ROM: Full    Dental  (+) Teeth Intact, Dental Advisory Given   Pulmonary former smoker   Pulmonary exam normal breath sounds clear to auscultation       Cardiovascular negative cardio ROS Normal cardiovascular exam Rhythm:Regular Rate:Normal     Neuro/Psych  PSYCHIATRIC DISORDERS Anxiety     negative neurological ROS     GI/Hepatic Neg liver ROS,GERD  Medicated,,  Endo/Other    Class 4 obesity  Renal/GU negative Renal ROS     Musculoskeletal  (+) Arthritis ,  Mass of finger of left hand   Abdominal   Peds  Hematology negative hematology ROS (+)   Anesthesia Other Findings Day of surgery medications reviewed with the patient.  Reproductive/Obstetrics                              Anesthesia Physical Anesthesia Plan  ASA: 4  Anesthesia Plan: Regional   Post-op Pain Management: Tylenol  PO (pre-op)*   Induction: Intravenous  PONV Risk Score and Plan: 3 and Midazolam , TIVA, Dexamethasone  and Ondansetron   Airway Management Planned: Natural Airway and Simple Face Mask  Additional Equipment:   Intra-op Plan:   Post-operative Plan:   Informed Consent: I have reviewed the patients History and Physical, chart, labs and discussed the procedure including the risks, benefits and alternatives for the proposed anesthesia with the patient or authorized representative who has indicated his/her understanding and acceptance.     Dental advisory given  Plan Discussed with: CRNA  Anesthesia Plan Comments:         Anesthesia Quick Evaluation

## 2024-05-07 NOTE — Discharge Instructions (Signed)
 We recommend that you to take vitamin C 1000 mg a day to promote healing. We also recommend that if you require  pain medicine that you take a stool softener to prevent constipation as most pain medicines will have constipation side effects. We recommend either Peri-Colace or Senokot and recommend that you also consider adding MiraLAX as well to prevent the constipation affects from pain medicine if you are required to use them. These medicines are over the counter and may be purchased at a local pharmacy. A cup of yogurt and a probiotic can also be helpful during the recovery process as the medicines can disrupt your intestinal environment. Keep bandage clean and dry.  Call for any problems.  No smoking.  Criteria for driving a car: you should be off your pain medicine for 7-8 hours, able to drive one handed(confident), thinking clearly and feeling able in your judgement to drive. Continue elevation as it will decrease swelling.  If instructed by MD move your fingers within the confines of the bandage/splint.  Use ice if instructed by your MD. Call immediately for any sudden loss of feeling in your hand/arm or change in functional abilities of the extremity.

## 2024-05-07 NOTE — Op Note (Signed)
 Operative note May 07, 2024  Date of dictation operation May 07, 2024  Alexis Howell.  Preoperative diagnosis third webspace left hand mass  Postop diagnosis same procedure   Procedure: Excision greater than 1 cm deep mass left hand with neuroplasty ulnar digital nerve to the middle finger extensive in nature  Benjerman Molinelli MD  Anesthesia Block with IV sedation.  Tourniquet time less than an hour.  Operation detail: Patient was seen by myself and anesthesia taken to the operative theater prepped and draped with Hibiclens  scrub followed by Betadine scrub and paint following this arms elevated tourniquet was insufflated to 150 mmHg and a modified Bruner incision was made about the third webspace of the left hand dissection was carried down ulnar digital nerve to the middle finger was identified and underwent a neuroplasty/neurolysis as it was heavily adjacent to the mass in question.  I mobilized the neurovascular bundle and performed a neuroplasty neurolysis followed by circumferential dissection of the mass and excision.  The mass had investments about the lateral band apparatus.  We very carefully removed it.  Following removal of the mass we then performed irrigation.  I took photos of the mass and gave a photocopy to the patient's family.  There were no complications.  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  The patient tolerated this well.  Irrigation was applied tourniquet deflated and wound closed with Prolene.  All questions have been encouraged and answered.  Will see her back in the office in a week for dressing change 2 weeks for suture removal.  There were no complications.  They have my cell phone for any issues problems or concerns.  Alexis Mulkern MD

## 2024-05-07 NOTE — Transfer of Care (Signed)
 Immediate Anesthesia Transfer of Care Note  Patient: Alexis Howell   Procedure(s) Performed: EXCISION, MASS, UPPER EXTREMITY (Left)  Patient Location: PACU  Anesthesia Type:MAC combined with regional for post-op pain  Level of Consciousness: awake, alert , and oriented  Airway & Oxygen Therapy: Patient Spontanous Breathing  Post-op Assessment: Report given to RN and Post -op Vital signs reviewed and stable  Post vital signs: Reviewed and stable  Last Vitals:  Vitals Value Taken Time  BP 142/97 05/07/24 14:30  Temp 36.4 C 05/07/24 14:30  Pulse 73 05/07/24 14:35  Resp 23 05/07/24 14:35  SpO2 93 % 05/07/24 14:35  Vitals shown include unfiled device data.  Last Pain:  Vitals:   05/07/24 1430  PainSc: 0-No pain      Patients Stated Pain Goal: 4 (05/07/24 1135)  Complications: No notable events documented.

## 2024-05-07 NOTE — H&P (Signed)
 Alexis Howell  is an 43 y.o. female.   Chief Complaint: mass left hand HPI: Patient presents for evaluation and treatment of the of their upper extremity predicament. The patient denies neck, back, chest or  abdominal pain. The patient notes that they have no lower extremity problems. The patients primary complaint is noted. We are planning surgical care pathway for the upper extremity.   Past Medical History:  Diagnosis Date  . Anemia   . Anxiety   . Arthritis   . Delivery of pregnancy by cesarean section: Indication: arrest of dilation 11/26/2016  . History of hiatal hernia   . Hyperlipemia   . Obesity   . Pancreatitis    two episodes - 2022 and 2025 - unclear etiology  . PCOS (polycystic ovarian syndrome)   . Pneumonia    in her 20's  . PONV (postoperative nausea and vomiting)   . Prolonged first stage (of labor) 11/26/2016    Past Surgical History:  Procedure Laterality Date  . BIOPSY  09/09/2021   Procedure: BIOPSY;  Surgeon: Teressa Toribio SQUIBB, MD;  Location: [facility];  Service: Endoscopy;;  . CESAREAN SECTION N/A 11/26/2016   Procedure: CESAREAN SECTION;  Surgeon: Kandyce Sor, MD;  Location: [facility];  Service: Obstetrics;  Laterality: N/A;  . CHOLECYSTECTOMY N/A 08/12/2014   Procedure: LAPAROSCOPIC CHOLECYSTECTOMY WITH INTRAOPERATIVE CHOLANGIOGRAM;  Surgeon: Camellia CHRISTELLA Blush, MD;  Location: [facility];  Service: General;  Laterality: N/A;  . COLONOSCOPY WITH PROPOFOL  N/A 11/23/2023   Procedure: COLONOSCOPY WITH PROPOFOL ;  Surgeon: Leigh Elspeth SQUIBB, MD;  Location: [facility];  Service: Gastroenterology;  Laterality: N/A;  . ESOPHAGOGASTRODUODENOSCOPY N/A 01/17/2024   Procedure: EGD (ESOPHAGOGASTRODUODENOSCOPY);  Surgeon: Wilhelmenia Aloha Raddle., MD;  Location: [facility];  Service: Gastroenterology;  Laterality: N/A;  . ESOPHAGOGASTRODUODENOSCOPY (EGD) WITH PROPOFOL  N/A 09/09/2021   Procedure: ESOPHAGOGASTRODUODENOSCOPY (EGD) WITH PROPOFOL ;  Surgeon: Teressa Toribio SQUIBB, MD;  Location: [facility];  Service: Endoscopy;  Laterality: N/A;  . EUS N/A 01/17/2024   Procedure: ULTRASOUND, UPPER GI TRACT, ENDOSCOPIC;  Surgeon: Wilhelmenia, Aloha Raddle., MD;  Location: [facility];  Service: Gastroenterology;  Laterality: N/A;  . TONSILLECTOMY AND ADENOIDECTOMY    . WISDOM TOOTH EXTRACTION      Family History  Problem Relation Age of Onset  . Heart disease Mother   . Diverticulitis Mother   . Colon polyps Father   . Seizures Sister   . Diverticulitis Sister   . Mental illness Paternal Grandmother   . Colon cancer Neg Hx   . Esophageal cancer Neg Hx    Social History:  reports that she quit smoking about 14 years ago. Her smoking use included cigarettes. She has never used smokeless tobacco. She reports current alcohol use. She reports that she does not use drugs.  Allergies: No Known Allergies  Medications Prior to Admission  Medication Sig Dispense Refill  . AMBULATORY NON FORMULARY MEDICATION Diltiazem gel with 2% lidocaine   Apply a pea sized amount into your rectum three times daily for 4 weeks or until healed Dispense 30 GM zero refill (Patient taking differently: Place 1 Application rectally in the morning and at bedtime. Diltiazem gel with 2% lidocaine   Apply a pea sized amount into your rectum twice daily  Dispense 30 GM zero refill) 30 g 0  . Ascorbic Acid (VITAMIN C PO) Take 1 tablet by mouth daily as needed (immune system boost).    . hydrOXYzine (ATARAX) 25 MG tablet Take 25-50 mg by mouth 2 (two) times daily as needed  for anxiety (sleep).    . Multiple Vitamins-Minerals (ZINC PO) Take 1 tablet by mouth daily as needed (immune system boost).    . ondansetron  (ZOFRAN -ODT) 4 MG disintegrating tablet Take 1 tablet (4 mg total) by mouth every 8 (eight) hours as needed for nausea or vomiting. Take once or twice a day on a schedule 60 tablet 5  . pantoprazole  (PROTONIX ) 40 MG tablet Take 1 tablet (40 mg total) by mouth daily. (Patient taking  differently: Take 40 mg by mouth every evening.) 30 tablet 5    Results for orders placed or performed during the hospital encounter of 05/07/24 (from the past 48 hours)  Pregnancy, urine POC     Status: None   Collection Time: 05/07/24 12:02 PM  Result Value Ref Range   Preg Test, Ur NEGATIVE NEGATIVE    Comment:        THE SENSITIVITY OF THIS METHODOLOGY IS >20 mIU/mL.    No results found.  Review of Systems  HENT: Negative.    Respiratory: Negative.    Gastrointestinal: Negative.   Genitourinary: Negative.     Blood pressure 115/84, pulse 81, temperature 98.2 F (36.8 C), resp. rate 18, height 5' 4 (1.626 m), weight (!) 137 kg, last menstrual period 05/03/2024, SpO2 99%. Physical Exam left hand mass 1-2 cm plan excision  The patient is alert and oriented in no acute distress. The patient complains of pain in the affected upper extremity.  The patient is noted to have a normal HEENT exam. Lung fields show equal chest expansion and no shortness of breath. Abdomen exam is nontender without distention. Lower extremity examination does not show any fracture dislocation or blood clot symptoms. Pelvis is stable and the neck and back are stable and nontender.   Assessment/Plan Plan left hand mass excision  We are planning surgery for your upper extremity. The risk and benefits of surgery to include risk of bleeding, infection, anesthesia,  damage to normal structures and failure of the surgery to accomplish its intended goals of relieving symptoms and restoring function have been discussed in detail. With this in mind we plan to proceed. I have specifically discussed with the patient the pre-and postoperative regime and the dos and don'ts and risk and benefits in great detail. Risk and benefits of surgery also include risk of dystrophy(CRPS), chronic nerve pain, failure of the healing process to go onto completion and other inherent risks of surgery The relavent the pathophysiology  of the disease/injury process, as well as the alternatives for treatment and postoperative course of action has been discussed in great detail with the patient who desires to proceed.  We will do everything in our power to help you (the patient) restore function to the upper extremity. It is a pleasure to see this patient today.   Elsie CHRISTELLA Mussel III, MD 05/07/2024, 1:40 PM

## 2024-05-07 NOTE — Progress Notes (Signed)
 No pre-op labs needed per Dr. Corinne.   Joesph LOISE Stake, RN

## 2024-05-08 ENCOUNTER — Ambulatory Visit: Admitting: Gastroenterology

## 2024-05-08 ENCOUNTER — Telehealth: Payer: Self-pay

## 2024-05-08 ENCOUNTER — Encounter: Payer: Self-pay | Admitting: Gastroenterology

## 2024-05-08 VITALS — BP 110/70 | HR 85 | Ht 64.0 in | Wt 314.0 lb

## 2024-05-08 DIAGNOSIS — Z8719 Personal history of other diseases of the digestive system: Secondary | ICD-10-CM

## 2024-05-08 DIAGNOSIS — K6289 Other specified diseases of anus and rectum: Secondary | ICD-10-CM | POA: Diagnosis not present

## 2024-05-08 DIAGNOSIS — R1010 Upper abdominal pain, unspecified: Secondary | ICD-10-CM

## 2024-05-08 DIAGNOSIS — R194 Change in bowel habit: Secondary | ICD-10-CM

## 2024-05-08 DIAGNOSIS — K602 Anal fissure, unspecified: Secondary | ICD-10-CM

## 2024-05-08 DIAGNOSIS — N926 Irregular menstruation, unspecified: Secondary | ICD-10-CM

## 2024-05-08 MED ORDER — AMBULATORY NON FORMULARY MEDICATION: 1 refills | Status: DC

## 2024-05-08 NOTE — Telephone Encounter (Signed)
 Faxed referral to [facility] for persistent anal fissure, rectal pain

## 2024-05-08 NOTE — Progress Notes (Signed)
 HPI: 43 year old female with a history of pancreatitis, rectal bleeding / pain, here for a follow-up visit.  She is accompanied by her husband  Main complaint today has been rectal pain.  She had a colonoscopy at the hospital in June where perianal skin tags and hemorrhoids were noted but the rest of her colon was healthy without any concerning lesions.  She then saw me in September for rectal pain and bleeding, was found to have a posterior midline anal fissure and recommended treatment with diltiazem ointment with lidocaine .  She states she has been using this however it has been difficult for her to apply it PR, secondary to pain and skin tag which she states makes it hard to apply.  Her symptoms have been getting worse and she is in a lot of pain in her rectum with bowel movements.  She has not had much bleeding lately.  She denies any constipation or straining.  She has also tried Calmol 4 suppositories which have not helped.  This is the most pressing issue that is bothering her today.   Recall she had a history of pancreatitis in December 2022 without a clear cause.  Unfortunately this past April 2025 she presented with recurrent acute pancreatitis. At the time of that exam her AST and ALT were normal along with normal alk phos and normal total bili.  Triglycerides were normal.  Incidentally her ALT and AST had risen slightly the 70s and 90s during that stay.  She has had a history of intermittently elevated liver enzymes.  She is status post cholecystectomy. On the CT scan there was a question of possible masslike appearance in the uncinate process.  She had an MRCP done on April 17 which suggested this could be more related to sequela of acute pancreatitis although they could not necessarily exclude a mass lesion.  She had a CA 19-9 level that was normal.  She had a repeat CT of her pancreas on May 13 and there is no mass that is seen, it is normal.She had a cholecystectomy in 2018 before this  started.  She has roughly 1-2 alcoholic beverages per month and does not drink on a routine basis.  She is not aware of any family history of pancreatic cancer or pancreatitis.  She denied any new medications around the time of this occurrence.   She had an EUS with Dr. Wilhelmenia on July 30.  This did not show any evidence of abnormalities of her pancreas.  At her last visit she had told me she has been having intermittent upper abdominal pain that wraps into her back around her left side, reminiscent of her prior pancreatitis symptoms.  She has had labs since then showing normal lipase and no evidence of recurrent pancreatitis.  During this time she is also had some loose stools, her colonoscopy looked okay in that regard although I did not realize at the time she had persistent loose stools and biopsies were not taken.  Recall she was previously on Creon which did not really help her too much, gave her some Colestid  empirically which did not really help her stool frequency either.  We discussed taking some Imodium  as needed.  She states over time her loose stools have been much better lately and not too bothersome.  She takes Zofran  as needed actually which helps her.  She continues to have intermittent episodes of pain in her left upper quadrant that wraparound into her back.  Over time since her episode of  pancreatitis, however, the episodes are decreasing in frequency.  She has not had an episode in over a month now and this is the longest stretch she has had.  When she has an episode that tends to last for 1 to 2 weeks and then stops.  She is off all Creon.  She is not drinking much alcohol.  She has been on Protonix  for history of mild esophagitis on EGD, in case related to some of her symptoms.  She continues to take that but denies any reflux symptoms.  She is not taking any NSAIDs.  She otherwise has had unusual menses lately, and she can have abdominal pain in her upper abdomen during her menstrual  cycle.  She is having a menstrual cycle every 2 weeks.  She was seen by gynecology at [facility] but wants another opinion, she questions if she has endometriosis or if she needs further evaluation of that.         PREVIOUS GI WORKUP    Ct ab/pelvis 04/2023 IMPRESSION: No acute findings or other significant abnormality. No radiographic evidence of urinary tract neoplasm, urolithiasis, or hydronephrosis.   CTAP w contrast 07/2021 IMPRESSION: 1. Small lymph nodes scattered within the central mesentery, upper abdomen and retroperitoneum, abnormal by number, but no enlarged lymph nodes present. This mild lymphadenopathy is a nonspecific finding but can be seen in the setting of a mesenteric adenitis or possibly residual reactive lymphadenopathy related to the earlier reported episode of pancreatitis. Recommend follow-up CT in 3-6 months to ensure stability or resolution and exclude the possibility of this representing early lymphoma. Alternatively, could consider nonemergent PET-CT now for further characterization. 2. Remainder of the abdomen and pelvis CT is unremarkable. No bowel obstruction or evidence of bowel wall inflammation. No evidence of pancreatitis. No renal or ureteral calculi. Appendix is normal.   EGD 08/2021: upper abdominal pain - Mild, non- specific gastritis. Biopsied to check for H. pylori.  - The examination was otherwise normal.   FINAL MICROSCOPIC DIAGNOSIS:   A.   STOMACH, BODY AND ANTRUM, BIOPSY:  -    Mild reactive gastropathy.  -    Negative for an inflammatory pattern predictive of Helicobacter  pylori infection.  -    Negative for intestinal metaplasia.  -    Negative for malignancy.        CT abdomen / pelvis 10/04/23: IMPRESSION: 1. Acute pancreatitis. Underlying developing mass of the uncinate process not excluded. When the patient is clinically stable and able to follow directions and hold their breath (preferably as an outpatient) further evaluation  with dedicated MRI pancreatic protocol should be considered (after acute pancreatitis resolution). 2. Status post cholecystectomy.     MRCP 10/05/23: IMPRESSION: 1. Edema in and around the head of the pancreas extending into the pancreatico duodenal groove and along the descending and proximal transverse duodenum. Confluent smudgy T2 hyperintensity is identified in the uncinate process creating a masslike appearance similar to noted on recent CT. This region shows subtle hyper enhancement relative to background pancreatic parenchyma without evidence for restricted diffusion. Findings are likely related to sequelae of acute pancreatitis although underlying mass lesion is not excluded and continued close follow-up recommended. Endoscopic ultrasound may prove helpful to further evaluate. 2. No evidence for pseudocyst or abscess. 3. Hepatomegaly with hepatic steatosis. 4. Status post cholecystectomy. No intrahepatic or extrahepatic biliary dilation. MRCP imaging shows no evidence of choledocholithiasis.       RUQ US  10/11/23: IMPRESSION: Hepatic steatosis.  Status post cholecystectomy.  CT abdomen with contrast 10/31/23: PANCREAS: No peripancreatic fluid or inflammatory changes.   CA 19-9 - 11   Trigs 221     Colonoscopy 11/23/23: - Perianal skin tag found on perianal exam. - The examined portion of the ileum was normal. - Diverticulosis in the sigmoid colon. - One 3 mm polyp in the rectum, removed with a cold snare. Resected and retrieved. - Internal hemorrhoids. - The examination was otherwise normal. Hemorrhoids are the cause of rectal bleeding.   FINAL MICROSCOPIC DIAGNOSIS:   A. RECTUM, POLYPECTOMY:  Hyperplastic polyp  Negative for dysplasia and carcinoma        EUS 01/17/24: EGD impression: - No gross lesions in the proximal esophagus and in the mid esophagus. LA Grade A esophagitis with no bleeding noted distally. Z-line irregular, 38 cm from the incisors. - 1 cm  hiatal hernia. - A few gastric polyps (consistent with fundic gland polyps). - Erythematous mucosa in the stomach. Biopsied. - No gross lesions in the duodenal bulb, in the first portion of the duodenum and in the second portion of the duodenum. - Congested major papilla mucosa. Biopsied.   EUS impression: - There was no sign of significant pathology in the pancreatic head, genu of the pancreas, pancreatic body, pancreatic tail. - There was no sign of significant pathology in the common bile duct and in the common hepatic duct. No evidence of choledocholithiasis retainment. - No evidence of ampullary lesion - No malignant-appearing lymph nodes were visualized in the celiac region (level 20), peripancreatic region and porta hepatis region.   - If patient's papillary biopsies returned showing evidence of dysplastic tissue/adenoma, then endoscopic ampullectomy will need to be discussed with the patient fully. - If tissue is negative for dysplasia, and she continues to have episodes of acute recurrent pancreatitis, consider additional genetic testing workup and referral to [facility]   FINAL MICROSCOPIC DIAGNOSIS:   A. STOMACH, BIOPSY:  Reactive gastropathy  Negative for H. pylori, intestinal metaplasia, dysplasia and carcinoma   B. AMPULLA, BIOPSY:  Reactive duodenal mucosa showing focal gastric metaplasia and changes  compatible with peptic duodenitis      Past Medical History:  Diagnosis Date   Anemia    Anxiety    Arthritis    Delivery of pregnancy by cesarean section: Indication: arrest of dilation 11/26/2016   History of hiatal hernia    Hyperlipemia    Obesity    Pancreatitis    two episodes - 2022 and 2025 - unclear etiology   PCOS (polycystic ovarian syndrome)    Pneumonia    in her 20's   PONV (postoperative nausea and vomiting)    Prolonged first stage (of labor) 11/26/2016     Past Surgical History:  Procedure Laterality Date   BIOPSY  09/09/2021   Procedure: BIOPSY;  Surgeon:  Teressa Toribio SQUIBB, MD;  Location: [facility];  Service: Endoscopy;;   CESAREAN SECTION N/A 11/26/2016   Procedure: CESAREAN SECTION;  Surgeon: Kandyce Sor, MD;  Location: [facility];  Service: Obstetrics;  Laterality: N/A;   CHOLECYSTECTOMY N/A 08/12/2014   Procedure: LAPAROSCOPIC CHOLECYSTECTOMY WITH INTRAOPERATIVE CHOLANGIOGRAM;  Surgeon: Camellia CHRISTELLA Blush, MD;  Location: [facility];  Service: General;  Laterality: N/A;   COLONOSCOPY WITH PROPOFOL  N/A 11/23/2023   Procedure: COLONOSCOPY WITH PROPOFOL ;  Surgeon: Leigh Elspeth SQUIBB, MD;  Location: [facility];  Service: Gastroenterology;  Laterality: N/A;   ESOPHAGOGASTRODUODENOSCOPY N/A 01/17/2024   Procedure: EGD (ESOPHAGOGASTRODUODENOSCOPY);  Surgeon: Wilhelmenia Aloha Raddle., MD;  Location: [facility];  Service:  Gastroenterology;  Laterality: N/A;   ESOPHAGOGASTRODUODENOSCOPY (EGD) WITH PROPOFOL  N/A 09/09/2021   Procedure: ESOPHAGOGASTRODUODENOSCOPY (EGD) WITH PROPOFOL ;  Surgeon: Teressa Toribio SQUIBB, MD;  Location: [facility];  Service: Endoscopy;  Laterality: N/A;   EUS N/A 01/17/2024   Procedure: ULTRASOUND, UPPER GI TRACT, ENDOSCOPIC;  Surgeon: Wilhelmenia Aloha Raddle., MD;  Location: [facility];  Service: Gastroenterology;  Laterality: N/A;   EXCISION, MASS, UPPER EXTREMITY 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   Procedure: EXCISION, MASS, UPPER EXTREMITY;  Surgeon: Camella Fallow, MD;  Location: [facility];  Service: Orthopedics;  Laterality: Left;  Excision left third webspace mass with neuroplasty and repair as necessary   HAND SURGERY 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   giant cell tumor removed   TONSILLECTOMY AND ADENOIDECTOMY     WISDOM TOOTH EXTRACTION     Family History  Problem Relation Age of Onset   Heart disease Mother    Diverticulitis Mother    Colon polyps Father    Seizures Sister    Diverticulitis Sister    Mental illness Paternal Grandmother    Colon cancer Neg Hx    Esophageal cancer Neg Hx    Social History   Tobacco Use   Smoking status:  Former    Current packs/day: 0.00    Types: Cigarettes    Quit date: 07/30/2009    Years since quitting: 14.7   Smokeless tobacco: Never  Vaping Use   Vaping status: Never Used  Substance Use Topics   Alcohol use: Yes    Comment: once a month - 2-3 glasses of wine   Drug use: No   Current Outpatient Medications  Medication Sig Dispense Refill   Ascorbic Acid (VITAMIN C PO) Take 1 tablet by mouth daily as needed (immune system boost).     doxycycline (VIBRAMYCIN) 100 MG capsule Take 100 mg by mouth 2 (two) times daily.     hydrOXYzine (ATARAX) 25 MG tablet Take 25-50 mg by mouth 2 (two) times daily as needed for anxiety (sleep).     ondansetron  (ZOFRAN -ODT) 4 MG disintegrating tablet Take 1 tablet (4 mg total) by mouth every 8 (eight) hours as needed for nausea or vomiting. Take once or twice a day on a schedule 60 tablet 5   oxyCODONE  (OXY IR/ROXICODONE ) 5 MG immediate release tablet Take 5 mg by mouth.     pantoprazole  (PROTONIX ) 40 MG tablet Take 1 tablet (40 mg total) by mouth daily. (Patient taking differently: Take 40 mg by mouth every evening.) 30 tablet 5   No current facility-administered medications for this visit.   No Known Allergies   Review of Systems: All systems reviewed and negative except where noted in HPI.   Lab Results  Component Value Date   WBC 7.7 01/22/2024   HGB 13.4 01/22/2024   HCT 42.7 01/22/2024   MCV 86 01/22/2024   PLT 340 01/22/2024    Lab Results  Component Value Date   NA 136 01/22/2024   CL 100 01/22/2024   K 4.9 01/22/2024   CO2 21 01/22/2024   BUN 14 01/22/2024   CREATININE 0.86 01/22/2024   EGFR 87 01/22/2024   CALCIUM 9.6 01/22/2024   PHOS 3.2 10/06/2023   ALBUMIN 4.5 02/28/2024   GLUCOSE 100 (H) 01/22/2024    Lab Results  Component Value Date   ALT 21 02/28/2024   AST 19 02/28/2024   ALKPHOS 67 02/28/2024   BILITOT 0.5 02/28/2024     Physical Exam: BP 110/70 Comment: large cuff  Pulse 85   Ht 5' 4 (1.626 m)  Wt (!) 314 lb (142.4 kg)   LMP 05/03/2024 (Exact Date)   BMI 53.90 kg/m  Constitutional: Pleasant,well-developed, female in no acute distress. Perianal exam - CMA Patty Jordan standby, posterior midline anal fissure, skin tag, hemorrhoids. Internal DRE not performed secondary to pain. Psychiatric: Normal mood and affect. Behavior is normal.   ASSESSMENT: 43 y.o. female here for assessment of the following  1. Anal fissure   2. Rectal pain   3. History of pancreatitis   4. Altered bowel habits   5. Pain of upper abdomen   6. Abnormal menses    Multiple issues addressed today as outlined.  On exam she has a persistent posterior midline anal fissure which I think is causing her ongoing pain.  She has had difficulty applying the topical diltiazem ointment secondary to the skin tag.  She states she was using it correctly initially but lately has been harder to use secondary to pain.  She is not having any straining or constipation.  Will switch her to topical nitroglycerin, 0.125% ointment, PR 3 times daily as tolerated.  Recommend she use a glycerin  suppository to help apply this if applying it with her finger is too painful.  She is really worried about persistent symptoms and that it is not healing well.  She is also concerned about a skin tag in that area which is irritating her.  I we will refer her to colorectal surgery for further evaluation for persistent anal fissure and rectal pain, hopefully she can improve with conservative measures in the interim, however if not she should keep her visit and follow-up with the surgeons.  Reviewed her history of pancreatitis at length.  She has had 2 discrete episodes documented in the past along with additional episodes of abdominal pain that have been suspected to be related to pancreatitis.  Extensive workup done without clear cause.  US  negative.  She continues to have episodes of upper abdominal pain radiating to her back, however lipase during  recent episodes has been normal.  If she has a severe episode she should let me know and I would repeat labs.  She thinks over time the episodes are becoming less frequent and bothersome.  For now we will monitor, asked her to remain off all alcohol.  If episodes persist and continue to bother her, Dr. Wilhelmenia had previously recommended consideration for SOD testing at [facility].  Can consider that if symptoms persist/recur.  We have kept her on Protonix  empirically given history of esophagitis in case that was related.  Given she seems to improving would continue that for now.  She did have significant bowel dysfunction after her pancreatitis episode but that appears to be resolving with time.  Colonoscopy is up-to-date.  Otherwise has had abnormal menses with cycles every 2 weeks or so.  Requesting a second opinion from gynecologist and asked for recommendations.  Will place a referral for her.  Of note, she does have some pain in her upper abdomen with her cycle and questions if she has endometriosis.  Will defer to GYNfor further eval   PLAN: - switch diltiazem ointment to topical 0.125% nitroglycerin - pea sized amount TID - apply using glycerin  suppository - refer her to colerectal surgery at [facility] for persistent anal fissure, rectal pain - history of idiopathic pancreatitis - still having episodes of pain at times which remind her of prior pancreatitis episodes but decreasing in frequency. Lipase has been normal in these settings recently. Consider referral to [facility] for SOD testing  if persists / worsens, she wants to hold off on that for now. If recurrent pain do labs and check lipase / LFTs - continue protonix  for now - referral to GYN for second opinion for abnormal menses  Marcey Naval, MD [facility]

## 2024-05-08 NOTE — Patient Instructions (Addendum)
 We have sent a prescription for nitroglycerin 0.125% gel to [facility]. You should apply a pea size amount to a glycerin  suppository and insert rectally Three times a day until healed  [facility] information is below: Address: [address], [place], [place] [address]  [phone]  *Please DO NOT go directly from our office to pick up this medication! Give the pharmacy 1 day to process the prescription as this is compounded and takes time to make.   Stop Diltiazem  We are referring you to [facility] for persistent anal fissure and rectal pain.  They will contact you directly to schedule an appointment.  It may take a week or more before you hear from them.  Please feel free to contact us  if you have not heard from them within 2 weeks and we will follow up on the referral.  [facility] ([facility] is located at [address]. Their number is [phone].   Continue Protonix   We recommend [facility] for [facility].  We are referring you and they will contact you directly to schedule an appointment. It may take a week or more before you hear from them.  Please feel free to contact them if you have not heard from them within a week or 2. They can be reached at [phone].  Please let us  know if you have recurrent pain and we can order some lab work.  Thank you for entrusting me with your care and for choosing [facility], Dr. Elspeth Naval    _______________________________________________________  If your blood pressure at your visit was 140/90 or greater, please contact your primary care physician to follow up on this.  _______________________________________________________  If you are age 42 or older, your body mass index should be between 23-30. Your Body mass index is 53.9 kg/m. If this is out of the aforementioned range listed, please consider follow up with your  Primary Care Provider.  If you are age 42 or younger, your body mass index should be between 19-25. Your Body mass index is 53.9 kg/m. If this is out of the aformentioned range listed, please consider follow up with your Primary Care Provider.   ________________________________________________________  The [facility] providers would like to encourage you to use MYCHART to communicate with providers for non-urgent requests or questions.  Due to long hold times on the telephone, sending your provider a message by [facility] may be a faster and more efficient way to get a response.  Please allow 48 business hours for a response.  Please remember that this is for non-urgent requests.  _______________________________________________________  [facility] is using a team-based approach to care.  Your team is made up of your doctor and two to three APPS. Our APPS (Nurse Practitioners and Physician Assistants) work with your physician to ensure care continuity for you. They are fully qualified to address your health concerns and develop a treatment plan. They communicate directly with your gastroenterologist to care for you. Seeing the Advanced Practice Practitioners on your physician's team can help you by facilitating care more promptly, often allowing for earlier appointments, access to diagnostic testing, procedures, and other specialty referrals.

## 2024-05-08 NOTE — Anesthesia Postprocedure Evaluation (Signed)
 Anesthesia Post Note  Patient: Alexis Howell   Procedure(s) Performed: EXCISION, MASS, UPPER EXTREMITY (Left)     Patient location during evaluation: PACU Anesthesia Type: Regional and MAC Level of consciousness: awake and alert Pain management: pain level controlled Vital Signs Assessment: post-procedure vital signs reviewed and stable Respiratory status: spontaneous breathing, nonlabored ventilation, respiratory function stable and patient connected to nasal cannula oxygen Cardiovascular status: stable and blood pressure returned to baseline Postop Assessment: no apparent nausea or vomiting Anesthetic complications: no   No notable events documented.  Last Vitals:  Vitals:   05/07/24 1430 05/07/24 1445  BP: (!) 142/97 104/67  Pulse: 76 78  Resp: 20 20  Temp: 36.4 C 36.4 C  SpO2: 99% 97%    Last Pain:  Vitals:   05/07/24 1445  PainSc: 2                  Thom JONELLE Peoples

## 2024-05-09 LAB — SURGICAL PATHOLOGY

## 2024-06-03 ENCOUNTER — Ambulatory Visit: Payer: Self-pay | Admitting: Surgery

## 2024-06-03 NOTE — H&P (View-Only) (Signed)
 "  PATIENT NAME: Alexis Howell MRN: [id number] DOB: [birth year] PHYSICIANS:  REFERRING PHYSICIAN:  Leigh Elspeth Mt*  CARE TEAM:   Patient Care Team: Burney Darice CROME, MD as PCP - General (Family Medicine) Armbruster, Elspeth Mt, MD as Referring Physician (Gastroenterology) Sheldon, Elspeth Bitter, MD as Consulting Provider (General Surgery) Tad Race, CNM as Midwife (Maternal and Fetal Medicine)  CONSULTING PROVIDER:  ELSPETH BITTER SHELDON, MD     SUBJECTIVE   Chief Complaint: New Consultation    Alexis Howell  is a 43 y.o. 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  History of pancreatitis and anorectal issues followed by [facility] gastroenterology.  History of gastropathy.  History of irregular menorrhagia seeing gynecology as well.  Had rectal pain.  While she in the hospital normal and colonoscopy June 2025.  Hemorrhoids and perianal tags noted.  1 hyperplastic polyp removed.  Some mild diverticulosis.  No evidence of any colitis or IBD.  No other source of bleeding aside from internal hemorrhoids.  No major intraluminal lesions.  Had persistent rectal pain and bleeding.  Posterior midline anal fissure diagnosed by Dr. Leigh with [facility].  Placed on diltiazem cream.  Tried to apply it but was uncomfortable.  Not fully resolved.  Followed up in November with persistent fissure.  Switched to RectiCare nitroglycerin cream and surgical consultation requested.  Patient here with her husband.  She has been pretty miserable for a while.  Usually moves her bowels once a day but when she had a bad flare of pancreatitis she had watery high-volume diarrhea for 6 months.  Things under better control since September but still struggling.  Trying to place the  diltiazem but did not seem to help.  Nitroglycerin [facility] to give headaches but she is trying to apply it.  Hard to keep the area clean.  Denies any history of UTIs.  No urinary incontinence.  No rectal bleeding.  She is struggling with some irregular menses and is contemplating possible IUD versus second opinion gynecology.  She normally can walk 2030 minutes out difficulty.  She had a C-section and cholecystectomy no other abdominal surgeries.  No sleep apnea.  No diabetes.  Does not smoke.    Medical History:  History reviewed. No pertinent past medical history.  Patient Active Problem List  Diagnosis   Chronic anal fissure   Prolapsed internal hemorrhoids, grade 4   External hemorrhoids with complication   Idiopathic acute pancreatitis (HHS-HCC)   Menstrual irregularity   Hyperplastic polyp of sigmoid colon    Past Surgical History:  Procedure Laterality Date   CESAREAN SECTION     CHOLECYSTECTOMY       No Known Allergies  Current Outpatient Medications on File Prior to Visit  Medication Sig Dispense Refill   ergocalciferol , vitamin D2, 1,250 mcg (50,000 unit) capsule Take 50,000 Units by mouth every 7 (seven) days     hydrOXYzine  (ATARAX ) 25 MG tablet Take 25-50 mg by mouth     ondansetron  (ZOFRAN -ODT) 4 MG disintegrating tablet Take 4 mg by mouth     pantoprazole  (PROTONIX ) 40 MG DR tablet Take 40 mg by mouth once daily     No current facility-administered medications on file prior to visit.    Family History  Problem Relation Age of Onset   Skin cancer Mother  Obesity Mother    Hyperlipidemia (Elevated cholesterol) Mother    Breast cancer Mother    Obesity Father    Skin cancer Father      Social History   Tobacco Use  Smoking Status Never  Smokeless Tobacco Never     Social History   Socioeconomic History   Marital status: Married  Tobacco Use   Smoking status: Never   Smokeless tobacco: Never  Substance and Sexual Activity    Alcohol use: Yes    Alcohol/week: 0.0 - 1.0 standard drinks of alcohol   Drug use: Never   Social Drivers of Health   Food Insecurity: No Food Insecurity (10/05/2023)   Received from [facility]   Hunger Vital Sign    Within the past 12 months, you worried that your food would run out before you got the money to buy more.: Never true    Within the past 12 months, the food you bought just didn't last and you didn't have money to get more.: Never true  Transportation Needs: No Transportation Needs (10/05/2023)   Received from [facility] - Transportation    Lack of Transportation (Medical): No    Lack of Transportation (Non-Medical): No  Social Connections: Socially Integrated (10/05/2023)   Received from [facility]   Social Connection and Isolation Panel    In a typical week, how many times do you talk on the phone with family, friends, or neighbors?: Three times a week    How often do you get together with friends or relatives?: Three times a week    How often do you attend church or religious services?: More than 4 times per year    Do you belong to any clubs or organizations such as church groups, unions, fraternal or athletic groups, or school groups?: Yes    How often do you attend meetings of the clubs or organizations you belong to?: More than 4 times per year    Are you married, widowed, divorced, separated, never married, or living with a partner?: Married    ############################################################  Review of Systems: A complete review of systems (ROS) was obtained from the patient.   We have reviewed this information and discussed as appropriate with the patient.   See HPI as well for other pertinent ROS.  Constitutional:  No fevers, chills, sweats.  Weight stable Eyes:  No vision changes, No discharge HENT:  No sore throats, nasal drainage Lymph: No neck swelling, No bruising easily Pulmonary:  No cough, productive sputum CV: No  orthopnea, PND . No exertional chest/neck/shoulder/arm pain.  Patient can walk 20 minutes without difficulty.    GI: Reactive gastropathy on stomach.  Idiopathic pancreatitis  Otherwise no personal nor family history of GI/colon cancer, inflammatory bowel disease, irritable bowel syndrome, allergy such as Celiac Sprue, dietary/dairy problems, colitis.  No recent sick contacts/gastroenteritis.  No travel outside the country.  No changes in diet.  Renal: No UTIs, No hematuria Genital:  No drainage, bleeding, masses Musculoskeletal: No severe joint pain.  Good ROM major joints Skin:  No sores or lesions Heme/Lymph:  No easy bleeding.  No swollen lymph nodes Neuro:  No active seizures.  No facial droop Psych:  No hallucinations.  No agitation  OBJECTIVE   Vitals:   06/03/24 0918  BP: (!) 140/85  Pulse: (!) 111  Temp: 36.8 C (98.3 F)  SpO2: 98%  Weight: (!) 146.5 kg (323 lb)  Height: 162.6 cm (5' 4)  PainSc:   6  PainLoc: Rectum    Body mass index is 55.44 kg/m.  PHYSICAL EXAM:   Constitutional: Not cachectic.  Hygeine adequate.  Vitals signs as above.   Eyes: No glasses.  Vision adequate,Pupils reactive, normal extraocular movements. Sclera nonicteric Neuro: CN II-XII intact.  No major focal sensory defects.  No major motor deficits. Lymph: No head/neck/groin lymphadenopathy Psych:  No severe agitation.  No severe anxiety.  Judgment & insight Adequate, Oriented x4, HENT: Normocephalic, Mucus membranes moist.  No thrush.  Hearing: adequate Neck: Supple, No tracheal deviation.  No obvious thyromegaly Chest: No pain to chest wall compression.  Good respiratory excursion.  No audible wheezing CV:  Pulses intact.  regular.  No major extremity edema Ext: No obvious deformity or contracture.  Edema: Not present.  No cyanosis Skin: No major subcutaneous nodules.  Warm and dry Musculoskeletal: Severe joint rigidity not present.  No obvious clubbing.  No digital petechiae.  Mobility:  no assist device moving easily without restrictions  Abdomen:  Obese Soft.   Nondistended.  Nontender.    Hernia: Not present. Diastasis recti: Not present.  No hepatomegaly.  No splenomegaly.  Genital/Pelvic:  Inguinal hernia: Not present.  Inguinal lymph nodes: without lymphadenopathy nor hidradenitis.    Rectal:   PE Chaperone note: Russell Christine, CMA, was included in the room as chaperone for sensitive portions of the exam   Perianal skin Clean with good hygiene  Pruritis ani: Mild Pilonidal disease: Not present  External hemorrhoids: Numerous external hemorrhoid pedunculated tags and some grade 4 hemorrhoids Condyloma / warts: Not present Anal fissure: Probable posterior midline anal fissure very sensitive Perirectal abscess/fistula: Not present Sphincter tone: Increased with anal spasming  Digital and anoscopic rectal exam: Deferred given severe anal pain  Other significant findings:  N/A  PE Chaperone note: Russell Christine, CMA, was included in the room as chaperone for sensitive portions of the exam    ###################################  ###################################################################  Labs, Imaging and Diagnostic Testing:  Located in 'Care Everywhere' section of Epic EMR chart   PRIOR [facility] CLINIC NOTES:  Not applicable   SURGERY NOTES:  Not applicable   PATHOLOGY:  Located in 'Care Everywhere' section of Epic EMR chart    Assessment and Plan:  DIAGNOSES:  Diagnoses and all orders for this visit:  Chronic anal fissure  Prolapsed internal hemorrhoids, grade 4  External hemorrhoids with complication  Idiopathic acute pancreatitis, unspecified complication status (HHS-HCC)  Menstrual irregularity  Hyperplastic polyp of sigmoid colon     ASSESSMENT/PLAN  Pleasant woman with irregular bowels most likely due to idiopathic pancreatitis triggering worsening hemorrhoids and chronic anal fissure refractory to diltiazem and  nitroglycerin and improved bowel function.  No evidence of any colitis or inflammatory bowel disease etiology  Recommend she stay on a daily fiber supplement to avoid future flares.  Agree with good follow-up on her idiopathic pancreatitis.  Hopefully either [facility] or second opinion can help avoid future flares and future triggers.  Definite challenge.    Consider trying the diltiazem again or nitroglycerin.  Apply it 4 times daily to help with the anal pain and spasming.  No suppository application just outside like a lip balm on the anus  They have been trying most everything with GI, and I think she has exhausted nonoperative management and would benefit from outpatient surgery.  Anorectal examination esthesia.  Hemorrhoid ligation & pexy and hemorrhoidectomy.  Probable partial internal sphincterotomy to break the cycle spasming and pain.  I do not think she can wait  too long.  Will try and figure it in soon. The anatomy & physiology of the anorectal region was discussed.  The pathophysiology of anal fissure and differential diagnosis was discussed.  Natural history progression  was discussed.   I stressed the importance of a bowel regimen to have daily soft bowel movements to minimize progression of disease.     The patient's condition is not adequately controlled.  Non-operative treatment has not healed the fissure.  Therefore, I recommended examination under anesthesia for better examination to confirm the diagnosis and treat by lateral internal sphincterotomy to relax the spasm better & allow the fissure to heal.  Technique, benefits, alternatives were discussed.    .  I noted a good likelihood this will help address the problem.  Risks such as bleeding, pain, incontinence, injury to other organs, need for repair of tissues / organs, recurrence, heart attack, death, and other risks were discussed.    Educational handouts further explaining the pathology, treatment options, and bowel regimen  were given as well.  The patient expressed understanding & wishes to proceed with surgery.  I am skeptical that her irregular menstrual periods are due to her hemorrhoidal issues or pancreatitis.  Defer back to gynecology if she wishes to give IUD trial versus a second opinion.  I think she may want to focus on the anorectal issues and regroup.  FOLLOWUP:  Return for WE RECOMMEND SURGERY - See instructions.  I have reviewed this patient's available data, including medical history, doctor notes, radiology & other studies, events of note, test results, physical exam, etc as part of my evaluation.   A significant portion of that time was spent in counseling in discussion of management, need for further testing, need for other medical or surgical input, etc.   Care was provided by me.  Based on my assessment, this care required MODERATE- Level 4 level of medical decision making.  06/03/2024    The plan was discussed in detail with the patient today, who expressed understanding & appreciation.  The patient has my contact information, and understands to call me with any additional questions or concerns.  I & my group would be happy to see the patient back sooner if the need arises.  Of note, portions of this report may have been transcribed using voice recognition software. Effort was made to ensure accuracy; however, inadvertent computerized transcription errors with sound-a-like substitutions may be present.   Any transcriptional errors that result from this process are unintentional.  ########################################################   Elspeth KYM Schultze, MD, FACS, MASCRS Esophageal, Gastrointestinal & Colorectal Surgery Robotic and Minimally Invasive Surgery  [facility]  [address] [place], [place] [address] [phone] Fax [phone] Main              06/03/2024    "

## 2024-06-06 NOTE — Patient Instructions (Signed)
 SURGICAL WAITING ROOM VISITATION  Patients having surgery or a procedure may have no more than 2 support people in the waiting area - these visitors may rotate.    Children ages 46 and under will not be able to visit patients in [facility] under most circumstances.   Visitors with respiratory illnesses are discouraged from visiting and should remain at home.  If the patient needs to stay at the hospital during part of their recovery, the visitor guidelines for inpatient rooms apply. Pre-op nurse will coordinate an appropriate time for 1 support person to accompany patient in pre-op.  This support person may not rotate.    Please refer to the [facility] website for the visitor guidelines for Inpatients (after your surgery is over and you are in a regular room).    Your procedure is scheduled on: 06/28/23   Report to [facility] Main Entrance    Report to admitting at 9:00 AM   Call this number if you have problems the morning of surgery [phone]   Follow a liquid diet the day before surgery.   May have clear liquids morning of surgery up unit 8:15 AM.  Water Non-Citrus Juices (without pulp, NO RED-Apple, White grape, White cranberry) Black Coffee (NO MILK/CREAM OR CREAMERS, sugar ok)  Clear Tea (NO MILK/CREAM OR CREAMERS, sugar ok) regular and decaf                             Plain Jell-O (NO RED)                                           Fruit ices (not with fruit pulp, NO RED)                                     Popsicles (NO RED)                                                               Sports drinks like Gatorade (NO RED)                      If you have questions, please contact your surgeons office.   FOLLOW BOWEL PREP AND ANY ADDITIONAL PRE OP INSTRUCTIONS YOU RECEIVED FROM YOUR SURGEON'S OFFICE!!!     Oral Hygiene is also important to reduce your risk of infection.                                    Remember - BRUSH YOUR TEETH THE MORNING OF  SURGERY WITH YOUR REGULAR TOOTHPASTE  DENTURES WILL BE REMOVED PRIOR TO SURGERY PLEASE DO NOT APPLY Poly grip OR ADHESIVES!!!   Do NOT smoke after Midnight   Stop all vitamins and herbal supplements 7 days before surgery.   Take these medicines the morning of surgery with A SIP OF WATER: Zofran   DO NOT TAKE ANY ORAL DIABETIC MEDICATIONS DAY OF YOUR SURGERY  Bring CPAP mask and  tubing day of surgery.                              You may not have any metal on your body including hair pins, jewelry, and body piercing             Do not wear make-up, lotions, powders, perfumes, or deodorant  Do not wear nail polish including gel and S&S, artificial/acrylic nails, or any other type of covering on natural nails including finger and toenails. If you have artificial nails, gel coating, etc. that needs to be removed by a nail salon please have this removed prior to surgery or surgery may need to be canceled/ delayed if the surgeon/ anesthesia feels like they are unable to be safely monitored.   Do not shave  48 hours prior to surgery   Do not bring valuables to the hospital. [facility] IS NOT             RESPONSIBLE   FOR VALUABLES.   Contacts, glasses, dentures or bridgework may not be worn into surgery.  DO NOT BRING YOUR HOME MEDICATIONS TO THE HOSPITAL. PHARMACY WILL DISPENSE MEDICATIONS LISTED ON YOUR MEDICATION LIST TO YOU DURING YOUR ADMISSION IN THE HOSPITAL!    Patients discharged on the day of surgery will not be allowed to drive home.  Someone NEEDS to stay with you for the first 24 hours after anesthesia.   Special Instructions: Bring a copy of your healthcare power of attorney and living will documents the day of surgery if you haven't scanned them before.              Please read over the following fact sheets you were given: IF YOU HAVE QUESTIONS ABOUT YOUR PRE-OP INSTRUCTIONS PLEASE CALL [phone].   If you received a COVID test during your pre-op visit  it  is requested that you wear a mask when out in public, stay away from anyone that may not be feeling well and notify your surgeon if you develop symptoms. If you test positive for Covid or have been in contact with anyone that has tested positive in the last 10 days please notify you surgeon.    [facility] - Preparing for Surgery Before surgery, you can play an important role.  Because skin is not sterile, your skin needs to be as free of germs as possible.  You can reduce the number of germs on your skin by washing with CHG (chlorahexidine gluconate) soap before surgery.  CHG is an antiseptic cleaner which kills germs and bonds with the skin to continue killing germs even after washing. Please DO NOT use if you have an allergy to CHG or antibacterial soaps.  If your skin becomes reddened/irritated stop using the CHG and inform your nurse when you arrive at Short Stay. Do not shave (including legs and underarms) for at least 48 hours prior to the first CHG shower.  You may shave your face/neck.  Please follow these instructions carefully:  1.  Shower with CHG Soap the night before surgery ONLY (DO NOT USE THE SOAP THE MORNING OF SURGERY).  2.  If you choose to wash your hair, wash your hair first as usual with your normal  shampoo.  3.  After you shampoo, rinse your hair and body thoroughly to remove the shampoo.  4.  Use CHG as you would any other liquid soap.  You can apply chg directly to the skin and wash.  Gently with a scrungie or clean washcloth.  5.  Apply the CHG Soap to your body ONLY FROM THE NECK DOWN.   Do   not use on face/ open                           Wound or open sores. Avoid contact with eyes, ears mouth and   genitals (private parts).                       Wash face,  Genitals (private parts) with your normal soap.             6.  Wash thoroughly, paying special attention to the area where your    surgery  will be performed.  7.  Thoroughly rinse your  body with warm water from the neck down.  8.  DO NOT shower/wash with your normal soap after using and rinsing off the CHG Soap.                9.  Pat yourself dry with a clean towel.            10.  Wear clean pajamas.            11.  Place clean sheets on your bed the night of your first shower and do not  sleep with pets. Day of Surgery : Do not apply any CHG, lotions/deodorants the morning of surgery.  Please wear clean clothes to the hospital/surgery center.  FAILURE TO FOLLOW THESE INSTRUCTIONS MAY RESULT IN THE CANCELLATION OF YOUR SURGERY  PATIENT SIGNATURE_________________________________  NURSE SIGNATURE__________________________________  ________________________________________________________________________

## 2024-06-06 NOTE — Progress Notes (Signed)
 Date of COVID positive in last 90 days:  PCP - Darice Henle, MD Cardiologist - n/a  Chest x-ray - N/A EKG - 06/07/24 Epic/chart Stress Test - N/A ECHO - N/A Cardiac Cath - N/A Pacemaker/ICD device last checked:N/A Spinal Cord Stimulator:N/A  Bowel Prep - full liquid diet and miralax  day before. Patient aware and has instructions.  Sleep Study - N/A CPAP -   Fasting Blood Sugar - N/A Checks Blood Sugar _____ times a day  Last dose of GLP1 agonist-  N/A GLP1 instructions:  Do not take after     Last dose of SGLT-2 inhibitors-  N/A SGLT-2 instructions:  Do not take after     Blood Thinner Instructions: N/A Last dose:   Time: Aspirin Instructions:N/A Last Dose:  Activity level: Can go up a flight of stairs and perform activities of daily living without stopping and without symptoms of chest pain or shortness of breath.  Anesthesia review: BP 157/98 and 154/99. Has never been diagnosed with HTN. Patient states she has been having elevated BP since her anal fissure. Has been monitoring at home and comes down after she takes medication for pain. Incomplete RBBB on EKG at PAT. Denies any symptoms of HTN.  Patient denies shortness of breath, fever, cough and chest pain at PAT appointment  Patient verbalized understanding of instructions that were given to them at the PAT appointment. Patient was also instructed that they will need to review over the PAT instructions again at home before surgery.

## 2024-06-07 ENCOUNTER — Other Ambulatory Visit: Payer: Self-pay

## 2024-06-07 ENCOUNTER — Encounter (HOSPITAL_COMMUNITY)
Admission: RE | Admit: 2024-06-07 | Discharge: 2024-06-07 | Disposition: A | Discharge status: Home / Self Care | Source: Ambulatory Visit | Attending: Surgery | Admitting: Surgery

## 2024-06-07 ENCOUNTER — Encounter (HOSPITAL_COMMUNITY): Payer: Self-pay

## 2024-06-07 VITALS — BP 157/98 | HR 81 | Temp 97.8°F | Resp 16 | Ht 64.0 in | Wt 323.0 lb

## 2024-06-07 DIAGNOSIS — D649 Anemia, unspecified: Secondary | ICD-10-CM | POA: Insufficient documentation

## 2024-06-07 DIAGNOSIS — Z01818 Encounter for other preprocedural examination: Secondary | ICD-10-CM | POA: Insufficient documentation

## 2024-06-07 DIAGNOSIS — I10 Essential (primary) hypertension: Secondary | ICD-10-CM | POA: Insufficient documentation

## 2024-06-07 LAB — CBC
HCT: 41.9 % (ref 36.0–46.0)
Hemoglobin: 13.1 g/dL (ref 12.0–15.0)
MCH: 26 pg (ref 26.0–34.0)
MCHC: 31.3 g/dL (ref 30.0–36.0)
MCV: 83.3 fL (ref 80.0–100.0)
Platelets: 368 K/uL (ref 150–400)
RBC: 5.03 MIL/uL (ref 3.87–5.11)
RDW: 13.9 % (ref 11.5–15.5)
WBC: 8.1 K/uL (ref 4.0–10.5)
nRBC: 0 % (ref 0.0–0.2)

## 2024-06-07 LAB — BASIC METABOLIC PANEL WITH GFR
Anion gap: 12 (ref 5–15)
BUN: 14 mg/dL (ref 6–20)
CO2: 24 mmol/L (ref 22–32)
Calcium: 9.6 mg/dL (ref 8.9–10.3)
Chloride: 99 mmol/L (ref 98–111)
Creatinine, Ser: 0.78 mg/dL (ref 0.44–1.00)
GFR, Estimated: >60 mL/min
Glucose, Bld: 99 mg/dL (ref 70–99)
Potassium: 4.7 mmol/L (ref 3.5–5.1)
Sodium: 135 mmol/L (ref 135–145)

## 2024-06-24 ENCOUNTER — Encounter: Payer: Self-pay | Admitting: Gastroenterology

## 2024-06-27 ENCOUNTER — Encounter (HOSPITAL_COMMUNITY): Payer: Self-pay | Admitting: Surgery

## 2024-06-27 ENCOUNTER — Ambulatory Visit (HOSPITAL_COMMUNITY): Admitting: Certified Registered"

## 2024-06-27 ENCOUNTER — Encounter (HOSPITAL_COMMUNITY)
Admission: RE | Disposition: A | Discharge status: Home / Self Care | Payer: Self-pay | Source: Ambulatory Visit | Attending: Surgery

## 2024-06-27 ENCOUNTER — Ambulatory Visit (HOSPITAL_COMMUNITY)
Admission: RE | Admit: 2024-06-27 | Discharge: 2024-06-27 | Disposition: A | Discharge status: Home / Self Care | Source: Ambulatory Visit | Attending: Surgery | Admitting: Surgery

## 2024-06-27 ENCOUNTER — Other Ambulatory Visit: Payer: Self-pay

## 2024-06-27 DIAGNOSIS — K219 Gastro-esophageal reflux disease without esophagitis: Secondary | ICD-10-CM | POA: Insufficient documentation

## 2024-06-27 DIAGNOSIS — K449 Diaphragmatic hernia without obstruction or gangrene: Secondary | ICD-10-CM | POA: Insufficient documentation

## 2024-06-27 DIAGNOSIS — E282 Polycystic ovarian syndrome: Secondary | ICD-10-CM | POA: Insufficient documentation

## 2024-06-27 DIAGNOSIS — K644 Residual hemorrhoidal skin tags: Secondary | ICD-10-CM | POA: Diagnosis present

## 2024-06-27 DIAGNOSIS — K602 Anal fissure, unspecified: Secondary | ICD-10-CM

## 2024-06-27 DIAGNOSIS — Z8719 Personal history of other diseases of the digestive system: Secondary | ICD-10-CM | POA: Insufficient documentation

## 2024-06-27 DIAGNOSIS — I10 Essential (primary) hypertension: Secondary | ICD-10-CM | POA: Insufficient documentation

## 2024-06-27 DIAGNOSIS — K601 Chronic anal fissure: Secondary | ICD-10-CM | POA: Diagnosis present

## 2024-06-27 DIAGNOSIS — Z87891 Personal history of nicotine dependence: Secondary | ICD-10-CM | POA: Insufficient documentation

## 2024-06-27 DIAGNOSIS — K642 Third degree hemorrhoids: Secondary | ICD-10-CM | POA: Insufficient documentation

## 2024-06-27 HISTORY — PX: HEMORRHOID SURGERY: SHX153

## 2024-06-27 HISTORY — PX: SPHINCTEROTOMY: SHX5279

## 2024-06-27 HISTORY — PX: RECTAL EXAM UNDER ANESTHESIA: SHX6399

## 2024-06-27 LAB — POCT PREGNANCY, URINE: Preg Test, Ur: NEGATIVE

## 2024-06-27 SURGERY — SPHINCTEROTOMY, ANAL
Anesthesia: General

## 2024-06-27 MED ORDER — FENTANYL CITRATE (PF) 50 MCG/ML IJ SOSY
25.0000 ug | PREFILLED_SYRINGE | INTRAMUSCULAR | Status: DC | PRN
  Administered 2024-06-27 (×3): 50 ug via INTRAVENOUS

## 2024-06-27 MED ORDER — DIAZEPAM 2 MG PO TABS: 5.0000 mg | ORAL_TABLET | Freq: Four times a day (QID) | ORAL | Status: DC | PRN

## 2024-06-27 MED ORDER — SODIUM CHLORIDE 0.9% FLUSH: 3.0000 mL | INTRAVENOUS | Status: DC | PRN

## 2024-06-27 MED ORDER — GABAPENTIN 300 MG PO CAPS
300.0000 mg | ORAL_CAPSULE | ORAL | Status: AC
  Administered 2024-06-27: 300 mg via ORAL
  Filled 2024-06-27: qty 1

## 2024-06-27 MED ORDER — OXYCODONE HCL 5 MG PO TABS: 5.0000 mg | ORAL_TABLET | ORAL | Status: DC | PRN

## 2024-06-27 MED ORDER — DEXAMETHASONE SOD PHOSPHATE PF 10 MG/ML IJ SOLN
INTRAMUSCULAR | Status: DC | PRN
  Administered 2024-06-27: 8 mg via INTRAVENOUS

## 2024-06-27 MED ORDER — AMISULPRIDE (ANTIEMETIC) 5 MG/2ML IV SOLN
INTRAVENOUS | Status: AC
  Filled 2024-06-27: qty 4

## 2024-06-27 MED ORDER — HYDROMORPHONE HCL 1 MG/ML IJ SOLN
INTRAMUSCULAR | Status: AC
  Filled 2024-06-27: qty 1

## 2024-06-27 MED ORDER — ONDANSETRON HCL 4 MG/2ML IJ SOLN: 4.0000 mg | Freq: Once | INTRAMUSCULAR | Status: DC | PRN

## 2024-06-27 MED ORDER — FENTANYL CITRATE (PF) 50 MCG/ML IJ SOSY
PREFILLED_SYRINGE | INTRAMUSCULAR | Status: AC
  Filled 2024-06-27: qty 1

## 2024-06-27 MED ORDER — PROPOFOL 10 MG/ML IV BOLUS
INTRAVENOUS | Status: AC
  Filled 2024-06-27: qty 20

## 2024-06-27 MED ORDER — PROPOFOL 10 MG/ML IV BOLUS
INTRAVENOUS | Status: DC | PRN
  Administered 2024-06-27: 200 mg via INTRAVENOUS

## 2024-06-27 MED ORDER — OXYCODONE-ACETAMINOPHEN 5-325 MG PO TABS: 1.0000 | ORAL_TABLET | Freq: Four times a day (QID) | ORAL | 0 refills | Status: DC | PRN

## 2024-06-27 MED ORDER — SUCCINYLCHOLINE CHLORIDE 200 MG/10ML IV SOSY
PREFILLED_SYRINGE | INTRAVENOUS | Status: DC | PRN
  Administered 2024-06-27: 160 mg via INTRAVENOUS

## 2024-06-27 MED ORDER — HYDROMORPHONE HCL 1 MG/ML IJ SOLN
0.5000 mg | INTRAMUSCULAR | Status: DC | PRN
  Administered 2024-06-27: 0.5 mg via INTRAVENOUS

## 2024-06-27 MED ORDER — CHLORHEXIDINE GLUCONATE CLOTH 2 % EX PADS: 6.0000 | MEDICATED_PAD | Freq: Once | CUTANEOUS | Status: DC

## 2024-06-27 MED ORDER — OXYCODONE HCL 5 MG PO TABS
5.0000 mg | ORAL_TABLET | Freq: Once | ORAL | Status: AC | PRN
  Administered 2024-06-27: 5 mg via ORAL

## 2024-06-27 MED ORDER — DIBUCAINE (PERIANAL) 1 % EX OINT
TOPICAL_OINTMENT | CUTANEOUS | Status: AC
  Filled 2024-06-27: qty 28

## 2024-06-27 MED ORDER — BUPIVACAINE-EPINEPHRINE (PF) 0.25% -1:200000 IJ SOLN
INTRAMUSCULAR | Status: AC
  Filled 2024-06-27: qty 30

## 2024-06-27 MED ORDER — AMISULPRIDE (ANTIEMETIC) 5 MG/2ML IV SOLN
10.0000 mg | Freq: Once | INTRAVENOUS | Status: AC | PRN
  Administered 2024-06-27: 10 mg via INTRAVENOUS

## 2024-06-27 MED ORDER — LACTATED RINGERS IV SOLN: INTRAVENOUS | Status: DC

## 2024-06-27 MED ORDER — KETOROLAC TROMETHAMINE 30 MG/ML IJ SOLN: 30.0000 mg | Freq: Four times a day (QID) | INTRAMUSCULAR | Status: DC | PRN

## 2024-06-27 MED ORDER — MIDAZOLAM HCL (PF) 2 MG/2ML IJ SOLN
INTRAMUSCULAR | Status: DC | PRN
  Administered 2024-06-27: 2 mg via INTRAVENOUS

## 2024-06-27 MED ORDER — CHLORHEXIDINE GLUCONATE 0.12 % MT SOLN
15.0000 mL | Freq: Once | OROMUCOSAL | Status: AC
  Administered 2024-06-27: 15 mL via OROMUCOSAL

## 2024-06-27 MED ORDER — LIDOCAINE HCL (PF) 2 % IJ SOLN
INTRAMUSCULAR | Status: AC
  Filled 2024-06-27: qty 5

## 2024-06-27 MED ORDER — BUPIVACAINE-EPINEPHRINE (PF) 0.25% -1:200000 IJ SOLN
INTRAMUSCULAR | Status: DC | PRN
  Administered 2024-06-27: 50 mL

## 2024-06-27 MED ORDER — PHENYLEPHRINE 80 MCG/ML (10ML) SYRINGE FOR IV PUSH (FOR BLOOD PRESSURE SUPPORT)
PREFILLED_SYRINGE | INTRAVENOUS | Status: DC | PRN
  Administered 2024-06-27: 40 ug via INTRAVENOUS

## 2024-06-27 MED ORDER — ACETAMINOPHEN 500 MG PO TABS
1000.0000 mg | ORAL_TABLET | ORAL | Status: AC
  Administered 2024-06-27: 1000 mg via ORAL
  Filled 2024-06-27: qty 2

## 2024-06-27 MED ORDER — DIAZEPAM 5 MG PO TABS: 5.0000 mg | ORAL_TABLET | Freq: Three times a day (TID) | ORAL | 1 refills | Status: AC | PRN

## 2024-06-27 MED ORDER — SUGAMMADEX SODIUM 200 MG/2ML IV SOLN
INTRAVENOUS | Status: DC | PRN
  Administered 2024-06-27: 300 mg via INTRAVENOUS

## 2024-06-27 MED ORDER — FENTANYL CITRATE (PF) 100 MCG/2ML IJ SOLN
INTRAMUSCULAR | Status: AC
  Filled 2024-06-27: qty 2

## 2024-06-27 MED ORDER — MIDAZOLAM HCL 2 MG/2ML IJ SOLN
INTRAMUSCULAR | Status: AC
  Filled 2024-06-27: qty 2

## 2024-06-27 MED ORDER — OXYCODONE HCL 5 MG/5ML PO SOLN: 5.0000 mg | Freq: Once | ORAL | Status: AC | PRN

## 2024-06-27 MED ORDER — METHOCARBAMOL 500 MG PO TABS
ORAL_TABLET | ORAL | Status: AC
  Filled 2024-06-27: qty 2

## 2024-06-27 MED ORDER — BUPIVACAINE LIPOSOME 1.3 % IJ SUSP: 20.0000 mL | Freq: Once | INTRAMUSCULAR | Status: DC

## 2024-06-27 MED ORDER — ROCURONIUM BROMIDE 10 MG/ML (PF) SYRINGE
PREFILLED_SYRINGE | INTRAVENOUS | Status: DC | PRN
  Administered 2024-06-27: 60 mg via INTRAVENOUS

## 2024-06-27 MED ORDER — METHOCARBAMOL 500 MG PO TABS
1000.0000 mg | ORAL_TABLET | Freq: Four times a day (QID) | ORAL | Status: DC | PRN
  Administered 2024-06-27: 1000 mg via ORAL

## 2024-06-27 MED ORDER — SODIUM CHLORIDE 0.9% FLUSH: 3.0000 mL | Freq: Two times a day (BID) | INTRAVENOUS | Status: DC

## 2024-06-27 MED ORDER — HYDROMORPHONE HCL 1 MG/ML IJ SOLN
0.2500 mg | INTRAMUSCULAR | Status: DC | PRN
  Administered 2024-06-27 (×4): 0.5 mg via INTRAVENOUS

## 2024-06-27 MED ORDER — OXYCODONE HCL 5 MG PO TABS
ORAL_TABLET | ORAL | Status: AC
  Filled 2024-06-27: qty 1

## 2024-06-27 MED ORDER — METHOCARBAMOL 1000 MG/10ML IJ SOLN: 1000.0000 mg | Freq: Four times a day (QID) | INTRAMUSCULAR | Status: DC | PRN

## 2024-06-27 MED ORDER — ONDANSETRON HCL 4 MG/2ML IJ SOLN
INTRAMUSCULAR | Status: DC | PRN
  Administered 2024-06-27: 4 mg via INTRAVENOUS

## 2024-06-27 MED ORDER — LIDOCAINE HCL (PF) 2 % IJ SOLN
INTRAMUSCULAR | Status: DC | PRN
  Administered 2024-06-27: 100 mg via INTRADERMAL

## 2024-06-27 MED ORDER — GABAPENTIN 100 MG PO CAPS: 200.0000 mg | ORAL_CAPSULE | Freq: Three times a day (TID) | ORAL | Status: DC

## 2024-06-27 MED ORDER — ROCURONIUM BROMIDE 10 MG/ML (PF) SYRINGE
PREFILLED_SYRINGE | INTRAVENOUS | Status: AC
  Filled 2024-06-27: qty 10

## 2024-06-27 MED ORDER — DIAZEPAM 2 MG PO TABS
ORAL_TABLET | ORAL | Status: AC
  Filled 2024-06-27: qty 3

## 2024-06-27 MED ORDER — SUGAMMADEX SODIUM 200 MG/2ML IV SOLN
INTRAVENOUS | Status: AC
  Filled 2024-06-27: qty 4

## 2024-06-27 MED ORDER — ORAL CARE MOUTH RINSE: 15.0000 mL | Freq: Once | OROMUCOSAL | Status: AC

## 2024-06-27 MED ORDER — DIAZEPAM 2 MG PO TABS
5.0000 mg | ORAL_TABLET | Freq: Four times a day (QID) | ORAL | Status: DC | PRN
  Administered 2024-06-27: 5 mg via ORAL

## 2024-06-27 MED ORDER — ACETAMINOPHEN 10 MG/ML IV SOLN: 1000.0000 mg | Freq: Once | INTRAVENOUS | Status: DC | PRN

## 2024-06-27 MED ORDER — ONDANSETRON HCL 4 MG/2ML IJ SOLN
INTRAMUSCULAR | Status: AC
  Filled 2024-06-27: qty 2

## 2024-06-27 MED ORDER — BUPIVACAINE LIPOSOME 1.3 % IJ SUSP
INTRAMUSCULAR | Status: AC
  Filled 2024-06-27: qty 20

## 2024-06-27 MED ORDER — FENTANYL CITRATE (PF) 100 MCG/2ML IJ SOLN
INTRAMUSCULAR | Status: DC | PRN
  Administered 2024-06-27 (×2): 25 ug via INTRAVENOUS
  Administered 2024-06-27: 50 ug via INTRAVENOUS
  Administered 2024-06-27: 100 ug via INTRAVENOUS

## 2024-06-27 MED ORDER — SODIUM CHLORIDE 0.9 % IV SOLN: 250.0000 mL | INTRAVENOUS | Status: DC | PRN

## 2024-06-27 MED ORDER — CELECOXIB 200 MG PO CAPS
200.0000 mg | ORAL_CAPSULE | ORAL | Status: AC
  Administered 2024-06-27: 200 mg via ORAL
  Filled 2024-06-27: qty 1

## 2024-06-27 SURGICAL SUPPLY — 36 items
BAG COUNTER SPONGE SURGICOUNT (BAG) IMPLANT
BENZOIN TINCTURE PRP APPL 2/3 (GAUZE/BANDAGES/DRESSINGS) ×1 IMPLANT
BLADE SURG 15 STRL LF DISP TIS (BLADE) ×1 IMPLANT
BLADE SURG SZ11 CARB STEEL (BLADE) ×1 IMPLANT
BRIEF MESH DISP LRG (UNDERPADS AND DIAPERS) ×1 IMPLANT
CNTNR URN SCR LID CUP LEK RST (MISCELLANEOUS) ×1 IMPLANT
COVER SURGICAL LIGHT HANDLE (MISCELLANEOUS) ×1 IMPLANT
DRAPE LAPAROTOMY T 102X78X121 (DRAPES) ×1 IMPLANT
ELECT NEEDLE BLADE 2-5/6 (NEEDLE) IMPLANT
ELECT REM PT RETURN 15FT ADLT (MISCELLANEOUS) ×1 IMPLANT
GAUZE 4X4 16PLY ~~LOC~~+RFID DBL (SPONGE) ×1 IMPLANT
GAUZE PAD ABD 8X10 STRL (GAUZE/BANDAGES/DRESSINGS) ×1 IMPLANT
GAUZE SPONGE 4X4 12PLY STRL (GAUZE/BANDAGES/DRESSINGS) ×1 IMPLANT
GLOVE ECLIPSE 8.0 STRL XLNG CF (GLOVE) ×1 IMPLANT
GLOVE INDICATOR 8.0 STRL GRN (GLOVE) ×1 IMPLANT
GOWN STRL REUS W/ TWL XL LVL3 (GOWN DISPOSABLE) ×3 IMPLANT
KIT BASIN OR (CUSTOM PROCEDURE TRAY) ×1 IMPLANT
KIT TURNOVER KIT A (KITS) ×1 IMPLANT
LOOP VESSEL MAXI BLUE (MISCELLANEOUS) IMPLANT
NEEDLE HYPO 22X1.5 SAFETY MO (MISCELLANEOUS) ×1 IMPLANT
PACK BASIC VI WITH GOWN DISP (CUSTOM PROCEDURE TRAY) ×1 IMPLANT
PENCIL BUTTON HOLSTER BLD 10FT (ELECTRODE) ×1 IMPLANT
PENCIL SMOKE EVACUATOR (MISCELLANEOUS) IMPLANT
SCRUB CHG 4% DYNA-HEX 4OZ (MISCELLANEOUS) ×1 IMPLANT
SPIKE FLUID TRANSFER (MISCELLANEOUS) ×1 IMPLANT
SUCTION TUBE FRAZIER 12FR DISP (SUCTIONS) IMPLANT
SURGILUBE 2OZ TUBE FLIPTOP (MISCELLANEOUS) ×1 IMPLANT
SUT CHROMIC 2 0 SH (SUTURE) ×1 IMPLANT
SUT VIC AB 2-0 UR6 27 (SUTURE) ×6 IMPLANT
SWAB COLLECTION DEVICE MRSA (MISCELLANEOUS) IMPLANT
SWAB CULTURE ESWAB REG 1ML (MISCELLANEOUS) IMPLANT
SYR 20ML LL LF (SYRINGE) ×1 IMPLANT
SYR 3ML LL SCALE MARK (SYRINGE) IMPLANT
SYR BULB IRRIG 60ML STRL (SYRINGE) IMPLANT
TOWEL OR DSP ST BLU DLX 10/PK (DISPOSABLE) ×1 IMPLANT
YANKAUER SUCT BULB TIP 10FT TU (MISCELLANEOUS) ×1 IMPLANT

## 2024-06-27 NOTE — Anesthesia Preprocedure Evaluation (Addendum)
"                                    Anesthesia Evaluation  Patient identified by MRN, date of birth, ID band Patient awake    Reviewed: Allergy & Precautions, NPO status , Patient's Chart, lab work & pertinent test results  History of Anesthesia Complications (+) PONV and history of anesthetic complications  Airway Mallampati: I  TM Distance: >3 FB Neck ROM: Full    Dental  (+) Teeth Intact, Dental Advisory Given   Pulmonary former smoker   breath sounds clear to auscultation       Cardiovascular hypertension, + dysrhythmias (RBBB; LAFB)  Rhythm:Regular Rate:Normal     Neuro/Psych    GI/Hepatic hiatal hernia,GERD  Medicated and Controlled,, Hx of Pancreatitis (persistent)      Endo/Other    PCOS     Renal/GU Renal disease     Musculoskeletal  (+) Arthritis ,    Abdominal   Peds  Hematology  (+) Blood dyscrasia, anemia   Anesthesia Other Findings   Reproductive/Obstetrics                              Anesthesia Physical Anesthesia Plan  ASA: 3  Anesthesia Plan: General   Post-op Pain Management:    Induction: Intravenous and Rapid sequence  PONV Risk Score and Plan: 3 and Ondansetron , Dexamethasone , Midazolam  and Treatment may vary due to age or medical condition  Airway Management Planned: Oral ETT and Video Laryngoscope Planned  Additional Equipment: None  Intra-op Plan:   Post-operative Plan: Extubation in OR  Informed Consent: I have reviewed the patients History and Physical, chart, labs and discussed the procedure including the risks, benefits and alternatives for the proposed anesthesia with the patient or authorized representative who has indicated his/her understanding and acceptance.     Dental advisory given  Plan Discussed with: CRNA  Anesthesia Plan Comments:          Anesthesia Quick Evaluation  "

## 2024-06-27 NOTE — Interval H&P Note (Signed)
 History and Physical Interval Note:  06/27/2024 10:08 AM  Alexis Howell   has presented today for surgery, with the diagnosis of ANAL FISSURE, PROLAPSED HEMMORHOIDS.  The various methods of treatment have been discussed with the patient and family. After consideration of risks, benefits and other options for treatment, the patient has consented to  Procedures with comments: SPHINCTEROTOMY, ANAL (N/A) - GEN w/ERAS PATHWAY LOCAL HEMORRHOIDECTOMY (N/A) EXAM UNDER ANESTHESIA, RECTUM (N/A) as a surgical intervention.  The patient's history has been reviewed, patient examined, no change in status, stable for surgery.  I have reviewed the patient's chart and labs.  Questions were answered to the patient's satisfaction.    I have re-reviewed the the patient's records, history, medications, and allergies.  I have re-examined the patient.  I again discussed intraoperative plans and goals of post-operative recovery.  The patient agrees to proceed.  Alexis Howell   [birth year] [id number]  Patient Care Team: Alexis Darice CROME, MD as PCP - General (Family Medicine) Alexis Howell, Alexis SQUIBB, MD as Consulting Physician (Gastroenterology) Alexis Elspeth, MD as Consulting Physician (General Surgery)  Patient Active Problem List   Diagnosis Date Noted   Thickened endometrium 01/24/2024   Frequent menstruation 01/24/2024   BMI 50.0-59.9, adult (HCC) 01/22/2024   Dysmenorrhea 01/22/2024   Menorrhagia with irregular cycle 01/22/2024   PCOS (polycystic ovarian syndrome) 01/22/2024   History of ovarian cyst 01/22/2024   Gastritis without bleeding 01/17/2024   History of pancreatitis 01/17/2024   Mucosal abnormality of duodenum 01/17/2024   Blood in stool 11/23/2023   Hemorrhoids 11/23/2023   Benign neoplasm of colon 11/23/2023   Acute pancreatitis 10/04/2023   Unspecified ovarian cyst, left side 07/07/2021    Past Medical History:  Diagnosis Date   Anemia    Anxiety    Arthritis    Delivery  of pregnancy by cesarean section: Indication: arrest of dilation 11/26/2016   History of hiatal hernia    Hyperlipemia    Obesity    Pancreatitis    two episodes - 2022 and 2025 - unclear etiology   PCOS (polycystic ovarian syndrome)    Pneumonia    in her 20's   PONV (postoperative nausea and vomiting)    Prolonged first stage (of labor) 11/26/2016    Past Surgical History:  Procedure Laterality Date   BIOPSY  09/09/2021   Procedure: BIOPSY;  Surgeon: Alexis Toribio SQUIBB, MD;  Location: [facility];  Service: Endoscopy;;   CESAREAN SECTION N/A 11/26/2016   Procedure: CESAREAN SECTION;  Surgeon: Alexis Sor, MD;  Location: [facility];  Service: Obstetrics;  Laterality: N/A;   CHOLECYSTECTOMY N/A 08/12/2014   Procedure: LAPAROSCOPIC CHOLECYSTECTOMY WITH INTRAOPERATIVE CHOLANGIOGRAM;  Surgeon: Alexis CHRISTELLA Blush, MD;  Location: [facility];  Service: General;  Laterality: N/A;   COLONOSCOPY WITH PROPOFOL  N/A 11/23/2023   Procedure: COLONOSCOPY WITH PROPOFOL ;  Surgeon: Alexis Alexis SQUIBB, MD;  Location: [facility];  Service: Gastroenterology;  Laterality: N/A;   ESOPHAGOGASTRODUODENOSCOPY N/A 01/17/2024   Procedure: EGD (ESOPHAGOGASTRODUODENOSCOPY);  Surgeon: Alexis Aloha Raddle., MD;  Location: [facility];  Service: Gastroenterology;  Laterality: N/A;   ESOPHAGOGASTRODUODENOSCOPY (EGD) WITH PROPOFOL  N/A 09/09/2021   Procedure: ESOPHAGOGASTRODUODENOSCOPY (EGD) WITH PROPOFOL ;  Surgeon: Alexis Toribio SQUIBB, MD;  Location: [facility];  Service: Endoscopy;  Laterality: N/A;   EUS N/A 01/17/2024   Procedure: ULTRASOUND, UPPER GI TRACT, ENDOSCOPIC;  Surgeon: Alexis Aloha Raddle., MD;  Location: [facility];  Service: Gastroenterology;  Laterality: N/A;   EXCISION, MASS, UPPER EXTREMITY 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   Procedure: EXCISION, MASS,  UPPER EXTREMITY;  Surgeon: Alexis Fallow, MD;  Location: [facility];  Service: Orthopedics;  Laterality: Left;  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 and repair as necessary   HAND SURGERY 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   giant cell tumor removed   TONSILLECTOMY AND ADENOIDECTOMY     WISDOM TOOTH EXTRACTION      Social History   Socioeconomic History   Marital status: Married    Spouse name: Alexis Howell   Number of children: 1   Years of education: Not on file   Highest education level: Master's degree (e.g., MA, MS, MEng, MEd, MSW, MBA)  Occupational History   Occupation: self employed-wood working  Tobacco Use   Smoking status: Former    Current packs/day: 0.00    Types: Cigarettes    Quit date: 07/30/2009    Years since quitting: 14.9    Passive exposure: Past   Smokeless tobacco: Never  Vaping Use   Vaping status: Never Used  Substance and Sexual Activity   Alcohol use: Yes    Comment: once a month - 2-3 glasses of wine   Drug use: No   Sexual activity: Yes    Birth control/protection: None  Other Topics Concern   Not on file  Social History Narrative   Master's degree in [place] and Rec from [place].   Lives with her husband, their son (born 11/26/2016) and dog and cat.   Family is spread out: parents in [place] , sister in [place], born in [place]).   Husband's family is local.   Social Drivers of Health   Tobacco Use: Medium Risk (06/27/2024)   Patient History    Smoking Tobacco Use: Former    Smokeless Tobacco Use: Never    Passive Exposure: Past  Physicist, Medical Strain: Not on file  Food Insecurity: No Food Insecurity (10/05/2023)   Hunger Vital Sign    Worried About Running Out of Food in the Last Year: Never true    Ran Out of Food in the Last Year: Never true  Transportation Needs: No Transportation Needs (10/05/2023)   PRAPARE - Administrator, Civil Service (Medical): No    Lack of Transportation (Non-Medical): No  Physical Activity: Not on file  Stress: Not on file  Social Connections: Socially Integrated (10/05/2023)   Social Connection and Isolation Panel    Frequency of Communication  with Friends and Family: Three times a week    Frequency of Social Gatherings with Friends and Family: Three times a week    Attends Religious Services: More than 4 times per year    Active Member of Clubs or Organizations: Yes    Attends Banker Meetings: More than 4 times per year    Marital Status: Married  Catering Manager Violence: Not At Risk (10/05/2023)   Humiliation, Afraid, Rape, and Kick questionnaire    Fear of Current or Ex-Partner: No    Emotionally Abused: No    Physically Abused: No    Sexually Abused: No  Depression (PHQ2-9): Low Risk (01/22/2024)   Depression (PHQ2-9)    PHQ-2 Score: 0  Alcohol Screen: Not on file  Housing: Low Risk (10/05/2023)   Housing Stability Vital Sign    Unable to Pay for Housing in the Last Year: No    Number of Times Moved in the Last Year: 0    Homeless in the Last Year: No  Utilities: Not At Risk (10/05/2023)   AHC Utilities    Threatened with loss of utilities: No  Health Literacy: Not on file    Family History  Problem Relation Age of Onset   Heart disease Mother    Diverticulitis Mother    Colon polyps Father    Seizures Sister    Diverticulitis Sister    Mental illness Paternal Grandmother    Colon cancer Neg Hx    Esophageal cancer Neg Hx     Medications Prior to Admission  Medication Sig Dispense Refill Last Dose/Taking   AMBULATORY NON FORMULARY MEDICATION Medication Name: Nitroglycerin ointment 0.125% Apply a pea sized amount onto a glycerin  suppository and insert rectally three times daily until symptoms resolve 30 g 1 Taking   Ascorbic Acid  (VITAMIN C  PO) Take 100 mg by mouth in the morning and at bedtime. (Lozenge)   06/20/2024   hydrOXYzine  (ATARAX ) 25 MG tablet Take 25-50 mg by mouth every 6 (six) hours as needed for anxiety (sleep).   06/26/2024 Bedtime   NONFORMULARY OR COMPOUNDED ITEM Place 1 Application rectally in the morning and at bedtime. (Diltiazem 2%/Lidoc 2%oint) APPLY PEA-SIZED AMOUNT INTO RECTUM  THREE TIMES DAILY FOR 4 WEEKS OR UNTIL HEALED.   06/26/2024 Noon   ondansetron  (ZOFRAN -ODT) 4 MG disintegrating tablet Take 1 tablet (4 mg total) by mouth every 8 (eight) hours as needed for nausea or vomiting. Take once or twice a day on a schedule 60 tablet 5 Past Week   pantoprazole  (PROTONIX ) 40 MG tablet Take 1 tablet (40 mg total) by mouth daily. (Patient taking differently: Take 40 mg by mouth every evening.) 30 tablet 5 06/26/2024 Bedtime    Current Facility-Administered Medications  Medication Dose Route Frequency Provider Last Rate Last Admin   bupivacaine  liposome (EXPAREL ) 1.3 % injection 266 mg  20 mL Infiltration Once Alexis Standing, MD       Chlorhexidine  Gluconate Cloth 2 % PADS 6 each  6 each Topical Once Alexis Standing, MD       And   Chlorhexidine  Gluconate Cloth 2 % PADS 6 each  6 each Topical Once Alexis Standing, MD       lactated ringers  infusion   Intravenous Continuous Peggye Delon Brunswick, MD 10 mL/hr at 06/27/24 1004 New Bag at 06/27/24 1004     Allergies[1]  BP (!) 152/98 (BP Location: Right Arm)   Pulse 80   Temp 97.8 F (36.6 C) (Oral)   Resp 15   Ht 5' 4 (1.626 m)   Wt (!) 146.5 kg   LMP 06/24/2024 Comment: POC preg test (-) neg on 06/27/2024  SpO2 98%   BMI 55.44 kg/m   Labs: Results for orders placed or performed during the hospital encounter of 06/27/24 (from the past 48 hours)  Pregnancy, urine POC     Status: None   Collection Time: 06/27/24  9:40 AM  Result Value Ref Range   Preg Test, Ur NEGATIVE NEGATIVE    Comment:        THE SENSITIVITY OF THIS METHODOLOGY IS >20 mIU/mL.     Imaging / Studies: No results found.   Briant KYM Alexis, M.D., F.A.C.S. Gastrointestinal and Minimally Invasive Surgery [facility] [address] [place], [place] [address] [phone] Main / Paging  06/27/2024 10:08 AM    Standing JAYSON Alexis      [1] No Known Allergies

## 2024-06-27 NOTE — Discharge Instructions (Signed)
 ##############################################  ANORECTAL SURGERY:  POST OPERATIVE INSTRUCTIONS  ######################################################################  EAT Start with a pureed / full liquid diet After 24 hours, gradually transition to a high fiber diet.    CONTROL PAIN Control pain so you can tolerate bowel movements,  walk, sleep, tolerate sneezing/coughing, and go up/down stairs.   HAVE A BOWEL MOVEMENT DAILY Keep your bowels regular to avoid problems.   Taking a fiber supplement 2-4 doses every day to keep bowels soft.   HAVE A BM BY THE SECOND POSTOPERATIVE DAY Use an antidairrheal to slow down diarrhea.   Call if not better after 2 tries  WALK Walk an hour a day.  Control your pain to do that.   CALL IF YOU HAVE PROBLEMS/CONCERNS Call if you are still struggling despite following these instructions. Call if you have concerns not answered by these instructions  ######################################################################    Take your usually prescribed home medications unless otherwise directed. Blood thinners:  You can restart any strong blood thinners after the second postoperative day  for example: COUMADIN (warfarin), XERELTO (rivaroxaban), ELIQUIS (apixaban), PLAVIX (clopidigrel), BRILINTA (ticagrelor), EFFIENT (prasugrel), PRADAXA (dabigatran), etc  Continue aspirin before & after surgery..     Some oozing/bleeding the first 1-2 weeks is common but should taper down & be small volume.    If you are passing many large clots or having uncontrolling bleeding, call your surgeon  DIET: Follow a light bland diet & liquids the first 24 hours after arrival home, such as soup, liquids, starches, etc.  Be sure to drink plenty of fluids.  Quickly advance to a usual solid diet within a few days.  Avoid fast food or heavy meals as your are more likely to get nauseated or have irregular bowels.  A low-fat, high-fiber diet for the rest of your life  is ideal.  Control pain to avoid problems with urinating or defecating:  Pain is best controlled by a usual combination of many methods TOGETHER: Warm baths/soaks or Ice packs Over the counter pain medication Prescription pain medications Topical creams  A.  Warm water baths or ice packs (30-60 minutes up to 8 times a day, especially after bowel meovements) will help.  Using ice for the first few days can help decrease swelling and bruising,  Use heat such as warm towels, sitz baths, warm baths, warm showers, etc to help relax tight/sore spots and speed recovery.   Some people prefer to use ice alone, heat alone, alternating between ice & heat.  Experiment to what works for you.    It is helpful to take an over-the-counter pain medication continuously for the first few weeks.  This helps people need to use less narcotics Choose one of the following that works best for you: Naproxen (Aleve, etc)  Two 220mg  tabs twice a day Ibuprofen (Advil, etc) Three 200mg  tabs four times a day (every meal & bedtime) Acetaminophen (Tylenol, etc) 500-650mg  four times a day (every meal & bedtime)  A  prescription for pain medication (such as oxycodone, hydrocodone, etc) should be given to you upon discharge.  Take your pain medication as prescribed.  If you are having problems/concerns with the prescription medicine (does not control pain, nausea, vomiting, rash, itching, etc), please call us [phone] to see if we need to switch you to a different pain medicine that will work better for you and/or control your side effect better. Most people will need 1-2 refills to get through the first couple of weeks when pain is the most  intense.  If you need a refill on your pain medication, please contact your pharmacy.  They will contact our office to request authorization. Prescriptions will not be filled after 5 pm or on week-ends.  If can take up to 48 hours for it to be filled & ready so avoid waiting until you  are down to thel ast pill.  A numbing topical cream (Dibucaine) may be given to you.  Many people find relief with topical creams.  Some people find it burns too much.  Experiment.  If it helps, use it.  If it burns, stop using it.  You also may receive a prescription for diazepam, a muscle relaxant to help you to be able to urinate at first easily.  It is safe to take a few doses with the other medications as long as you are not planning to drive or do anything intense.  Hopefully this can minimize the chance of needing a Foley catheter into your bladder     Have a bowel movement every day  Until you have a bowel movement after surgery, he will struggle with urinating and controlling her pain.  Take a fiber supplement (such as Metamucil, Citrucel, FiberCon, MiraLax, etc) 2-4 doses a day to help prevent constipation from occurring.     If you have not had a bowel movement the second postoperative day:  -switch to drinking liquids only -take MiraLAX double dose every 2 hours x 3 or until you have a BM -If that does not work x 3 doses, increase to 4 doses every 2 hours (Like a small bowel prep) until you have a bowel movement. -Avoid enemas or suppositories (can harm sutures/repair) -Once you start having bowel movements, adjust your fiber supplement or Miralax dosing back down until you are having 1-2 soft well formed BMs a day.  (Start at 2 doses a day & adjust up or down to avoid diarrhea or recurrent constipation)   Watch out for diarrhea.  If you have many loose bowel movements, simplify your diet to bland foods & liquids for a few days.  Stop any stool softeners and decrease your fiber supplement.  Switching to mild anti-diarrheal medications (Kayopectate, Pepto Bismol) can help.  Can try an imodium/loperamide dose.  If this worsens or does not improve, please call us.  Wound Care   a. You have some fluffed cotton on top of the anus to help catch drainage and bleeding.  THERE IS NO  PACKING INSIDE THE RECTUM -Let the cotton fall off with the first bowel movement or shower.  It is okay to reinforce or replace as needed.  Bleeding is common at first and gradually slows down after a few weeks   b. Place soft cotton balls on the anus/wounds and use an absorbent pad in your underwear as needed to catch any drainage and help keep the area.  Try to use cotton balls or pads (regular gauze or toilet paper will stick and pull, causing pain.  Cotton will come off more easily).  OK to try dry powders such as cornstarch or baby powders   c. Keep the area clean and dry.  Bathe / shower every day.  Keep the area clean by showering / bathing over the incision / wound.   It is okay to soak an open wound to help wash it.  Consider using a squeeze bottle filled with warm water to gently wash the anal area.  Wet wipes or showers / gentle washing after bowel movements  is often less traumatic than regular toilet paper.           D.  Use a Sitz Bath 4-8 times a day for relief  A sitz bath is a warm water bath taken in the sitting position that covers only the hips and buttocks. It may be used for either healing or hygiene purposes. Sitz baths are also used to relieve pain, itching, or muscle spasms.  Gently cleaned the area and the heat will help lower spasm and offer better pain control.    Fill the bathtub half full with warm water. Sit in the water and open the drain a little. Turn on the warm water to keep the tub half full. Keep the water running constantly. Soak in the water for 15 to 20 minutes. After the sitz bath, pat the affected area dry first.   d. You will often notice bleeding, especially with bowel movements.  This should slow down by the end of the first week of surgery.  It can take over a month to more fully stop.  Sitting on an ice pack can help.   e. Expect some drainage.  You often will have some blood or yellow drainage with open wounds.  Sometimes she will get a little leaking  of liquid stool until the incision/wounds have fully close down.  This should slow down by the end of the first week of surgery, but you will have occasional bleeding or drainage up to a few months after surgery until all incisions & wounds have closed.  Wear an absorbent pad or soft cotton gauze in your underwear until the drainage stops.  ACTIVITIES as tolerated:    You may resume regular (light) daily activities beginning the next day--such as daily self-care, walking, climbing stairs--gradually increasing activities as tolerated.  If you can walk 30 minutes without difficulty, it is safe to try more intense activity such as jogging, treadmill, bicycling, low-impact aerobics, swimming, etc. Save the most intensive and strenuous activity for last such as sit-ups, heavy lifting, contact sports, etc  Refrain from any heavy lifting or straining until you are off narcotics for pain control.   DO NOT PUSH THROUGH PAIN.  Let pain be your guide: If it hurts to do something, don't do it.  Pain is your body warning you to avoid that activity for another week until the pain goes down. You may drive when you are no longer taking prescription pain medication, you can comfortably sit for long periods of time, and you can safely maneuver your car and apply brakes. You may have sexual intercourse when it is comfortable.   6.  FOLLOW UP in our office Please call [facility] at [phone] to set up an appointment to see your surgeon in the office for a follow-up appointment approximately 3 weeks after your surgery. If tissue is sent for pathology, it usually takes a week for pathology results to come back.  We will make an effort to call you with results as soon as we get them.  If you have not heard back in a week and wish to know the results before your postop visit, please call our office and we will see if we can give feedback on the results Make sure that you call for this appointment the day you arrive home to  ensure a convenient appointment time.  7. IF YOU HAVE DISABILITY OR FAMILY LEAVE FORMS, BRING THEM TO THE OFFICE FOR PROCESSING.  DO NOT GIVE THEM TO YOUR  DOCTOR.        WHEN TO CALL us [phone]: Poor pain control Reactions / problems with new medications (rash/itching, nausea, etc)  Fever over 101.5 F (38.5 C) Inability to urinate Nausea and/or vomiting Worsening swelling or bruising Continued bleeding from incision. Increased pain, redness, or drainage from the incision  The clinic staff is available to answer your questions during regular business hours (8:30am-5pm).  Please don't hesitate to call and ask to speak to one of our nurses for clinical concerns.   A surgeon from [facility] is always on call at the hospitals   If you have a medical emergency, go to the nearest emergency room or call 911.    [facility] [address], [place], [place]  [address] ? MAIN: [phone] ? TOLL FREE: [phone] ? FAX [phone] [email or web address]  #####################################################

## 2024-06-27 NOTE — Op Note (Addendum)
 06/27/2024  12:23 PM  PATIENT:  Alexis Howell [place]   44 y.o. female  Patient Care Team: Burney Darice CROME, MD as PCP - General (Family Medicine) Armbruster, Elspeth SQUIBB, MD as Consulting Physician (Gastroenterology) Sheldon Elspeth, MD as Consulting Physician (General Surgery)  PRE-OPERATIVE DIAGNOSIS:  ANAL FISSURE, HEMORRHOID - EXTERNAL, HEMORRHOID - INTERNAL GRADE 3 PROLAPSING, and HEMORRHOID - INTERNAL GRADE 2 PROLAPSING  POST-OPERATIVE DIAGNOSIS:   ANAL FISSURE HEMORRHOID - EXTERNAL HEMORRHOID - INTERNAL GRADE 3 PROLAPSING HEMORRHOID - INTERNAL GRADE 2 PROLAPSING  PROCEDURE:  ANAL SPHINCTEROTOMY - internal sphincter - partial HEMORRHOIDECTOMY x 4 HEMORRHOIDAL LIGATION & HEMORRHOIDOPEXY ANORECTAL EXAMINATION UNDER ANESTHESIA  SURGEON:  Elspeth KYM Sheldon, MD  ASSISTANT:  CANDIE Ronnald Nick, PA-S, [facility]   ANESTHESIA:  General endotracheal intubation anesthesia (GETA) and Anorectal and field block for perioperative & postoperative pain control provided with liposomal bupivacaine  (Experel) 20mL mixed with 30mL of bupivicaine 0.25% with epinephrine   Estimated Blood Loss (EBL):   Total I/O In: -  Out: 10 [Blood:10].   (See anesthesia record)  Delay start of Pharmacological VTE agent (>24hrs) due to concerns of significant anemia, surgical blood loss, or risk of bleeding?:  no  DRAINS: (None)  SPECIMEN:  Hemorrhoid: Right anterior, Right posterior, Left lateral, and Posterior midline  DISPOSITION OF SPECIMEN:  Pathology  COUNTS:  Sponge, needle, & instrument counts CORRECT at the conclusion of the case.      PLAN OF CARE: Discharge to home after PACU  PATIENT DISPOSITION:  PACU - hemodynamically stable.  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  I recommended examination and surgical treatment:  The anatomy & physiology of the anorectal region was discussed.  The pathophysiology of anal fissure and  differential diagnosis was discussed.  Natural history progression  was discussed.   I stressed the importance of a bowel regimen to have daily soft bowel movements to minimize progression of disease.     The patient's condition is not adequately controlled.  Non-operative treatment has not healed the fissure.  Therefore, I recommended examination under anesthesia for better examination to confirm the diagnosis and treat by lateral internal sphincterotomy to relax the spasm better & allow the fissure to heal.  Technique, benefits, alternatives were discussed.   I noted a good likelihood this will help address the problem.  Risks such as bleeding, pain, incontinence, recurrence, heart attack, death, and other risks were discussed.    Educational handouts further explaining the pathology, treatment options, and bowel regimen were given as well.  The patient expressed understanding & wishes to proceed with surgery.  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  50% distal internal sphincterotomy performed Patient had left lateral and right anterior external hemorrhoids as well as a right posterior midline external hemorrhoid sentinel tag.  Patient with grade 2-3 internal hemorrhoids.  Ligation pexy and some limited excisions of the  IDESCRIPTION:   Informed consent was confirmed. Patient underwent general anesthesia without difficulty. Patient was placed into prone/jacknife positioning.  The perianal region was prepped and draped in sterile fashion. Surgical timeout confirmed or plan.  I did digital rectal examination and then transitioned over to anoscopy to get a sense of the anatomy.  I identified an anal fissure in the posterior midline anal canal.  The sphincter tone was increased.  No stricture.  No abscess located.  No fistula  I went ahead and proceeded with internal  sphinterotmy technique.  Placed a Librarian, academic and used a 2-0 Vicryl sutures to centimeters proximal anal verge in the left lateral hemorrhoidal canal.  Rather than interrupting figure of eights.  I excised through the anoderm of the left lateral anal canal longitudinally via a concave fashion to help remove the grade 2 hemorrhoid as well as an external hemorrhoidal tag..  I identified the internal and external sphincters.  Elevating the internal sphincter.  I proceeded with a partial internal sphincterotomy starting distally and moving proximally using cautery.  This involved the distal 50% thickness.  This provided improved relaxation of the anal sphincter.  I then ran the Vicryl suture to close down the sphincterotomy into a hemorrhoidal ligation and pexy.  Then proceeded to hemorrhoidal ligation and pexy of the remaining 5 columns in the standard fashion using 2-0 Vicryl running fashion.  Ended up having to excise grade 2-3 hemorrhoids right posterior and right anterior slightly.  The right posterior I was able to excise the right posterior midline sentinel tag from the anal fissure along with the anal wound to close that down.  Trimmed very small right anterior external hemorrhoid follow-up.  Did this over a [employer]st  I reexamined the anal canal.   There is was no narrowing.  Hemostasis was excellent.  I repeated anoscopy and examination.  Hemostasis was good.  Patient is being extubated go to recovery room.  I discussed operative findings, updated the patient's status, discussed probable steps to recovery, and gave postoperative recommendations to the patient's spouse, Dustin.  Recommendations were made.  Questions were answered.  He expressed understanding & appreciation.   Elspeth KYM Schultze, M.D., F.A.C.S. Gastrointestinal and Minimally Invasive Surgery [facility] [address] [place], [place] [address] [phone]  Main / Paging

## 2024-06-27 NOTE — Anesthesia Procedure Notes (Signed)
 Procedure Name: Intubation Date/Time: 06/27/2024 11:01 AM  Performed by: Jermia Rigsby, Corean BROCKS, CRNAPre-anesthesia Checklist: Patient identified, Emergency Drugs available, Suction available and Patient being monitored Patient Re-evaluated:Patient Re-evaluated prior to induction Oxygen Delivery Method: Circle system utilized Preoxygenation: Pre-oxygenation with 100% oxygen Induction Type: IV induction Ventilation: Mask ventilation without difficulty Laryngoscope Size: Mac, 3 and Glidescope Grade View: Grade I Tube type: Oral Tube size: 7.0 mm Number of attempts: 1 Airway Equipment and Method: Stylet and Oral airway Placement Confirmation: ETT inserted through vocal cords under direct vision, positive ETCO2 and breath sounds checked- equal and bilateral Secured at: 21 cm Tube secured with: Tape Dental Injury: Teeth and Oropharynx as per pre-operative assessment

## 2024-06-27 NOTE — Transfer of Care (Addendum)
 Immediate Anesthesia Transfer of Care Note  Patient: Alexis Howell   Procedure(s) Performed: SPHINCTEROTOMY, ANAL HEMORRHOIDECTOMY EXAM UNDER ANESTHESIA, RECTUM  Patient Location: PACU  Anesthesia Type:General  Level of Consciousness: drowsy and patient cooperative  Airway & Oxygen Therapy: Patient Spontanous Breathing and Patient connected to face mask oxygen  Post-op Assessment: Report given to RN and Post -op Vital signs reviewed and stable  Post vital signs: Reviewed and stable  Last Vitals:  Vitals Value Taken Time  BP 138/76 06/27/24 12:30  Temp 36.2 06/27/24   12:30  Pulse 73 06/27/24 12:33  Resp 18 06/27/24 12:33  SpO2 98 % 06/27/24 12:33  Vitals shown include unfiled device data.  Last Pain:  Vitals:   06/27/24 1001  TempSrc:   PainSc: 4       Patients Stated Pain Goal: 5 (06/27/24 0951)  Complications: No notable events documented.

## 2024-06-28 ENCOUNTER — Encounter (HOSPITAL_COMMUNITY): Payer: Self-pay | Admitting: Surgery

## 2024-06-28 LAB — SURGICAL PATHOLOGY

## 2024-06-30 NOTE — Anesthesia Postprocedure Evaluation (Addendum)
"   Anesthesia Post Note  Patient: Alexis Howell   Procedure(s) Performed: SPHINCTEROTOMY, ANAL HEMORRHOIDECTOMY EXAM UNDER ANESTHESIA, RECTUM     Patient location during evaluation: PACU Anesthesia Type: General Level of consciousness: awake Pain management: pain level controlled Vital Signs Assessment: post-procedure vital signs reviewed and stable Respiratory status: spontaneous breathing Cardiovascular status: blood pressure returned to baseline Postop Assessment: adequate PO intake and no apparent nausea or vomiting Anesthetic complications: no   No notable events documented.                Lauraine DASEN Colhoun      "

## 2024-07-01 ENCOUNTER — Emergency Department (HOSPITAL_COMMUNITY)

## 2024-07-01 ENCOUNTER — Inpatient Hospital Stay (HOSPITAL_COMMUNITY)
Admission: EM | Admit: 2024-07-01 | Discharge: 2024-07-03 | DRG: 948 | Disposition: A | Discharge status: Home / Self Care | Attending: Surgery | Admitting: Surgery

## 2024-07-01 DIAGNOSIS — K6289 Other specified diseases of anus and rectum: Principal | ICD-10-CM | POA: Diagnosis present

## 2024-07-01 DIAGNOSIS — G8918 Other acute postprocedural pain: Principal | ICD-10-CM | POA: Diagnosis present

## 2024-07-01 DIAGNOSIS — K648 Other hemorrhoids: Secondary | ICD-10-CM | POA: Diagnosis present

## 2024-07-01 DIAGNOSIS — K5900 Constipation, unspecified: Secondary | ICD-10-CM | POA: Diagnosis present

## 2024-07-01 DIAGNOSIS — F419 Anxiety disorder, unspecified: Secondary | ICD-10-CM | POA: Diagnosis present

## 2024-07-01 DIAGNOSIS — Z8249 Family history of ischemic heart disease and other diseases of the circulatory system: Secondary | ICD-10-CM

## 2024-07-01 DIAGNOSIS — Z515 Encounter for palliative care: Secondary | ICD-10-CM

## 2024-07-01 DIAGNOSIS — Z87891 Personal history of nicotine dependence: Secondary | ICD-10-CM

## 2024-07-01 DIAGNOSIS — E785 Hyperlipidemia, unspecified: Secondary | ICD-10-CM | POA: Diagnosis present

## 2024-07-01 LAB — BASIC METABOLIC PANEL WITH GFR
Anion gap: 12 (ref 5–15)
BUN: 8 mg/dL (ref 6–20)
CO2: 24 mmol/L (ref 22–32)
Calcium: 9 mg/dL (ref 8.9–10.3)
Chloride: 104 mmol/L (ref 98–111)
Creatinine, Ser: 0.77 mg/dL (ref 0.44–1.00)
GFR, Estimated: >60 mL/min
Glucose, Bld: 110 mg/dL — ABNORMAL HIGH (ref 70–99)
Potassium: 4.3 mmol/L (ref 3.5–5.1)
Sodium: 140 mmol/L (ref 135–145)

## 2024-07-01 LAB — CBC
HCT: 40.7 % (ref 36.0–46.0)
Hemoglobin: 13.3 g/dL (ref 12.0–15.0)
MCH: 26.4 pg (ref 26.0–34.0)
MCHC: 32.7 g/dL (ref 30.0–36.0)
MCV: 80.9 fL (ref 80.0–100.0)
Platelets: 435 K/uL — ABNORMAL HIGH (ref 150–400)
RBC: 5.03 MIL/uL (ref 3.87–5.11)
RDW: 14.1 % (ref 11.5–15.5)
WBC: 11.6 K/uL — ABNORMAL HIGH (ref 4.0–10.5)
nRBC: 0 % (ref 0.0–0.2)

## 2024-07-01 MED ORDER — METHOCARBAMOL 1000 MG/10ML IJ SOLN
1000.0000 mg | Freq: Four times a day (QID) | INTRAMUSCULAR | Status: DC | PRN
  Administered 2024-07-01 – 2024-07-02 (×2): 1000 mg via INTRAVENOUS
  Filled 2024-07-01 (×2): qty 10

## 2024-07-01 MED ORDER — SODIUM CHLORIDE 0.9 % IV SOLN: INTRAVENOUS | Status: DC

## 2024-07-01 MED ORDER — IOHEXOL 300 MG/ML  SOLN
100.0000 mL | Freq: Once | INTRAMUSCULAR | Status: AC | PRN
  Administered 2024-07-01: 100 mL via INTRAVENOUS

## 2024-07-01 MED ORDER — KETOROLAC TROMETHAMINE 30 MG/ML IJ SOLN: 30.0000 mg | Freq: Four times a day (QID) | INTRAMUSCULAR | Status: DC | PRN

## 2024-07-01 MED ORDER — POLYETHYLENE GLYCOL 3350 17 G PO PACK: 17.0000 g | PACK | Freq: Every day | ORAL | Status: DC

## 2024-07-01 MED ORDER — ENOXAPARIN SODIUM 40 MG/0.4ML IJ SOSY
40.0000 mg | PREFILLED_SYRINGE | Freq: Every day | INTRAMUSCULAR | Status: DC
  Administered 2024-07-01 – 2024-07-02 (×2): 40 mg via SUBCUTANEOUS
  Filled 2024-07-01 (×2): qty 0.4

## 2024-07-01 MED ORDER — DIAZEPAM 5 MG PO TABS
5.0000 mg | ORAL_TABLET | Freq: Three times a day (TID) | ORAL | Status: DC | PRN
  Administered 2024-07-01 – 2024-07-02 (×3): 5 mg via ORAL
  Filled 2024-07-01 (×3): qty 1

## 2024-07-01 MED ORDER — PANTOPRAZOLE SODIUM 40 MG PO TBEC
40.0000 mg | DELAYED_RELEASE_TABLET | Freq: Every evening | ORAL | Status: DC
  Administered 2024-07-02: 40 mg via ORAL
  Filled 2024-07-01: qty 1

## 2024-07-01 MED ORDER — HYDROMORPHONE HCL 4 MG PO TABS: 2.0000 mg | ORAL_TABLET | Freq: Four times a day (QID) | ORAL | 0 refills | Status: AC | PRN

## 2024-07-01 MED ORDER — DIPHENHYDRAMINE HCL 50 MG/ML IJ SOLN: 25.0000 mg | Freq: Four times a day (QID) | INTRAMUSCULAR | Status: DC | PRN

## 2024-07-01 MED ORDER — ONDANSETRON 4 MG PO TBDP: 4.0000 mg | ORAL_TABLET | Freq: Four times a day (QID) | ORAL | Status: DC | PRN

## 2024-07-01 MED ORDER — KETOROLAC TROMETHAMINE 30 MG/ML IJ SOLN
30.0000 mg | Freq: Four times a day (QID) | INTRAMUSCULAR | Status: DC
  Administered 2024-07-01 – 2024-07-02 (×2): 30 mg via INTRAVENOUS
  Filled 2024-07-01 (×2): qty 1

## 2024-07-01 MED ORDER — HYDROMORPHONE HCL 1 MG/ML IJ SOLN
1.0000 mg | Freq: Once | INTRAMUSCULAR | Status: AC
  Administered 2024-07-01: 1 mg via INTRAVENOUS
  Filled 2024-07-01: qty 1

## 2024-07-01 MED ORDER — HYDROMORPHONE HCL 2 MG PO TABS
2.0000 mg | ORAL_TABLET | ORAL | Status: DC | PRN
  Administered 2024-07-01: 4 mg via ORAL
  Filled 2024-07-01: qty 2

## 2024-07-01 MED ORDER — HYDROMORPHONE HCL 1 MG/ML IJ SOLN
1.0000 mg | INTRAMUSCULAR | Status: DC | PRN
  Administered 2024-07-02: 1 mg via INTRAVENOUS
  Filled 2024-07-01: qty 1

## 2024-07-01 MED ORDER — ONDANSETRON HCL 4 MG/2ML IJ SOLN
4.0000 mg | Freq: Four times a day (QID) | INTRAMUSCULAR | Status: DC | PRN
  Administered 2024-07-02: 4 mg via INTRAVENOUS
  Filled 2024-07-01: qty 2

## 2024-07-01 MED ORDER — SODIUM CHLORIDE 0.9 % IV BOLUS
500.0000 mL | Freq: Once | INTRAVENOUS | Status: AC
  Administered 2024-07-01: 500 mL via INTRAVENOUS

## 2024-07-01 MED ORDER — SODIUM CHLORIDE 0.9 % IV BOLUS: 1000.0000 mL | Freq: Once | INTRAVENOUS | Status: DC

## 2024-07-01 MED ORDER — DIPHENHYDRAMINE HCL 25 MG PO CAPS: 25.0000 mg | ORAL_CAPSULE | Freq: Four times a day (QID) | ORAL | Status: DC | PRN

## 2024-07-01 NOTE — ED Triage Notes (Signed)
 Patient BIB EMS from home for rectal pain. Patient had Sphincterotomy and Hemorrhoidectomy on 01.08.2025. Patient states the pain is 10/10 in her rectum and states when she tried to have a BM today it was bright red blood. Patient was taken Valium  and Oxycodone  and states it has not helped her pain. Patient unable to sleep, and eat. Patient is tearful at triage. Dr. Sheldon performed surgery at [facility].  Per EMS: 24g L hand- given 100mg  of Fentanyl  and 28.2mg  of Ketamine IV. Patient states meds given by EMS has dropped her pain 7/10.   152/86 102 100% RA 20

## 2024-07-01 NOTE — ED Provider Notes (Signed)
 " [place] EMERGENCY DEPARTMENT AT [facility] Provider Note   CSN: [id number] Arrival date & time: 07/01/24  1706     Patient presents with: No chief complaint on file.   Alexis Howell  is a 44 y.o. female w/ hx of obesity presenting with rectal pain.  Patient underwent sphincterotomy with Dr. Elspeth Schultze on 06/27/24 due to rectal fissure with chronic rectal pain with voiding.  She was noted to have grade 2 and 3 hemorrhoids internally at the time which were ligated.  Patient reports she was sent home with pain medicine but has had persistent rectal pain ever since then.  She said the oral pain medicine, which include opioids and Valium , have not helped her.  She is also using rectal care.  She has been using cold soaks and hot soaks.  She has not been able to have a bowel movement and has not eat anything in for 5 days.  She did attempt have a bowel movement earlier today and said it just about split me in half and she had bloody stool with her BM.  She is not on A/C.  EMS give the patient IV fentanyl  ketamine.   HPI     Prior to Admission medications  Medication Sig Start Date End Date Taking? Authorizing Provider  diazepam  (VALIUM ) 5 MG tablet Take 1 tablet (5 mg total) by mouth every 8 (eight) hours as needed for muscle spasms (difficulty urinating). 06/27/24  Yes Schultze Elspeth, MD  HYDROmorphone  (DILAUDID ) 4 MG tablet Take 0.5-2 tablets (2-8 mg total) by mouth every 6 (six) hours as needed for moderate pain (pain score 4-6) or severe pain (pain score 7-10). 07/01/24  Yes Schultze Elspeth, MD  hydrOXYzine  (ATARAX ) 25 MG tablet Take 25-50 mg by mouth every 6 (six) hours as needed for anxiety (sleep).   Yes [provider]  ondansetron  (ZOFRAN -ODT) 4 MG disintegrating tablet Take 1 tablet (4 mg total) by mouth every 8 (eight) hours as needed for nausea or vomiting. Take once or twice a day on a schedule 02/28/24  Yes Armbruster, Elspeth SQUIBB, MD  pantoprazole  (PROTONIX ) 40  MG tablet Take 1 tablet (40 mg total) by mouth daily. Patient taking differently: Take 40 mg by mouth every evening. 02/28/24  Yes Armbruster, Elspeth SQUIBB, MD  Ascorbic Acid  (VITAMIN C  PO) Take 100 mg by mouth in the morning and at bedtime. (Lozenge) Patient not taking: Reported on 07/01/2024    [provider]    Allergies: Patient has no known allergies.    Review of Systems  Updated Vital Signs BP (!) 149/80   Pulse 82   Temp 98.9 F (37.2 C)   Resp 18   LMP 05/09/2024 (Exact Date) Comment: having periods every 2 weeks  SpO2 93%   Physical Exam Constitutional:      General: She is not in acute distress.    Appearance: She is obese.  HENT:     Head: Normocephalic and atraumatic.  Eyes:     Conjunctiva/sclera: Conjunctivae normal.     Pupils: Pupils are equal, round, and reactive to light.  Cardiovascular:     Rate and Rhythm: Normal rate and regular rhythm.  Pulmonary:     Effort: Pulmonary effort is normal. No respiratory distress.  Abdominal:     General: There is no distension.     Tenderness: There is no abdominal tenderness.  Genitourinary:    Comments: GU exam was performed with chaperone EMT present Patient has sutures visible near rectum  with some edema of the rectum, no visible hemorrhage, does not tolerate any palpation near the rectum; digital rectal exam deferred due to recent surgery Skin:    General: Skin is warm and dry.  Neurological:     General: No focal deficit present.     Mental Status: She is alert. Mental status is at baseline.  Psychiatric:        Mood and Affect: Mood normal.        Behavior: Behavior normal.     (all labs ordered are listed, but only abnormal results are displayed) Labs Reviewed  BASIC METABOLIC PANEL WITH GFR - Abnormal; Notable for the following components:      Result Value   Glucose, Bld 110 (*)    All other components within normal limits  CBC - Abnormal; Notable for the following components:   WBC 11.6 (*)     Platelets 435 (*)    All other components within normal limits    EKG: None  Radiology: CT ABDOMEN PELVIS W CONTRAST Result Date: 07/01/2024 CLINICAL DATA:  Recent sphincterotomy and hemorrhoidectomy, rectal pain, bright red blood per rectum EXAM: CT ABDOMEN AND PELVIS WITH CONTRAST TECHNIQUE: Multidetector CT imaging of the abdomen and pelvis was performed using the standard protocol following bolus administration of intravenous contrast. RADIATION DOSE REDUCTION: This exam was performed according to the departmental dose-optimization program which includes automated exposure control, adjustment of the mA and/or kV according to patient size and/or use of iterative reconstruction technique. CONTRAST:  OMNIPAQUE  IOHEXOL  300 MG/ML  SOLN COMPARISON:  10/31/2023 FINDINGS: Lower chest: No acute pleural or parenchymal lung disease. Hepatobiliary: Mild diffuse hepatic steatosis. No focal liver abnormality. Prior cholecystectomy. No biliary duct dilation. Pancreas: Unremarkable. No pancreatic ductal dilatation or surrounding inflammatory changes. Spleen: Normal in size without focal abnormality. Adrenals/Urinary Tract: Adrenal glands are unremarkable. Kidneys are normal, without renal calculi, focal lesion, or hydronephrosis. Bladder is unremarkable. Stomach/Bowel: No bowel obstruction or ileus. Normal appendix right lower quadrant. There are no acute inflammatory changes. Soft tissue prominence at the anal rectal junction likely reflects postsurgical changes from recent sphincterotomy and hemorrhoidectomy. No evidence of fluid collection or abscess. No contrast extravasation to suggest active hemorrhage. Vascular/Lymphatic: No significant vascular findings are present. No enlarged abdominal or pelvic lymph nodes. Reproductive: Uterus and bilateral adnexa are unremarkable. 2.5 cm dominant follicle within the left ovary. Other: No free fluid or free intraperitoneal gas. No abdominal wall hernia.  Musculoskeletal: No acute or destructive bony abnormalities. Reconstructed images demonstrate no additional findings. IMPRESSION: 1. Soft tissue prominence at the anal rectal junction, likely reflecting postsurgical changes after recent sphincterotomy and hemorrhoidectomy. No evidence of fluid collection or abscess. No contrast extravasation to suggest hemorrhage. 2. Mild hepatic steatosis. Electronically Signed   By: Ozell Daring M.D.   On: 07/01/2024 21:53     .Critical Care  Performed by: Cottie Donnice PARAS, MD Authorized by: Cottie Donnice PARAS, MD   Critical care provider statement:    Critical care time (minutes):  30   Critical care time was exclusive of:  Separately billable procedures and treating other patients   Critical care was time spent personally by me on the following activities:  Ordering and performing treatments and interventions, ordering and review of laboratory studies, ordering and review of radiographic studies, pulse oximetry, review of old charts, examination of patient and evaluation of patient's response to treatment Comments:     Multiple round IV pain medication for intractable pain    Medications Ordered  in the ED  diazepam  (VALIUM ) tablet 5 mg (5 mg Oral Given 07/01/24 1830)  sodium chloride  0.9 % bolus 1,000 mL (1,000 mLs Intravenous Not Given 07/01/24 2108)  pantoprazole  (PROTONIX ) EC tablet 40 mg (has no administration in time range)  enoxaparin  (LOVENOX ) injection 40 mg (40 mg Subcutaneous Given 07/01/24 2325)  0.9 %  sodium chloride  infusion ( Intravenous New Bag/Given 07/01/24 2323)  ketorolac  (TORADOL ) 30 MG/ML injection 30 mg (30 mg Intravenous Given 07/01/24 2324)    Followed by  ketorolac  (TORADOL ) 30 MG/ML injection 30 mg (has no administration in time range)  HYDROmorphone  (DILAUDID ) injection 1 mg (has no administration in time range)  methocarbamol  (ROBAXIN ) injection 1,000 mg (1,000 mg Intravenous Given 07/01/24 2334)  diphenhydrAMINE  (BENADRYL )  capsule 25 mg (has no administration in time range)    Or  diphenhydrAMINE  (BENADRYL ) injection 25 mg (has no administration in time range)  polyethylene glycol (MIRALAX  / GLYCOLAX ) packet 17 g (has no administration in time range)  ondansetron  (ZOFRAN -ODT) disintegrating tablet 4 mg (has no administration in time range)    Or  ondansetron  (ZOFRAN ) injection 4 mg (has no administration in time range)  sodium chloride  0.9 % bolus 500 mL (0 mLs Intravenous Stopped 07/01/24 2325)  HYDROmorphone  (DILAUDID ) injection 1 mg (1 mg Intravenous Given 07/01/24 1928)  iohexol  (OMNIPAQUE ) 300 MG/ML solution 100 mL (100 mLs Intravenous Contrast Given 07/01/24 2119)  HYDROmorphone  (DILAUDID ) injection 1 mg (1 mg Intravenous Given 07/01/24 2111)                                    Medical Decision Making Amount and/or Complexity of Data Reviewed Labs: ordered. Radiology: ordered.  Risk Prescription drug management. Decision regarding hospitalization.   Patient is here with persistent rectal pain and bleeding now 4 days postoperative from sphincterotomy and possible ligation of internal hemorrhoids.  She does not have evidence of active rectal hemorrhage on arrival.  I suspect that her bleeding is likely consistent with her internal hemorrhoids, constipation and recent suture placement.  Difficult to get a full assessment of the status of her surgery given her obese habitus as well as her intolerance of any type of palpation near the rectum.  We will check electrolyte levels and creatinine function given her report that she has not eaten and has had little to drink in the past several days.  I did review external records including operative report from the general surgeon.  Supplemental history provided by EMS on arrival.  They had given her IV fentanyl  and ketamine prior to arrival.  CT imaging with no emergent findings or abscess.  Labs are largely unremarkable.  I did discuss the case with the on-call  general surgeon.  We discussed the patient's home regimen, which appears to be compliance with all of the recommended therapies for her rectal pain.  She continues to have severe intractable rectal pain requiring multiple doses of IV narcotics.  She is not able to sleep, appears visibly uncomfortable in the room.  We will plan to admit her to the surgery service and have her evaluated by their team       Final diagnoses:  None    ED Discharge Orders          Ordered    HYDROmorphone  (DILAUDID ) 4 MG tablet  Every 6 hours PRN        07/01/24 1802  Cottie Donnice PARAS, MD 07/01/24 2357  "

## 2024-07-01 NOTE — ED Notes (Signed)
 Pt refused vitals

## 2024-07-01 NOTE — Discharge Instructions (Signed)
 ##############################################  ANORECTAL SURGERY:  POST OPERATIVE INSTRUCTIONS  ######################################################################  EAT Start with a pureed / full liquid diet After 24 hours, gradually transition to a high fiber diet.    CONTROL PAIN Control pain so you can tolerate bowel movements,  walk, sleep, tolerate sneezing/coughing, and go up/down stairs.   HAVE A BOWEL MOVEMENT DAILY Keep your bowels regular to avoid problems.   Taking a fiber supplement 2-4 doses every day to keep bowels soft.   HAVE A BM BY THE SECOND POSTOPERATIVE DAY Use an antidairrheal to slow down diarrhea.   Call if not better after 2 tries  WALK Walk an hour a day.  Control your pain to do that.   CALL IF YOU HAVE PROBLEMS/CONCERNS Call if you are still struggling despite following these instructions. Call if you have concerns not answered by these instructions  ######################################################################    Take your usually prescribed home medications unless otherwise directed. Blood thinners:  You can restart any strong blood thinners after the second postoperative day  for example: COUMADIN (warfarin), XERELTO (rivaroxaban), ELIQUIS (apixaban), PLAVIX (clopidigrel), BRILINTA (ticagrelor), EFFIENT (prasugrel), PRADAXA (dabigatran), etc  Continue aspirin before & after surgery..     Some oozing/bleeding the first 1-2 weeks is common but should taper down & be small volume.    If you are passing many large clots or having uncontrolling bleeding, call your surgeon  DIET: Follow a light bland diet & liquids the first 24 hours after arrival home, such as soup, liquids, starches, etc.  Be sure to drink plenty of fluids.  Quickly advance to a usual solid diet within a few days.  Avoid fast food or heavy meals as your are more likely to get nauseated or have irregular bowels.  A low-fat, high-fiber diet for the rest of your life  is ideal.  Control pain to avoid problems with urinating or defecating:  Pain is best controlled by a usual combination of many methods TOGETHER: Warm baths/soaks or Ice packs Over the counter pain medication Prescription pain medications Topical creams  A.  Warm water baths or ice packs (30-60 minutes up to 8 times a day, especially after bowel meovements) will help.  Using ice for the first few days can help decrease swelling and bruising,  Use heat such as warm towels, sitz baths, warm baths, warm showers, etc to help relax tight/sore spots and speed recovery.   Some people prefer to use ice alone, heat alone, alternating between ice & heat.  Experiment to what works for you.    It is helpful to take an over-the-counter pain medication continuously for the first few weeks.  This helps people need to use less narcotics Choose one of the following that works best for you: Naproxen (Aleve, etc)  Two 220mg  tabs twice a day Ibuprofen (Advil, etc) Three 200mg  tabs four times a day (every meal & bedtime) Acetaminophen (Tylenol, etc) 500-650mg  four times a day (every meal & bedtime)  A  prescription for pain medication (such as oxycodone, hydrocodone, etc) should be given to you upon discharge.  Take your pain medication as prescribed.  If you are having problems/concerns with the prescription medicine (does not control pain, nausea, vomiting, rash, itching, etc), please call us [phone] to see if we need to switch you to a different pain medicine that will work better for you and/or control your side effect better. Most people will need 1-2 refills to get through the first couple of weeks when pain is the most  intense.  If you need a refill on your pain medication, please contact your pharmacy.  They will contact our office to request authorization. Prescriptions will not be filled after 5 pm or on week-ends.  If can take up to 48 hours for it to be filled & ready so avoid waiting until you  are down to thel ast pill.  A numbing topical cream (Dibucaine) may be given to you.  Many people find relief with topical creams.  Some people find it burns too much.  Experiment.  If it helps, use it.  If it burns, stop using it.  You also may receive a prescription for diazepam, a muscle relaxant to help you to be able to urinate at first easily.  It is safe to take a few doses with the other medications as long as you are not planning to drive or do anything intense.  Hopefully this can minimize the chance of needing a Foley catheter into your bladder     Have a bowel movement every day  Until you have a bowel movement after surgery, he will struggle with urinating and controlling her pain.  Take a fiber supplement (such as Metamucil, Citrucel, FiberCon, MiraLax, etc) 2-4 doses a day to help prevent constipation from occurring.     If you have not had a bowel movement the second postoperative day:  -switch to drinking liquids only -take MiraLAX double dose every 2 hours x 3 or until you have a BM -If that does not work x 3 doses, increase to 4 doses every 2 hours (Like a small bowel prep) until you have a bowel movement. -Avoid enemas or suppositories (can harm sutures/repair) -Once you start having bowel movements, adjust your fiber supplement or Miralax dosing back down until you are having 1-2 soft well formed BMs a day.  (Start at 2 doses a day & adjust up or down to avoid diarrhea or recurrent constipation)   Watch out for diarrhea.  If you have many loose bowel movements, simplify your diet to bland foods & liquids for a few days.  Stop any stool softeners and decrease your fiber supplement.  Switching to mild anti-diarrheal medications (Kayopectate, Pepto Bismol) can help.  Can try an imodium/loperamide dose.  If this worsens or does not improve, please call us.  Wound Care   a. You have some fluffed cotton on top of the anus to help catch drainage and bleeding.  THERE IS NO  PACKING INSIDE THE RECTUM -Let the cotton fall off with the first bowel movement or shower.  It is okay to reinforce or replace as needed.  Bleeding is common at first and gradually slows down after a few weeks   b. Place soft cotton balls on the anus/wounds and use an absorbent pad in your underwear as needed to catch any drainage and help keep the area.  Try to use cotton balls or pads (regular gauze or toilet paper will stick and pull, causing pain.  Cotton will come off more easily).  OK to try dry powders such as cornstarch or baby powders   c. Keep the area clean and dry.  Bathe / shower every day.  Keep the area clean by showering / bathing over the incision / wound.   It is okay to soak an open wound to help wash it.  Consider using a squeeze bottle filled with warm water to gently wash the anal area.  Wet wipes or showers / gentle washing after bowel movements  is often less traumatic than regular toilet paper.           D.  Use a Sitz Bath 4-8 times a day for relief  A sitz bath is a warm water bath taken in the sitting position that covers only the hips and buttocks. It may be used for either healing or hygiene purposes. Sitz baths are also used to relieve pain, itching, or muscle spasms.  Gently cleaned the area and the heat will help lower spasm and offer better pain control.    Fill the bathtub half full with warm water. Sit in the water and open the drain a little. Turn on the warm water to keep the tub half full. Keep the water running constantly. Soak in the water for 15 to 20 minutes. After the sitz bath, pat the affected area dry first.   d. You will often notice bleeding, especially with bowel movements.  This should slow down by the end of the first week of surgery.  It can take over a month to more fully stop.  Sitting on an ice pack can help.   e. Expect some drainage.  You often will have some blood or yellow drainage with open wounds.  Sometimes she will get a little leaking  of liquid stool until the incision/wounds have fully close down.  This should slow down by the end of the first week of surgery, but you will have occasional bleeding or drainage up to a few months after surgery until all incisions & wounds have closed.  Wear an absorbent pad or soft cotton gauze in your underwear until the drainage stops.  ACTIVITIES as tolerated:    You may resume regular (light) daily activities beginning the next day--such as daily self-care, walking, climbing stairs--gradually increasing activities as tolerated.  If you can walk 30 minutes without difficulty, it is safe to try more intense activity such as jogging, treadmill, bicycling, low-impact aerobics, swimming, etc. Save the most intensive and strenuous activity for last such as sit-ups, heavy lifting, contact sports, etc  Refrain from any heavy lifting or straining until you are off narcotics for pain control.   DO NOT PUSH THROUGH PAIN.  Let pain be your guide: If it hurts to do something, don't do it.  Pain is your body warning you to avoid that activity for another week until the pain goes down. You may drive when you are no longer taking prescription pain medication, you can comfortably sit for long periods of time, and you can safely maneuver your car and apply brakes. You may have sexual intercourse when it is comfortable.   6.  FOLLOW UP in our office Please call [facility] at [phone] to set up an appointment to see your surgeon in the office for a follow-up appointment approximately 3 weeks after your surgery. If tissue is sent for pathology, it usually takes a week for pathology results to come back.  We will make an effort to call you with results as soon as we get them.  If you have not heard back in a week and wish to know the results before your postop visit, please call our office and we will see if we can give feedback on the results Make sure that you call for this appointment the day you arrive home to  ensure a convenient appointment time.  7. IF YOU HAVE DISABILITY OR FAMILY LEAVE FORMS, BRING THEM TO THE OFFICE FOR PROCESSING.  DO NOT GIVE THEM TO YOUR  DOCTOR.        WHEN TO CALL us [phone]: Poor pain control Reactions / problems with new medications (rash/itching, nausea, etc)  Fever over 101.5 F (38.5 C) Inability to urinate Nausea and/or vomiting Worsening swelling or bruising Continued bleeding from incision. Increased pain, redness, or drainage from the incision  The clinic staff is available to answer your questions during regular business hours (8:30am-5pm).  Please don't hesitate to call and ask to speak to one of our nurses for clinical concerns.   A surgeon from [facility] is always on call at the hospitals   If you have a medical emergency, go to the nearest emergency room or call 911.    [facility] [address], [place], [place]  [address] ? MAIN: [phone] ? TOLL FREE: [phone] ? FAX [phone] [email or web address]  #####################################################

## 2024-07-01 NOTE — H&P (Signed)
 Patient is being admitted for intractable perianal pain after recent anal fissure surgery.  No evidence of perioperative complications otherwise.  Donnice POUR. Belinda, MD, [facility] Surgery  General Surgery   07/01/2024 10:50 PM

## 2024-07-02 ENCOUNTER — Encounter (HOSPITAL_COMMUNITY): Payer: Self-pay | Admitting: Surgery

## 2024-07-02 ENCOUNTER — Other Ambulatory Visit: Payer: Self-pay

## 2024-07-02 DIAGNOSIS — Z8249 Family history of ischemic heart disease and other diseases of the circulatory system: Secondary | ICD-10-CM | POA: Diagnosis not present

## 2024-07-02 DIAGNOSIS — F419 Anxiety disorder, unspecified: Secondary | ICD-10-CM | POA: Diagnosis present

## 2024-07-02 DIAGNOSIS — K6289 Other specified diseases of anus and rectum: Secondary | ICD-10-CM | POA: Diagnosis present

## 2024-07-02 DIAGNOSIS — Z515 Encounter for palliative care: Secondary | ICD-10-CM

## 2024-07-02 DIAGNOSIS — K648 Other hemorrhoids: Secondary | ICD-10-CM | POA: Diagnosis present

## 2024-07-02 DIAGNOSIS — G8918 Other acute postprocedural pain: Secondary | ICD-10-CM | POA: Diagnosis present

## 2024-07-02 DIAGNOSIS — Z87891 Personal history of nicotine dependence: Secondary | ICD-10-CM | POA: Diagnosis not present

## 2024-07-02 DIAGNOSIS — E785 Hyperlipidemia, unspecified: Secondary | ICD-10-CM | POA: Diagnosis present

## 2024-07-02 DIAGNOSIS — K5900 Constipation, unspecified: Secondary | ICD-10-CM | POA: Diagnosis present

## 2024-07-02 LAB — URINALYSIS, ROUTINE W REFLEX MICROSCOPIC
Bilirubin Urine: NEGATIVE
Glucose, UA: NEGATIVE mg/dL
Hgb urine dipstick: NEGATIVE
Ketones, ur: 20 mg/dL — AB
Leukocytes,Ua: NEGATIVE
Nitrite: NEGATIVE
Protein, ur: NEGATIVE mg/dL
Specific Gravity, Urine: 1.033 — ABNORMAL HIGH (ref 1.005–1.030)
pH: 6 (ref 5.0–8.0)

## 2024-07-02 MED ORDER — POLYETHYLENE GLYCOL 3350 17 G PO PACK
17.0000 g | PACK | Freq: Two times a day (BID) | ORAL | Status: DC
  Administered 2024-07-02: 17 g via ORAL
  Filled 2024-07-02 (×2): qty 1

## 2024-07-02 MED ORDER — DIAZEPAM 5 MG PO TABS
5.0000 mg | ORAL_TABLET | Freq: Four times a day (QID) | ORAL | Status: DC | PRN
  Administered 2024-07-02 – 2024-07-03 (×2): 5 mg via ORAL
  Filled 2024-07-02 (×2): qty 1

## 2024-07-02 MED ORDER — HYDROXYZINE HCL 25 MG PO TABS
25.0000 mg | ORAL_TABLET | Freq: Once | ORAL | Status: AC
  Administered 2024-07-02: 25 mg via ORAL
  Filled 2024-07-02: qty 1

## 2024-07-02 MED ORDER — ACETAMINOPHEN 325 MG PO TABS: 325.0000 mg | ORAL_TABLET | Freq: Four times a day (QID) | ORAL | Status: DC | PRN

## 2024-07-02 MED ORDER — METOPROLOL TARTRATE 5 MG/5ML IV SOLN: 5.0000 mg | Freq: Four times a day (QID) | INTRAVENOUS | Status: DC | PRN

## 2024-07-02 MED ORDER — METHOCARBAMOL 500 MG PO TABS
1000.0000 mg | ORAL_TABLET | Freq: Four times a day (QID) | ORAL | Status: DC
  Administered 2024-07-02 – 2024-07-03 (×6): 1000 mg via ORAL
  Filled 2024-07-02 (×6): qty 2

## 2024-07-02 MED ORDER — METHOCARBAMOL 500 MG PO TABS: 1000.0000 mg | ORAL_TABLET | Freq: Four times a day (QID) | ORAL | Status: DC | PRN

## 2024-07-02 MED ORDER — CELECOXIB 200 MG PO CAPS
200.0000 mg | ORAL_CAPSULE | Freq: Two times a day (BID) | ORAL | Status: DC
  Administered 2024-07-02 – 2024-07-03 (×3): 200 mg via ORAL
  Filled 2024-07-02 (×5): qty 1

## 2024-07-02 MED ORDER — HYDROMORPHONE HCL 1 MG/ML IJ SOLN
1.0000 mg | INTRAMUSCULAR | Status: DC | PRN
  Administered 2024-07-02: 2 mg via INTRAVENOUS
  Administered 2024-07-02: 1 mg via INTRAVENOUS
  Administered 2024-07-02 (×4): 2 mg via INTRAVENOUS
  Administered 2024-07-03: 1 mg via INTRAVENOUS
  Administered 2024-07-03: 2 mg via INTRAVENOUS
  Filled 2024-07-02 (×10): qty 2

## 2024-07-02 MED ORDER — HYDROCORT-PRAMOXINE (PERIANAL) 2.5-1 % EX CREA
TOPICAL_CREAM | Freq: Four times a day (QID) | CUTANEOUS | Status: DC
  Filled 2024-07-02 (×2): qty 30

## 2024-07-02 MED ORDER — GABAPENTIN 100 MG PO CAPS
200.0000 mg | ORAL_CAPSULE | Freq: Three times a day (TID) | ORAL | Status: DC
  Administered 2024-07-02: 200 mg via ORAL
  Filled 2024-07-02: qty 2

## 2024-07-02 MED ORDER — PROCHLORPERAZINE MALEATE 10 MG PO TABS: 10.0000 mg | ORAL_TABLET | Freq: Four times a day (QID) | ORAL | Status: DC | PRN

## 2024-07-02 MED ORDER — BISACODYL 5 MG PO TBEC
10.0000 mg | DELAYED_RELEASE_TABLET | Freq: Once | ORAL | Status: AC
  Administered 2024-07-02: 10 mg via ORAL
  Filled 2024-07-02: qty 2

## 2024-07-02 MED ORDER — NYSTATIN 100000 UNIT/GM EX POWD
Freq: Two times a day (BID) | CUTANEOUS | Status: DC
  Filled 2024-07-02 (×2): qty 15

## 2024-07-02 MED ORDER — GABAPENTIN 300 MG PO CAPS
300.0000 mg | ORAL_CAPSULE | Freq: Three times a day (TID) | ORAL | Status: DC
  Administered 2024-07-02 – 2024-07-03 (×3): 300 mg via ORAL
  Filled 2024-07-02 (×3): qty 1

## 2024-07-02 MED ORDER — LACTATED RINGERS IV BOLUS: 1000.0000 mL | Freq: Three times a day (TID) | INTRAVENOUS | Status: DC | PRN

## 2024-07-02 MED ORDER — POLYETHYLENE GLYCOL 3350 17 G PO PACK: 51.0000 g | PACK | Freq: Once | ORAL | Status: DC

## 2024-07-02 MED ORDER — VITAMIN C 500 MG PO TABS
500.0000 mg | ORAL_TABLET | Freq: Every day | ORAL | Status: DC
  Administered 2024-07-02 – 2024-07-03 (×2): 500 mg via ORAL
  Filled 2024-07-02 (×2): qty 1

## 2024-07-02 MED ORDER — MELATONIN 5 MG PO TABS
5.0000 mg | ORAL_TABLET | Freq: Every day | ORAL | Status: DC
  Administered 2024-07-02: 5 mg via ORAL
  Filled 2024-07-02: qty 1

## 2024-07-02 MED ORDER — SIMETHICONE 80 MG PO CHEW
40.0000 mg | CHEWABLE_TABLET | Freq: Four times a day (QID) | ORAL | Status: DC | PRN
  Administered 2024-07-02: 40 mg via ORAL
  Filled 2024-07-02: qty 1

## 2024-07-02 MED ORDER — HYDROXYZINE HCL 25 MG PO TABS
25.0000 mg | ORAL_TABLET | Freq: Four times a day (QID) | ORAL | Status: DC | PRN
  Administered 2024-07-02: 25 mg via ORAL

## 2024-07-02 MED ORDER — HYDROMORPHONE HCL 2 MG PO TABS
2.0000 mg | ORAL_TABLET | ORAL | Status: DC | PRN
  Administered 2024-07-02: 2 mg via ORAL
  Administered 2024-07-03: 4 mg via ORAL
  Filled 2024-07-02: qty 1
  Filled 2024-07-02 (×2): qty 2

## 2024-07-02 MED ORDER — NAPROXEN 500 MG PO TABS: 500.0000 mg | ORAL_TABLET | Freq: Two times a day (BID) | ORAL | Status: DC

## 2024-07-02 MED ORDER — POLYETHYLENE GLYCOL 3350 17 G PO PACK
51.0000 g | PACK | ORAL | Status: DC
  Administered 2024-07-02 (×2): 51 g via ORAL
  Filled 2024-07-02 (×2): qty 3

## 2024-07-02 MED ORDER — PROCHLORPERAZINE EDISYLATE 10 MG/2ML IJ SOLN: 5.0000 mg | Freq: Four times a day (QID) | INTRAMUSCULAR | Status: DC | PRN

## 2024-07-02 NOTE — Plan of Care (Signed)

## 2024-07-02 NOTE — Progress Notes (Signed)
 Called by nursing for concerns of inadequate pain control.  Patient walking in hallways restless.  Recommended oral Dilaudid  in addition to IV.  Continue scheduled Robaxin , Celebrex .  Continue sitz bath's.  Diazepam  as well.  I&O cath done.  Urinalysis without evidence of UTI.  Patient urinating.  Did caution that she will have pain with bowel movements but once she gets going that things should start to get better.

## 2024-07-02 NOTE — H&P (Signed)
 "    Alexis Howell   1981/02/25 [id number]  CARE TEAM:  PCP: Burney Darice CROME, MD  Outpatient Care Team: Patient Care Team: Burney Darice CROME, MD as PCP - General (Family Medicine) Armbruster, Elspeth SQUIBB, MD as Consulting Physician (Gastroenterology) Sheldon Elspeth, MD as Consulting Physician (General Surgery)  Inpatient Treatment Team: Treatment Team:  Sheldon Elspeth, MD Ccs, Md, MD Rexford Katheryn CROME, RN Dorn Gladis CROME, NT   This patient is a 44 y.o.female who presents today for surgical evaluation at the request of Dr Jud, [facility] ED.   Chief complaint / Reason for evaluation: Uncontrolled pain  Patient with chronic anal fissure and hemorrhoid issues status post partial lateral internal sphincterotomy and hemorrhoidectomy 5 days ago.  Has struggled with postoperative pain.  Has not had a bowel movement in a week.  Has been trying to leak only liquids for fear of having a bowel movement.  We have adjusted her narcotics after 10 mg every 4 hours oxycodone  not adequate to Dilaudid .  Tried to restart Valium .  Not sleeping well.  Felt worse.  Concerned.  Came to the ER.  Not in shock.   Assessment  Alexis Howell   44 y.o. female       Problem List:  Principal Problem:   Perianal pain   Uncontrolled pain most likely due to constipation in the setting of recent hemorrhoid and fissure  Plan:  CAT scan is reassuring that there is no perforation or abscess and nothing on exam notes that her cellulitis.  She needs to have a bowel movement.  I wrote for MiraLAX  triple dose every 2 hours until she has a bowel movement.  When she gets over that think should improve.  Switch her pain meds around.  She says that the methocarbamol  is working the best we will make that scheduled.  Tylenol  did work did not work.  Ibuprofen  did not work.  Naproxen  did not work.  Try Celebrex .  Put on low-dose gabapentin  for now and regroup.  Try oral Dilaudid  since IV Dilaudid  helps but does not last.   See if we can find a regimen that helps.  Her bladder is very distended.  I had ordered an I&O cath yesterday but that has not happened yet.  She denies any UTI symptoms but I think we should double check and get a UA since urinary  retention or cystitis can be contributing as well  Regular diet.  She needs to have food since she has not hardly eaten much in the past 5 days.  -monitor electrolytes & replace as needed  Keep K>4, Mg>2, Phos>3  -VTE prophylaxis- SCDs.  Anticoagulation prophyllaxis SQ as appropriate  -mobilize as tolerated to help recovery.  Enlist therapies in moderate/high risk patients as appropriate  I updated the patient's status to the patient and nurse  Recommendations were made.  Questions were answered.  They expressed understanding & appreciation.  -Disposition: TBD.  Probably home later today or tomorrow if pain controlled after bowel movement     I reviewed nursing notes, ED provider notes, last 24 h vitals and pain scores, last 48 h intake and output, last 24 h labs and trends, and last 24 h imaging results. I have reviewed this patient's available data, including medical history, events of note, test results, etc as part of my evaluation.  A significant portion of that time was spent in counseling.  Care during the described time interval was provided by me.  This care required straight-forward level  of medical decision making.  07/02/2024  Elspeth KYM Schultze, MD, FACS, MASCRS Esophageal, Gastrointestinal & Colorectal Surgery Robotic and Minimally Invasive Surgery  [facility] [address], [address] [place], [place] [address] [phone] Fax [phone] Main  CONTACT INFORMATION: Weekday (9AM-5PM): Call [facility] main office at [phone] Weeknight (5PM-9AM) or Weekend/Holiday: Check EPIC Web Links tab & use AMION (password  [email or web address]) for General Surgery [facility] coverage  Please, DO NOT use SecureChat   (it is not reliable communication to reach operating surgeons & will lead to a delay in care).   Epic staff messaging available for outpatient concerns needing 1-2 business day response.      07/02/2024      Past Medical History:  Diagnosis Date   Anemia    Anxiety    Arthritis    Delivery of pregnancy by cesarean section: Indication: arrest of dilation 11/26/2016   History of hiatal hernia    Hyperlipemia    Obesity    Pancreatitis    two episodes - 2022 and 2025 - unclear etiology   PCOS (polycystic ovarian syndrome)    Pneumonia    in her 20's   PONV (postoperative nausea and vomiting)    Prolonged first stage (of labor) 11/26/2016    Past Surgical History:  Procedure Laterality Date   BIOPSY  09/09/2021   Procedure: BIOPSY;  Surgeon: Teressa Toribio SQUIBB, MD;  Location: [facility];  Service: Endoscopy;;   CESAREAN SECTION N/A 11/26/2016   Procedure: CESAREAN SECTION;  Surgeon: Kandyce Sor, MD;  Location: [facility];  Service: Obstetrics;  Laterality: N/A;   CHOLECYSTECTOMY N/A 08/12/2014   Procedure: LAPAROSCOPIC CHOLECYSTECTOMY WITH INTRAOPERATIVE CHOLANGIOGRAM;  Surgeon: Camellia CHRISTELLA Blush, MD;  Location: [facility];  Service: General;  Laterality: N/A;   COLONOSCOPY WITH PROPOFOL  N/A 11/23/2023   Procedure: COLONOSCOPY WITH PROPOFOL ;  Surgeon: Leigh Elspeth SQUIBB, MD;  Location: [facility];  Service: Gastroenterology;  Laterality: N/A;   ESOPHAGOGASTRODUODENOSCOPY N/A 01/17/2024   Procedure: EGD (ESOPHAGOGASTRODUODENOSCOPY);  Surgeon: Wilhelmenia Aloha Raddle., MD;  Location: [facility];  Service: Gastroenterology;  Laterality: N/A;   ESOPHAGOGASTRODUODENOSCOPY (EGD) WITH PROPOFOL  N/A 09/09/2021   Procedure: ESOPHAGOGASTRODUODENOSCOPY (EGD) WITH PROPOFOL ;  Surgeon: Teressa Toribio SQUIBB, MD;  Location: [facility];  Service: Endoscopy;  Laterality: N/A;   EUS N/A 01/17/2024   Procedure: ULTRASOUND, UPPER GI TRACT, ENDOSCOPIC;  Surgeon: Wilhelmenia Aloha Raddle., MD;   Location: [facility];  Service: Gastroenterology;  Laterality: N/A;   EXCISION, MASS, UPPER EXTREMITY 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   Procedure: EXCISION, MASS, UPPER EXTREMITY;  Surgeon: Camella Fallow, MD;  Location: [facility];  Service: Orthopedics;  Laterality: Left;  Excision left third webspace mass with neuroplasty and repair as necessary   HAND SURGERY 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   giant cell tumor removed   HEMORRHOID SURGERY N/A 06/27/2024   Procedure: HEMORRHOIDECTOMY;  Surgeon: Schultze Elspeth, MD;  Location: [facility];  Service: General;  Laterality: N/A;   RECTAL EXAM UNDER ANESTHESIA N/A 06/27/2024   Procedure: EXAM UNDER ANESTHESIA, RECTUM;  Surgeon: Schultze Elspeth, MD;  Location: [facility];  Service: General;  Laterality: N/A;   SPHINCTEROTOMY N/A 06/27/2024   Procedure: ANNETT COUP;  Surgeon: Schultze Elspeth, MD;  Location: [facility];  Service: General;  Laterality: N/A;  GEN w/ERAS PATHWAY LOCAL   TONSILLECTOMY AND ADENOIDECTOMY     WISDOM TOOTH EXTRACTION      Social History   Socioeconomic History   Marital status: Married    Spouse name: Celestine Lander  Number of children: 1   Years of education: Not on file   Highest education level: Master's degree (e.g., MA, MS, MEng, MEd, MSW, MBA)  Occupational History   Occupation: self employed-wood working  Tobacco Use   Smoking status: Former    Current packs/day: 0.00    Types: Cigarettes    Quit date: 07/30/2009    Years since quitting: 14.9    Passive exposure: Past   Smokeless tobacco: Never  Vaping Use   Vaping status: Never Used  Substance and Sexual Activity   Alcohol use: Yes    Comment: once a month - 2-3 glasses of wine   Drug use: No   Sexual activity: Yes    Birth control/protection: None  Other Topics Concern   Not on file  Social History Narrative   Master's degree in [employer] and Rec from [employer].   Lives with her husband, their son (born 11/26/2016) and dog and cat.   Family is spread out: parents in [place] , sister in [place], born in  [place]).   Husband's family is local.   Social Drivers of Health   Tobacco Use: Medium Risk (06/27/2024)   Patient History    Smoking Tobacco Use: Former    Smokeless Tobacco Use: Never    Passive Exposure: Past  Physicist, Medical Strain: Not on file  Food Insecurity: No Food Insecurity (10/05/2023)   Hunger Vital Sign    Worried About Running Out of Food in the Last Year: Never true    Ran Out of Food in the Last Year: Never true  Transportation Needs: No Transportation Needs (10/05/2023)   PRAPARE - Administrator, Civil Service (Medical): No    Lack of Transportation (Non-Medical): No  Physical Activity: Not on file  Stress: Not on file  Social Connections: Socially Integrated (10/05/2023)   Social Connection and Isolation Panel    Frequency of Communication with Friends and Family: Three times a week    Frequency of Social Gatherings with Friends and Family: Three times a week    Attends Religious Services: More than 4 times per year    Active Member of Clubs or Organizations: Yes    Attends Banker Meetings: More than 4 times per year    Marital Status: Married  Catering Manager Violence: Not At Risk (10/05/2023)   Humiliation, Afraid, Rape, and Kick questionnaire    Fear of Current or Ex-Partner: No    Emotionally Abused: No    Physically Abused: No    Sexually Abused: No  Depression (PHQ2-9): Low Risk (01/22/2024)   Depression (PHQ2-9)    PHQ-2 Score: 0  Alcohol Screen: Not on file  Housing: Low Risk (10/05/2023)   Housing Stability Vital Sign    Unable to Pay for Housing in the Last Year: No    Number of Times Moved in the Last Year: 0    Homeless in the Last Year: No  Utilities: Not At Risk (10/05/2023)   AHC Utilities    Threatened with loss of utilities: No  Health Literacy: Not on file    Family History  Problem Relation Age of Onset   Heart disease Mother    Diverticulitis Mother    Colon polyps Father    Seizures Sister     Diverticulitis Sister    Mental illness Paternal Grandmother    Colon cancer Neg Hx    Esophageal cancer Neg Hx     Current Facility-Administered Medications  Medication Dose Route Frequency Provider Last Rate  Last Admin   0.9 %  sodium chloride  infusion   Intravenous Continuous Belinda Cough, MD 100 mL/hr at 07/01/24 2323 New Bag at 07/01/24 2323   ascorbic acid  (VITAMIN C ) tablet 500 mg  500 mg Oral Daily Sheldon Standing, MD       diazepam  (VALIUM ) tablet 5 mg  5 mg Oral Q8H PRN Sheldon Standing, MD   5 mg at 07/02/24 0450   diphenhydrAMINE  (BENADRYL ) capsule 25 mg  25 mg Oral Q6H PRN Belinda Cough, MD       Or   diphenhydrAMINE  (BENADRYL ) injection 25 mg  25 mg Intravenous Q6H PRN Belinda Cough, MD       enoxaparin  (LOVENOX ) injection 40 mg  40 mg Subcutaneous QHS Belinda Cough, MD   40 mg at 07/01/24 2325   HYDROmorphone  (DILAUDID ) injection 1-2 mg  1-2 mg Intravenous Q2H PRN Sheldon Standing, MD       ketorolac  (TORADOL ) 30 MG/ML injection 30 mg  30 mg Intravenous Q6H Belinda Cough, MD   30 mg at 07/02/24 9390   Followed by   [START ON 07/07/2024] ketorolac  (TORADOL ) 30 MG/ML injection 30 mg  30 mg Intravenous Q6H PRN Belinda Cough, MD       lactated ringers  bolus 1,000 mL  1,000 mL Intravenous Q8H PRN Sheldon Standing, MD       methocarbamol  (ROBAXIN ) injection 1,000 mg  1,000 mg Intravenous Q6H PRN Belinda Cough, MD   1,000 mg at 07/01/24 2334   ondansetron  (ZOFRAN -ODT) disintegrating tablet 4 mg  4 mg Oral Q6H PRN Belinda Cough, MD       Or   ondansetron  (ZOFRAN ) injection 4 mg  4 mg Intravenous Q6H PRN Belinda Cough, MD       pantoprazole  (PROTONIX ) EC tablet 40 mg  40 mg Oral QPM Belinda Cough, MD       polyethylene glycol (MIRALAX  / GLYCOLAX ) packet 51 g  51 g Oral Q2H Briasia Flinders, MD       sodium chloride  0.9 % bolus 1,000 mL  1,000 mL Intravenous Once Tsuei, Matthew, MD       Current Outpatient Medications  Medication Sig Dispense Refill   diazepam  (VALIUM ) 5 MG tablet Take  1 tablet (5 mg total) by mouth every 8 (eight) hours as needed for muscle spasms (difficulty urinating). 6 tablet 1   HYDROmorphone  (DILAUDID ) 4 MG tablet Take 0.5-2 tablets (2-8 mg total) by mouth every 6 (six) hours as needed for moderate pain (pain score 4-6) or severe pain (pain score 7-10). 30 tablet 0   hydrOXYzine  (ATARAX ) 25 MG tablet Take 25-50 mg by mouth every 6 (six) hours as needed for anxiety (sleep).     ondansetron  (ZOFRAN -ODT) 4 MG disintegrating tablet Take 1 tablet (4 mg total) by mouth every 8 (eight) hours as needed for nausea or vomiting. Take once or twice a day on a schedule 60 tablet 5   pantoprazole  (PROTONIX ) 40 MG tablet Take 1 tablet (40 mg total) by mouth daily. (Patient taking differently: Take 40 mg by mouth every evening.) 30 tablet 5   Ascorbic Acid  (VITAMIN C  PO) Take 100 mg by mouth in the morning and at bedtime. (Lozenge) (Patient not taking: Reported on 07/01/2024)       Allergies[1]  ROS:   All other systems reviewed & are negative except per HPI or as noted below: Constitutional:  No fevers, chills, sweats.  Weight stable Eyes:  No vision changes, No discharge HENT:  No sore throats, nasal drainage Lymph: No  neck swelling, No bruising easily Pulmonary:  No cough, productive sputum CV: No orthopnea, PND  Patient walks 30 minutes without difficulty.  No exertional chest/neck/shoulder/arm pain.  GI: No personal nor family history of GI/colon cancer, inflammatory bowel disease, irritable bowel syndrome, allergy such as Celiac Sprue, dietary/dairy problems, colitis, ulcers nor gastritis.  No recent sick contacts/gastroenteritis.  No travel outside the country.  No changes in diet.  Renal: No UTIs, No hematuria Genital:  No drainage, bleeding, masses Musculoskeletal: No severe joint pain.  Good ROM major joints Skin:  No sores or lesions Heme/Lymph:  No easy bleeding.  No swollen lymph nodes   BP 123/75   Pulse 76   Temp 98.4 F (36.9 C)   Resp 16    LMP 05/09/2024 (Exact Date) Comment: having periods every 2 weeks  SpO2 94%   Physical Exam:  Constitutional: Not cachectic.  Hygeine adequate.  Vitals signs as above.  Patient sitting on sitz bath stands up.  Moving without any difficulty.  Not toxic.  Not sickly.  Very anxious but consolable Eyes: Pupils reactive, normal extraocular movements. Sclera nonicteric Neuro: CN II-XII intact.  No major focal sensory defects.  No major motor deficits. Lymph: No head/neck/groin lymphadenopathy Psych:  No severe agitation.  Patient very tearful and anxious but consolable.   Judgment & insight Adequate, Oriented x4, HENT: Normocephalic, Mucus membranes moist.  No thrush.   Neck: Supple, No tracheal deviation.  No obvious thyromegaly Chest: No pain to chest wall compression.  Good respiratory excursion.  No audible wheezing CV:  Pulses intact.  regular rhythm.  No major extremity edema  Abdomen:  Flat Hernia: Not present. Diastasis recti: Not present. Soft.   Nondistended.  Nontender.  No hepatomegaly.  No splenomegaly  Gen:  Inguinal hernia: Not present.  Inguinal lymph nodes: without lymphadenopathy.    Rectal: Perianal ecchymosis without any abscess or cellulitis.  Mild anterior external hemorrhoidal swelling.  Increased finger tone.  No purulent drainage.  No active bleeding.  To hold off on internal exam  Ext: No obvious deformity or contracture.  Edema: Not present.  No cyanosis Skin: No major subcutaneous nodules.  Warm and dry Musculoskeletal: Severe joint rigidity not present.  No obvious clubbing.  No digital petechiae.     Results:   Labs: Results for orders placed or performed during the hospital encounter of 07/01/24 (from the past 48 hours)  Basic metabolic panel     Status: Abnormal   Collection Time: 07/01/24  5:46 PM  Result Value Ref Range   Sodium 140 135 - 145 mmol/L   Potassium 4.3 3.5 - 5.1 mmol/L   Chloride 104 98 - 111 mmol/L   CO2 24 22 - 32 mmol/L   Glucose, Bld  110 (H) 70 - 99 mg/dL    Comment: Glucose reference range applies only to samples taken after fasting for at least 8 hours.   BUN 8 6 - 20 mg/dL   Creatinine, Ser 9.22 0.44 - 1.00 mg/dL   Calcium 9.0 8.9 - 89.6 mg/dL   GFR, Estimated >39 >39 mL/min    Comment: (NOTE) Calculated using the CKD-EPI Creatinine Equation (2021)    Anion gap 12 5 - 15    Comment: Performed at [facility], [address] [place], [place] [address]  CBC     Status: Abnormal   Collection Time: 07/01/24  5:46 PM  Result Value Ref Range   WBC 11.6 (H) 4.0 - 10.5 K/uL   RBC 5.03  3.87 - 5.11 MIL/uL   Hemoglobin 13.3 12.0 - 15.0 g/dL   HCT 59.2 63.9 - 53.9 %   MCV 80.9 80.0 - 100.0 fL   MCH 26.4 26.0 - 34.0 pg   MCHC 32.7 30.0 - 36.0 g/dL   RDW 85.8 88.4 - 84.4 %   Platelets 435 (H) 150 - 400 K/uL   nRBC 0.0 0.0 - 0.2 %    Comment: Performed at [facility], [address] [place], [place] [address]    Imaging / Studies: CT ABDOMEN PELVIS W CONTRAST Result Date: 07/01/2024 CLINICAL DATA:  Recent sphincterotomy and hemorrhoidectomy, rectal pain, bright red blood per rectum EXAM: CT ABDOMEN AND PELVIS WITH CONTRAST TECHNIQUE: Multidetector CT imaging of the abdomen and pelvis was performed using the standard protocol following bolus administration of intravenous contrast. RADIATION DOSE REDUCTION: This exam was performed according to the departmental dose-optimization program which includes automated exposure control, adjustment of the mA and/or kV according to patient size and/or use of iterative reconstruction technique. CONTRAST:  OMNIPAQUE  IOHEXOL  300 MG/ML  SOLN COMPARISON:  10/31/2023 FINDINGS: Lower chest: No acute pleural or parenchymal lung disease. Hepatobiliary: Mild diffuse hepatic steatosis. No focal liver abnormality. Prior cholecystectomy. No biliary duct dilation. Pancreas: Unremarkable. No pancreatic ductal dilatation or surrounding inflammatory changes.  Spleen: Normal in size without focal abnormality. Adrenals/Urinary Tract: Adrenal glands are unremarkable. Kidneys are normal, without renal calculi, focal lesion, or hydronephrosis. Bladder is unremarkable. Stomach/Bowel: No bowel obstruction or ileus. Normal appendix right lower quadrant. There are no acute inflammatory changes. Soft tissue prominence at the anal rectal junction likely reflects postsurgical changes from recent sphincterotomy and hemorrhoidectomy. No evidence of fluid collection or abscess. No contrast extravasation to suggest active hemorrhage. Vascular/Lymphatic: No significant vascular findings are present. No enlarged abdominal or pelvic lymph nodes. Reproductive: Uterus and bilateral adnexa are unremarkable. 2.5 cm dominant follicle within the left ovary. Other: No free fluid or free intraperitoneal gas. No abdominal wall hernia. Musculoskeletal: No acute or destructive bony abnormalities. Reconstructed images demonstrate no additional findings. IMPRESSION: 1. Soft tissue prominence at the anal rectal junction, likely reflecting postsurgical changes after recent sphincterotomy and hemorrhoidectomy. No evidence of fluid collection or abscess. No contrast extravasation to suggest hemorrhage. 2. Mild hepatic steatosis. Electronically Signed   By: Ozell Daring M.D.   On: 07/01/2024 21:53    Medications / Allergies: per chart  Antibiotics: Anti-infectives (From admission, onward)    None         Note: Portions of this report may have been transcribed using voice recognition software. Every effort was made to ensure accuracy; however, inadvertent computerized transcription errors may be present.   Any transcriptional errors that result from this process are unintentional.    Elspeth KYM Schultze, MD, FACS, MASCRS Esophageal, Gastrointestinal & Colorectal Surgery Robotic and Minimally Invasive Surgery  [facility] [address], [address] [place], [place] [address] [phone] Fax [phone] Main  CONTACT INFORMATION: Weekday (9AM-5PM): Call [facility] main office at [phone] Weeknight (5PM-9AM) or Weekend/Holiday: Check EPIC Web Links tab & use AMION (password  [email or web address]) for General Surgery [facility] coverage  Please, DO NOT use SecureChat  (it is not reliable communication to reach operating surgeons & will lead to a delay in care).   Epic staff messaging available for outpatient concerns needing 1-2 business day response.       07/02/2024  7:39 AM     [1] No Known Allergies  "

## 2024-07-02 NOTE — ED Notes (Signed)
 Large potty chair placed in patient room and lined.  Patient will call when needs assist.

## 2024-07-02 NOTE — ED Notes (Signed)
 Celebrex  was due at 10am and has not been received.  Messaged pharmacy.

## 2024-07-02 NOTE — Progress Notes (Addendum)
 Concerns patient had very sharp severe pain with 1st bowel movement in a week after ##Miralax .  Husband concerned Discussed with the patient's nurse.    Patient walking in the ER restless.  Patient wanting something to spray to numb it.  No real agent for that and it usually backfires.  Can try Analpram .  Usually dry powders to help soothe the area.  She had no evidence of any abscess or leakage nor pruritus on examination this morning.  Continue sitz bath's.  Can switch to ice.  Patient normally on Atarax  for some anxiety issues as well.  Will try and add that on.  I think that is a contributor.  Valium  PRN should help with that also.  I wonder if the ER needs to be reevaluating her given her consistent anxiety in the absence of any abscess or life-threatening problem.  I had requested palliative care for insights on pain control but they say they are not available.    Usually pain goes down after bowel function has returned.  Continue multimodal pain control.  Elspeth KYM Schultze, MD, FACS, MASCRS Esophageal, Gastrointestinal & Colorectal Surgery Robotic and Minimally Invasive Surgery  [facility] [address] [place], [place] [address] [phone] Fax [phone] Main  CONTACT INFORMATION: Weekday (9AM-5PM): Call [facility] main office at [phone] Weeknight (5PM-9AM) or Weekend/Holiday: Check EPIC Web Links tab & use AMION (password  [email or web address]) for General Surgery [facility] coverage  Please, DO NOT use SecureChat  (it is not reliable communication to reach operating surgeons & will lead to a delay in care).   Epic staff messaging available for outpatient concerns needing 1-2 business day response.

## 2024-07-02 NOTE — ED Notes (Signed)
 Patient given ice pack and pain medication.

## 2024-07-02 NOTE — Plan of Care (Signed)
" °  Daily Progress Note   Patient Name: Alexis Howell        Date: 07/02/2024 DOB: 03/29/1981  Age: 44 y.o. MRN#: [id number] Attending Physician: Sheldon Standing, MD Primary Care Physician: Burney Darice CROME, MD Admit Date: 07/01/2024 Length of Stay: 0 days  PMT received consult to with post-operative pain and anxiety after anal fissure/hemorrhoid surgery. Reviewed EMR and patient does not appear to have serious life threatening illness. Unfortunately, palliative medicine team cannot assist with managing chronic/post-operative pain in the setting of no serious/life threatening illness. Discussed with surgical team and consult will be canceled at this time. Thank you.    Tinnie Radar, DO Palliative Care Provider PMT # [phone]  No Charge Note  "

## 2024-07-03 ENCOUNTER — Other Ambulatory Visit (HOSPITAL_COMMUNITY): Payer: Self-pay

## 2024-07-03 MED ORDER — PSYLLIUM 95 % PO PACK
1.0000 | PACK | Freq: Two times a day (BID) | ORAL | Status: DC
  Administered 2024-07-03: 1 via ORAL
  Filled 2024-07-03 (×3): qty 1

## 2024-07-03 MED ORDER — POLYETHYLENE GLYCOL 3350 17 G PO PACK: 17.0000 g | PACK | Freq: Two times a day (BID) | ORAL | Status: DC | PRN

## 2024-07-03 MED ORDER — CELECOXIB 200 MG PO CAPS: 200.0000 mg | ORAL_CAPSULE | Freq: Two times a day (BID) | ORAL | 1 refills | Status: AC

## 2024-07-03 MED ORDER — METHOCARBAMOL 1000 MG PO TABS: 1000.0000 mg | ORAL_TABLET | Freq: Four times a day (QID) | ORAL | 2 refills | Status: AC

## 2024-07-03 MED ORDER — HYDROMORPHONE HCL 1 MG/ML IJ SOLN
1.0000 mg | INTRAMUSCULAR | Status: DC | PRN
  Administered 2024-07-03: 2 mg via INTRAVENOUS
  Filled 2024-07-03: qty 2

## 2024-07-03 MED ORDER — PSYLLIUM 95 % PO PACK: 1.0000 | PACK | Freq: Two times a day (BID) | ORAL | 10 refills | Status: AC

## 2024-07-03 MED ORDER — HYDROCORT-PRAMOXINE (PERIANAL) 2.5-1 % EX CREA: TOPICAL_CREAM | Freq: Four times a day (QID) | CUTANEOUS | 2 refills | Status: AC

## 2024-07-03 MED ORDER — PSYLLIUM 95 % PO PACK
1.0000 | PACK | Freq: Two times a day (BID) | ORAL | 10 refills | Status: DC
  Filled 2024-07-03: qty 240, 120d supply, fill #0

## 2024-07-03 MED ORDER — METHOCARBAMOL 1000 MG PO TABS
1000.0000 mg | ORAL_TABLET | Freq: Four times a day (QID) | ORAL | 2 refills | Status: DC
  Filled 2024-07-03: qty 40, fill #0

## 2024-07-03 MED ORDER — HYDROMORPHONE HCL 2 MG PO TABS: 4.0000 mg | ORAL_TABLET | ORAL | Status: DC | PRN

## 2024-07-03 MED ORDER — CELECOXIB 200 MG PO CAPS
200.0000 mg | ORAL_CAPSULE | Freq: Two times a day (BID) | ORAL | 1 refills | Status: DC
  Filled 2024-07-03: qty 20, 10d supply, fill #0

## 2024-07-03 MED ORDER — HYDROXYZINE HCL 25 MG PO TABS: 50.0000 mg | ORAL_TABLET | Freq: Every day | ORAL | Status: DC

## 2024-07-03 NOTE — Progress Notes (Signed)
 07/03/2024  Tira Lafferty Stegall  [id number] 17-Feb-1981  CARE TEAM: PCP: Burney Darice CROME, MD  Outpatient Care Team: Patient Care Team: Burney Darice CROME, MD as PCP - General (Family Medicine) Armbruster, Elspeth SQUIBB, MD as Consulting Physician (Gastroenterology) Sheldon Elspeth, MD as Consulting Physician (General Surgery)  Inpatient Treatment Team: Treatment Team:  Sheldon Elspeth, MD [facility], Md, MD Verona Tura PEDLAR, NT Rhymer, Kyra E, RN Ellie Alfonse SAUNDERS, RN Delores Will POUR, NT Duwayne Angeline NOVAK, RN Arlyss Lonell CROME, RN   Problem List:   Principal Problem:   Perianal pain Active Problems:   Proctalgia   Palliative care encounter   * No surgery found *      Assessment [facility] Stay = 1 days)      Constipation resolved.  Still struggling with pain control but improved    Plan:  Increase oral Dilaudid  and wean off IV Dilaudid .  Patient getting improvement with Celebrex  and methocarbamol  scheduled.  Powders and Analpram  as needed.  Back on antianxiety meds.  I strongly recommend again that she do a solid diet so that her stools can get more solid.  She needs nutrition  Back to Metamucil twice daily with MiraLAX  reserved for breakthrough constipation.  Goal is 1-2 soft bowel movements a day.  -monitor electrolytes & replace as needed  Keep K>4, Mg>2, Phos>3  -VTE prophylaxis- SCDs.  Anticoagulation prophyllaxis SQ as appropriate  -mobilize as tolerated to help recovery.  Enlist therapies in moderate/high risk patients as appropriate  I updated the patient's status to the patient  Recommendations were made.  Questions were answered.  She expressed understanding & appreciation.  -Disposition: Hopefully home later today found that she has had bowel movements on usual Metamucil bowel regimen and multimodal pain control.     I reviewed last 24 h vitals and pain scores, last 48 h intake and output, last 24 h labs and trends, and last 24 h imaging results.  I  have reviewed this patient's available data, including medical history, events of note, test results, etc as part of my evaluation.   A significant portion of that time was spent in counseling. Care during the described time interval was provided by me.  This care required moderate level of medical decision making.  07/03/2024    Subjective: (Chief complaint)  Patient had numerous bowel movements.  Sharp pain.  Needed IV Dilaudid  last night.  Walking hallways.  No nausea or vomiting or fevers.  Does feel a swollen hemorrhoid.  No active bleeding except for noticed when she had bowel movements.  Objective:  Vital signs:  Vitals:   07/03/24 0031 07/03/24 0115 07/03/24 0459 07/03/24 0502  BP: (!) 156/77 (!) 140/80 (!) 157/101 132/67  Pulse: 100 100 96 76  Resp: 18 19 17 16   Temp: 99.6 F (37.6 C) 98.1 F (36.7 C) 98.2 F (36.8 C)   TempSrc: Oral Oral Oral   SpO2: 94% 92% 96% 95%  Weight:      Height:        Last BM Date : 07/02/24  Intake/Output   Yesterday:  01/13 0701 - 01/14 0700 In: 1020 [P.O.:1020] Out: 700 [Urine:700] This shift:  No intake/output data recorded.  Bowel function:  Flatus: YES  BM:  YES  Drain: (No drain)   Physical Exam:  General: Pt awake/alert in no acute distress Eyes: PERRL, normal EOM.  Sclera clear.  No icterus Neuro: CN II-XII intact w/o focal sensory/motor deficits. Lymph: No head/neck/groin lymphadenopathy Psych:  No delerium/psychosis/paranoia.  Oriented  x 4.  Less tearful today. HENT: Normocephalic, Mucus membranes moist.  No thrush Neck: Supple, No tracheal deviation.  No obvious thyromegaly Chest: No pain to chest wall compression.  Good respiratory excursion.  No audible wheezing CV:  Pulses intact.  Regular rhythm.  No major extremity edema MS: Normal AROM mjr joints.  No obvious deformity  Abdomen: Soft.  Nondistended.  Mildly tender at incisions only.  No evidence of peritonitis.  No incarcerated hernias.  Ext:    No deformity.  No mjr edema.  No cyanosis Skin: No petechiae / purpurea.  No major sores.  Warm and dry    Results:   Cultures: No results found for this or any previous visit (from the past 720 hours).  Labs: Results for orders placed or performed during the hospital encounter of 07/01/24 (from the past 48 hours)  Basic metabolic panel     Status: Abnormal   Collection Time: 07/01/24  5:46 PM  Result Value Ref Range   Sodium 140 135 - 145 mmol/L   Potassium 4.3 3.5 - 5.1 mmol/L   Chloride 104 98 - 111 mmol/L   CO2 24 22 - 32 mmol/L   Glucose, Bld 110 (H) 70 - 99 mg/dL    Comment: Glucose reference range applies only to samples taken after fasting for at least 8 hours.   BUN 8 6 - 20 mg/dL   Creatinine, Ser 9.22 0.44 - 1.00 mg/dL   Calcium 9.0 8.9 - 89.6 mg/dL   GFR, Estimated >39 >39 mL/min    Comment: (NOTE) Calculated using the CKD-EPI Creatinine Equation (2021)    Anion gap 12 5 - 15    Comment: Performed at [facility], [address] [place], [place] [address]  CBC     Status: Abnormal   Collection Time: 07/01/24  5:46 PM  Result Value Ref Range   WBC 11.6 (H) 4.0 - 10.5 K/uL   RBC 5.03 3.87 - 5.11 MIL/uL   Hemoglobin 13.3 12.0 - 15.0 g/dL   HCT 59.2 63.9 - 53.9 %   MCV 80.9 80.0 - 100.0 fL   MCH 26.4 26.0 - 34.0 pg   MCHC 32.7 30.0 - 36.0 g/dL   RDW 85.8 88.4 - 84.4 %   Platelets 435 (H) 150 - 400 K/uL   nRBC 0.0 0.0 - 0.2 %    Comment: Performed at [facility] [address], [place], [place] [address]  Urinalysis, Routine w reflex microscopic -Urine, Catheterized     Status: Abnormal   Collection Time: 07/02/24 10:36 AM  Result Value Ref Range   Color, Urine YELLOW YELLOW   APPearance CLEAR CLEAR   Specific Gravity, Urine 1.033 (H) 1.005 - 1.030   pH 6.0 5.0 - 8.0   Glucose, UA NEGATIVE NEGATIVE mg/dL   Hgb urine dipstick NEGATIVE NEGATIVE   Bilirubin Urine NEGATIVE NEGATIVE   Ketones, ur 20 (A) NEGATIVE mg/dL    Protein, ur NEGATIVE NEGATIVE mg/dL   Nitrite NEGATIVE NEGATIVE   Leukocytes,Ua NEGATIVE NEGATIVE    Comment: Performed at [facility], [address], [place], [place] [address]    Imaging / Studies: CT ABDOMEN PELVIS W CONTRAST Result Date: 07/01/2024 CLINICAL DATA:  Recent sphincterotomy and hemorrhoidectomy, rectal pain, bright red blood per rectum EXAM: CT ABDOMEN AND PELVIS WITH CONTRAST TECHNIQUE: Multidetector CT imaging of the abdomen and pelvis was performed using the standard protocol following bolus administration of intravenous contrast. RADIATION DOSE REDUCTION: This exam was performed  according to the departmental dose-optimization program which includes automated exposure control, adjustment of the mA and/or kV according to patient size and/or use of iterative reconstruction technique. CONTRAST:  OMNIPAQUE  IOHEXOL  300 MG/ML  SOLN COMPARISON:  10/31/2023 FINDINGS: Lower chest: No acute pleural or parenchymal lung disease. Hepatobiliary: Mild diffuse hepatic steatosis. No focal liver abnormality. Prior cholecystectomy. No biliary duct dilation. Pancreas: Unremarkable. No pancreatic ductal dilatation or surrounding inflammatory changes. Spleen: Normal in size without focal abnormality. Adrenals/Urinary Tract: Adrenal glands are unremarkable. Kidneys are normal, without renal calculi, focal lesion, or hydronephrosis. Bladder is unremarkable. Stomach/Bowel: No bowel obstruction or ileus. Normal appendix right lower quadrant. There are no acute inflammatory changes. Soft tissue prominence at the anal rectal junction likely reflects postsurgical changes from recent sphincterotomy and hemorrhoidectomy. No evidence of fluid collection or abscess. No contrast extravasation to suggest active hemorrhage. Vascular/Lymphatic: No significant vascular findings are present. No enlarged abdominal or pelvic lymph nodes. Reproductive: Uterus and bilateral adnexa are unremarkable. 2.5  cm dominant follicle within the left ovary. Other: No free fluid or free intraperitoneal gas. No abdominal wall hernia. Musculoskeletal: No acute or destructive bony abnormalities. Reconstructed images demonstrate no additional findings. IMPRESSION: 1. Soft tissue prominence at the anal rectal junction, likely reflecting postsurgical changes after recent sphincterotomy and hemorrhoidectomy. No evidence of fluid collection or abscess. No contrast extravasation to suggest hemorrhage. 2. Mild hepatic steatosis. Electronically Signed   By: Ozell Daring M.D.   On: 07/01/2024 21:53    Medications / Allergies: per chart  Antibiotics: Anti-infectives (From admission, onward)    None         Note: Portions of this report may have been transcribed using voice recognition software. Every effort was made to ensure accuracy; however, inadvertent computerized transcription errors may be present.   Any transcriptional errors that result from this process are unintentional.    Elspeth KYM Schultze, MD, FACS, MASCRS Esophageal, Gastrointestinal & Colorectal Surgery Robotic and Minimally Invasive Surgery  [facility] [address], [address] [place], [place] [address] [phone] Fax [phone] Main  CONTACT INFORMATION: Weekday (9AM-5PM): Call [facility] main office at [phone] Weeknight (5PM-9AM) or Weekend/Holiday: Check EPIC Web Links tab & use AMION (password  [email or web address]) for General Surgery [facility] coverage  Please, DO NOT use SecureChat  (it is not reliable communication to reach operating surgeons & will lead to a delay in care).   Epic staff messaging available for outpatient concerns needing 1-2 business day response.      07/03/2024  9:11 AM

## 2024-07-03 NOTE — Discharge Summary (Addendum)
 " Physician Discharge Summary    Alexis Howell  MRN: [id number] DOB/AGE: 1980-11-24 = 44 y.o.  Patient Care Team: Burney Darice CROME, MD as PCP - General (Family Medicine) Armbruster, Elspeth SQUIBB, MD as Consulting Physician (Gastroenterology) Sheldon Elspeth, MD as Consulting Physician (General Surgery)  Admit date: 07/01/2024  Discharge date: 07/03/2024  Hospital Stay = 1 days    Discharge Diagnoses:  Principal Problem:   Perianal pain Active Problems:   Proctalgia   Palliative care encounter      * No surgery found *  POST-OPERATIVE DIAGNOSIS:   * No surgery found *  SURGERY:  * No surgery found *    SURGEON:    * Surgery not found *  Consults:   Hospital Course:   Patient status post hemorrhoidectomy and lateral internal sphincterotomy last week.  History anxiety and insomnia.  Stroke with a lot of pain control.  Numerous medicine adjustments.  Then came to the ER.  Had not had a bowel movement in a week.  Was admitted with IV pain control.  Given aggressive bowel regimen and failure of bowel movements.  Gradual adjustment to narcotics and multimodal pain control with improvement.  Back on antianxiety meds to help.  Weaning off IV medications .    By the time of discharge, the patient was walking well the hallways, eating food, having flatus.  Pain was controlled on an oral medications.  Based on meeting discharge criteria and continuing to recover, I felt it was safe for the patient to be discharged from the hospital to further recover with close followup. Postoperative recommendations were discussed in detail.  They are written as well.  Discharged Condition: Improved  Discharge Exam: Blood pressure 137/79, pulse 79, temperature 98 F (36.7 C), temperature source Oral, resp. rate 18, height 5' 4 (1.626 m), weight (!) 146.5 kg, last menstrual period 05/09/2024, SpO2 98%.  General: Pt awake/alert/oriented x4 in No acute distress.  Walking in hallways without  difficulty or assistance. Eyes: PERRL, normal EOM.  Sclera clear.  No icterus Neuro: CN II-XII intact w/o focal sensory/motor deficits. Lymph: No head/neck/groin lymphadenopathy Psych:  No delerium/psychosis/paranoia HENT: Normocephalic, Mucus membranes moist.  No thrush Neck: Supple, No tracheal deviation Chest:  No chest wall pain w good excursion CV:  Pulses intact.  Regular rhythm MS: Normal AROM mjr joints.  No obvious deformity Abdomen: Soft.  Nondistended.  Nontender.  No evidence of peritonitis.  No incarcerated hernias. I&O region with some ecchymosis but no abscess or active bleeding.  Small swollen hemorrhoid Ext:  SCDs BLE.  No mjr edema.  No cyanosis Skin: No petechiae / purpura   Disposition:    Follow-up Information     Sheldon Elspeth, MD Follow up in 3 week(s).   Specialties: General Surgery, Colon and Rectal Surgery Why: To follow up after your operation Contact information: [address] [place] [address] [phone]                 Discharge disposition: 01-Home or Self Care       Discharge Instructions     Call MD for:  hives   Complete by: As directed    Call MD for:  persistant dizziness or light-headedness   Complete by: As directed    Call MD for:  persistant nausea and vomiting   Complete by: As directed    Call MD for:  redness, tenderness, or signs of infection (pain, swelling, redness, odor or green/yellow discharge around incision site)  Complete by: As directed    You will often notice bleeding with bowel movements.  Expect some yellow or tan drainage.  This can occur for weeks, but should be mild by the end of the first week of surgery.  Wear an absorbent pad or soft cotton gauze in your underwear until the drainage stops   Call MD for:  severe uncontrolled pain   Complete by: As directed    Diet - low sodium heart healthy   Complete by: As directed    Discharge instructions   Complete by: As directed     See Rectal Surgery instruction sheet See POUR (post-operative urinary retention) prevention protocol If total p.o. and IV fluids less than 400 mL since admission, can discharge home with a voiding trial.  If greater than 400 mL p.o. and IV total given, needs voiding trial.  If patient does not spontaneously void by 4 hours postop, I&O cath with Foley catheter.  If less than 400 mL, remove and go home.  If greater than 400 mL, leave Foley to  leg bag with leg bag teaching and have patient call [facility] office for voiding trial in 3 days   Discharge wound care:   Complete by: As directed    a. You have some fluffed cotton on top of the anus to help catch drainage and bleeding.  THERE IS NO PACKING INSIDE THE RECTUM -Let the cotton fall off with the first bowel movement or shower.  It is okay to reinforce or replace as needed.  Bleeding is common at first and occasionally tapers off after a few weeks   b. Place soft cotton balls on the anus/wounds and use an absorbent pad in your underwear as needed to catch any drainage and help keep the area.  Try to use cotton balls or pads (regular gauze or toilet paper will stick and pull, causing pain.  Cotton will come off more easily).   c. Keep the area clean and dry.  Bathe / shower every day.  Keep the area clean by showering / bathing over the incision / wound.   It is okay to soak an open wound to help wash it.  Consider using a squeeze bottle filled with warm water to gently wash the anal area.  Wet wipes or showers / gentle washing after bowel movements is often less traumatic than regular toilet paper.           D.  Use a Sitz Bath 4-8 times a day for relief  A sitz bath is a warm water bath taken in the sitting position that covers only the hips and buttocks. It may be used for either healing or hygiene purposes. Sitz baths are also used to relieve pain, itching, or muscle spasms.  Gently cleaned the area and the heat will help lower spasm and offer better  pain control.    1. Fill the bathtub half full with warm water. 2. Sit in the water and open the drain a little. 3. Turn on the warm water to keep the tub half full. Keep the water running constantly. 4. Soak in the water for 15 to 20 minutes. 5. After the sitz bath, pat the affected area dry first.   d. You will often notice bleeding, especially with bowel movements.  This should slow down by the end of the first week of surgery.  It can take over a month to more fully stop.  Sitting on an ice pack can help.  e. Expect some drainage.  You often will have some blood or yellow drainage with open wounds.  Sometimes you will get a little leaking of liquid stool at the anus until the incision/wounds have fully close down.  This should slow down by the end of the first week of surgery, but you will have occasional bleeding or drainage up to a few months after surgery until all incisions & wounds have closed.  Wear an absorbent pad or soft cotton gauze in your underwear until the drainage stops.  -You will notice bleeding, yellow drainage, and occasional stool leaking, especially with bowel movements.  This should slow down by the end of the first week of surgery, but expect some drainage for many weeks.  Replace cotton balls/pads as needed  Wear an absorbent pad or soft cotton balls on the anus as needed to catch any drainage and help keep the area clean and dry.  This is less sticking/painful than regular gauze   Driving Restrictions   Complete by: As directed    You may drive when you are no longer taking prescription pain medication, you can comfortably sit for long periods of time, and you can safely maneuver your car and apply brakes.  Expect at least 1-2 weeks   Increase activity slowly   Complete by: As directed    Lifting restrictions   Complete by: As directed    You may resume regular (light) daily activities beginning the next day-such as daily self-care, walking, climbing  stairs-gradually increasing activities as tolerated.  If you can walk 30 minutes without difficulty, it is safe to try more intense activity such as jogging, treadmill, bicycling, low-impact aerobics, swimming, etc. Save the most intensive and strenuous activity for last such as sit-ups, heavy lifting, contact sports, etc  Refrain from any heavy lifting or straining until you are off narcotics for pain control.  Remember: if it hurts to do it, don't do it: STOP   May shower / Bathe   Complete by: As directed    Warm water sitz baths/tub soaks/showers  x 20-30 minutes, up to 8 times a day for comfort  Just comfortable warm water.  Avoid salts/soaps.  Can try using ice packs as an alternative.  Consider using a squeeze bottle of warm water to rinse the perianal region.   May walk up steps   Complete by: As directed    Sexual Activity Restrictions   Complete by: As directed    You may have sexual intercourse when it is comfortable. If it hurts to do it, STOP.       Allergies as of 07/03/2024   No Known Allergies      Medication List     STOP taking these medications    NONFORMULARY OR COMPOUNDED ITEM   oxyCODONE -acetaminophen  5-325 MG tablet Commonly known as: Percocet       TAKE these medications    celecoxib  200 MG capsule Commonly known as: CELEBREX  Take 1 capsule (200 mg total) by mouth 2 (two) times daily.   diazepam  5 MG tablet Commonly known as: VALIUM  Take 1 tablet (5 mg total) by mouth every 8 (eight) hours as needed for muscle spasms (difficulty urinating).   hydrocortisone -pramoxine 2.5-1 % rectal cream Commonly known as: ANALPRAM -HC Place rectally 4 (four) times daily.   HYDROmorphone  4 MG tablet Commonly known as: DILAUDID  Take 0.5-2 tablets (2-8 mg total) by mouth every 6 (six) hours as needed for moderate pain (pain score 4-6) or severe pain (pain score  7-10).   hydrOXYzine  25 MG tablet Commonly known as: ATARAX  Take 25-50 mg by mouth every 6 (six)  hours as needed for anxiety (sleep).   Methocarbamol  1000 MG Tabs Take 1,000 mg by mouth 4 (four) times daily.   ondansetron  4 MG disintegrating tablet Commonly known as: ZOFRAN -ODT Take 1 tablet (4 mg total) by mouth every 8 (eight) hours as needed for nausea or vomiting. Take once or twice a day on a schedule   pantoprazole  40 MG tablet Commonly known as: Protonix  Take 1 tablet (40 mg total) by mouth daily. What changed: when to take this   psyllium 95 % Pack Commonly known as: HYDROCIL/METAMUCIL Take 1 packet by mouth 2 (two) times daily.   VITAMIN C  PO Take 100 mg by mouth in the morning and at bedtime. (Lozenge)               Discharge Care Instructions  (From admission, onward)           Start     Ordered   07/03/24 0000  Discharge wound care:       Comments: a. You have some fluffed cotton on top of the anus to help catch drainage and bleeding.  THERE IS NO PACKING INSIDE THE RECTUM -Let the cotton fall off with the first bowel movement or shower.  It is okay to reinforce or replace as needed.  Bleeding is common at first and occasionally tapers off after a few weeks   b. Place soft cotton balls on the anus/wounds and use an absorbent pad in your underwear as needed to catch any drainage and help keep the area.  Try to use cotton balls or pads (regular gauze or toilet paper will stick and pull, causing pain.  Cotton will come off more easily).   c. Keep the area clean and dry.  Bathe / shower every day.  Keep the area clean by showering / bathing over the incision / wound.   It is okay to soak an open wound to help wash it.  Consider using a squeeze bottle filled with warm water to gently wash the anal area.  Wet wipes or showers / gentle washing after bowel movements is often less traumatic than regular toilet paper.           D.  Use a Sitz Bath 4-8 times a day for relief  A sitz bath is a warm water bath taken in the sitting position that covers only the hips  and buttocks. It may be used for either healing or hygiene purposes. Sitz baths are also used to relieve pain, itching, or muscle spasms.  Gently cleaned the area and the heat will help lower spasm and offer better pain control.    1. Fill the bathtub half full with warm water. 2. Sit in the water and open the drain a little. 3. Turn on the warm water to keep the tub half full. Keep the water running constantly. 4. Soak in the water for 15 to 20 minutes. 5. After the sitz bath, pat the affected area dry first.   d. You will often notice bleeding, especially with bowel movements.  This should slow down by the end of the first week of surgery.  It can take over a month to more fully stop.  Sitting on an ice pack can help.   e. Expect some drainage.  You often will have some blood or yellow drainage with open wounds.  Sometimes you will get a  little leaking of liquid stool at the anus until the incision/wounds have fully close down.  This should slow down by the end of the first week of surgery, but you will have occasional bleeding or drainage up to a few months after surgery until all incisions & wounds have closed.  Wear an absorbent pad or soft cotton gauze in your underwear until the drainage stops.  -You will notice bleeding, yellow drainage, and occasional stool leaking, especially with bowel movements.  This should slow down by the end of the first week of surgery, but expect some drainage for many weeks.  Replace cotton balls/pads as needed  Wear an absorbent pad or soft cotton balls on the anus as needed to catch any drainage and help keep the area clean and dry.  This is less sticking/painful than regular gauze   07/03/24 1451            Significant Diagnostic Studies:  Results for orders placed or performed during the hospital encounter of 07/01/24 (from the past 72 hours)  Basic metabolic panel     Status: Abnormal   Collection Time: 07/01/24  5:46 PM  Result Value Ref Range    Sodium 140 135 - 145 mmol/L   Potassium 4.3 3.5 - 5.1 mmol/L   Chloride 104 98 - 111 mmol/L   CO2 24 22 - 32 mmol/L   Glucose, Bld 110 (H) 70 - 99 mg/dL    Comment: Glucose reference range applies only to samples taken after fasting for at least 8 hours.   BUN 8 6 - 20 mg/dL   Creatinine, Ser 9.22 0.44 - 1.00 mg/dL   Calcium 9.0 8.9 - 89.6 mg/dL   GFR, Estimated >39 >39 mL/min    Comment: (NOTE) Calculated using the CKD-EPI Creatinine Equation (2021)    Anion gap 12 5 - 15    Comment: Performed at [facility], [address] [place], [place] [address]  CBC     Status: Abnormal   Collection Time: 07/01/24  5:46 PM  Result Value Ref Range   WBC 11.6 (H) 4.0 - 10.5 K/uL   RBC 5.03 3.87 - 5.11 MIL/uL   Hemoglobin 13.3 12.0 - 15.0 g/dL   HCT 59.2 63.9 - 53.9 %   MCV 80.9 80.0 - 100.0 fL   MCH 26.4 26.0 - 34.0 pg   MCHC 32.7 30.0 - 36.0 g/dL   RDW 85.8 88.4 - 84.4 %   Platelets 435 (H) 150 - 400 K/uL   nRBC 0.0 0.0 - 0.2 %    Comment: Performed at [facility], [address], [place], [place] [address]  Urinalysis, Routine w reflex microscopic -Urine, Catheterized     Status: Abnormal   Collection Time: 07/02/24 10:36 AM  Result Value Ref Range   Color, Urine YELLOW YELLOW   APPearance CLEAR CLEAR   Specific Gravity, Urine 1.033 (H) 1.005 - 1.030   pH 6.0 5.0 - 8.0   Glucose, UA NEGATIVE NEGATIVE mg/dL   Hgb urine dipstick NEGATIVE NEGATIVE   Bilirubin Urine NEGATIVE NEGATIVE   Ketones, ur 20 (A) NEGATIVE mg/dL   Protein, ur NEGATIVE NEGATIVE mg/dL   Nitrite NEGATIVE NEGATIVE   Leukocytes,Ua NEGATIVE NEGATIVE    Comment: Performed at [facility], [address] [place], [place] [address]    CT ABDOMEN PELVIS W CONTRAST Result Date: 07/01/2024 CLINICAL DATA:  Recent sphincterotomy and hemorrhoidectomy, rectal pain, bright red blood per rectum EXAM: CT ABDOMEN AND PELVIS  WITH CONTRAST TECHNIQUE: Multidetector CT  imaging of the abdomen and pelvis was performed using the standard protocol following bolus administration of intravenous contrast. RADIATION DOSE REDUCTION: This exam was performed according to the departmental dose-optimization program which includes automated exposure control, adjustment of the mA and/or kV according to patient size and/or use of iterative reconstruction technique. CONTRAST:  OMNIPAQUE  IOHEXOL  300 MG/ML  SOLN COMPARISON:  10/31/2023 FINDINGS: Lower chest: No acute pleural or parenchymal lung disease. Hepatobiliary: Mild diffuse hepatic steatosis. No focal liver abnormality. Prior cholecystectomy. No biliary duct dilation. Pancreas: Unremarkable. No pancreatic ductal dilatation or surrounding inflammatory changes. Spleen: Normal in size without focal abnormality. Adrenals/Urinary Tract: Adrenal glands are unremarkable. Kidneys are normal, without renal calculi, focal lesion, or hydronephrosis. Bladder is unremarkable. Stomach/Bowel: No bowel obstruction or ileus. Normal appendix right lower quadrant. There are no acute inflammatory changes. Soft tissue prominence at the anal rectal junction likely reflects postsurgical changes from recent sphincterotomy and hemorrhoidectomy. No evidence of fluid collection or abscess. No contrast extravasation to suggest active hemorrhage. Vascular/Lymphatic: No significant vascular findings are present. No enlarged abdominal or pelvic lymph nodes. Reproductive: Uterus and bilateral adnexa are unremarkable. 2.5 cm dominant follicle within the left ovary. Other: No free fluid or free intraperitoneal gas. No abdominal wall hernia. Musculoskeletal: No acute or destructive bony abnormalities. Reconstructed images demonstrate no additional findings. IMPRESSION: 1. Soft tissue prominence at the anal rectal junction, likely reflecting postsurgical changes after recent sphincterotomy and hemorrhoidectomy. No evidence of fluid collection or abscess. No contrast  extravasation to suggest hemorrhage. 2. Mild hepatic steatosis. Electronically Signed   By: Ozell Daring M.D.   On: 07/01/2024 21:53    Past Medical History:  Diagnosis Date   Anemia    Anxiety    Arthritis    Delivery of pregnancy by cesarean section: Indication: arrest of dilation 11/26/2016   History of hiatal hernia    Hyperlipemia    Obesity    Pancreatitis    two episodes - 2022 and 2025 - unclear etiology   PCOS (polycystic ovarian syndrome)    Pneumonia    in her 20's   PONV (postoperative nausea and vomiting)    Prolonged first stage (of labor) 11/26/2016    Past Surgical History:  Procedure Laterality Date   BIOPSY  09/09/2021   Procedure: BIOPSY;  Surgeon: Teressa Toribio SQUIBB, MD;  Location: [facility];  Service: Endoscopy;;   CESAREAN SECTION N/A 11/26/2016   Procedure: CESAREAN SECTION;  Surgeon: Kandyce Sor, MD;  Location: [facility];  Service: Obstetrics;  Laterality: N/A;   CHOLECYSTECTOMY N/A 08/12/2014   Procedure: LAPAROSCOPIC CHOLECYSTECTOMY WITH INTRAOPERATIVE CHOLANGIOGRAM;  Surgeon: Camellia CHRISTELLA Blush, MD;  Location: [facility];  Service: General;  Laterality: N/A;   COLONOSCOPY WITH PROPOFOL  N/A 11/23/2023   Procedure: COLONOSCOPY WITH PROPOFOL ;  Surgeon: Leigh Elspeth SQUIBB, MD;  Location: [facility];  Service: Gastroenterology;  Laterality: N/A;   ESOPHAGOGASTRODUODENOSCOPY N/A 01/17/2024   Procedure: EGD (ESOPHAGOGASTRODUODENOSCOPY);  Surgeon: Wilhelmenia Aloha Raddle., MD;  Location: [facility];  Service: Gastroenterology;  Laterality: N/A;   ESOPHAGOGASTRODUODENOSCOPY (EGD) WITH PROPOFOL  N/A 09/09/2021   Procedure: ESOPHAGOGASTRODUODENOSCOPY (EGD) WITH PROPOFOL ;  Surgeon: Teressa Toribio SQUIBB, MD;  Location: [facility];  Service: Endoscopy;  Laterality: N/A;   EUS N/A 01/17/2024   Procedure: ULTRASOUND, UPPER GI TRACT, ENDOSCOPIC;  Surgeon: Wilhelmenia Aloha Raddle., MD;  Location: [facility];  Service: Gastroenterology;  Laterality: N/A;    EXCISION, MASS, UPPER EXTREMITY 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   Procedure: EXCISION, MASS, UPPER EXTREMITY;  Surgeon: Camella Fallow, MD;  Location: [facility];  Service: Orthopedics;  Laterality: Left;  Excision left third webspace mass with neuroplasty and repair as necessary   HAND SURGERY 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   giant cell tumor removed   HEMORRHOID SURGERY N/A 06/27/2024   Procedure: HEMORRHOIDECTOMY;  Surgeon: Sheldon Standing, MD;  Location: [facility];  Service: General;  Laterality: N/A;   RECTAL EXAM UNDER ANESTHESIA N/A 06/27/2024   Procedure: EXAM UNDER ANESTHESIA, RECTUM;  Surgeon: Sheldon Standing, MD;  Location: [facility];  Service: General;  Laterality: N/A;   SPHINCTEROTOMY N/A 06/27/2024   Procedure: ANNETT COUP;  Surgeon: Sheldon Standing, MD;  Location: [facility];  Service: General;  Laterality: N/A;  GEN w/ERAS PATHWAY LOCAL   TONSILLECTOMY AND ADENOIDECTOMY     WISDOM TOOTH EXTRACTION      Social History   Socioeconomic History   Marital status: Married    Spouse name: Celestine Lander   Number of children: 1   Years of education: Not on file   Highest education level: Master's degree (e.g., MA, MS, MEng, MEd, MSW, MBA)  Occupational History   Occupation: self employed-wood working  Tobacco Use   Smoking status: Former    Current packs/day: 0.00    Types: Cigarettes    Quit date: 07/30/2009    Years since quitting: 14.9    Passive exposure: Past   Smokeless tobacco: Never  Vaping Use   Vaping status: Never Used  Substance and Sexual Activity   Alcohol use: Yes    Comment: once a month - 2-3 glasses of wine   Drug use: No   Sexual activity: Yes    Birth control/protection: None  Other Topics Concern   Not on file  Social History Narrative   Master's degree in [place] and [employer] from [employer].   Lives with her husband, their son (born 11/26/2016) and dog and cat.   Family is spread out: parents in [place] , sister in [place], born in [place]).   Husband's family is local.   Social Drivers of Health    Tobacco Use: Medium Risk (07/02/2024)   Patient History    Smoking Tobacco Use: Former    Smokeless Tobacco Use: Never    Passive Exposure: Past  Physicist, Medical Strain: Not on file  Food Insecurity: No Food Insecurity (07/02/2024)   Epic    Worried About Programme Researcher, Broadcasting/film/video in the Last Year: Never true    Ran Out of Food in the Last Year: Never true  Transportation Needs: No Transportation Needs (07/02/2024)   Epic    Lack of Transportation (Medical): No    Lack of Transportation (Non-Medical): No  Physical Activity: Not on file  Stress: Not on file  Social Connections: Socially Integrated (10/05/2023)   Social Connection and Isolation Panel    Frequency of Communication with Friends and Family: Three times a week    Frequency of Social Gatherings with Friends and Family: Three times a week    Attends Religious Services: More than 4 times per year    Active Member of Clubs or Organizations: Yes    Attends Banker Meetings: More than 4 times per year    Marital Status: Married  Catering Manager Violence: Not At Risk (07/02/2024)   Epic    Fear of Current or Ex-Partner: No    Emotionally Abused: No    Physically Abused: No    Sexually Abused: No  Depression (PHQ2-9): Low Risk (01/22/2024)   Depression (PHQ2-9)    PHQ-2 Score:  0  Alcohol Screen: Not on file  Housing: Low Risk (07/02/2024)   Epic    Unable to Pay for Housing in the Last Year: No    Number of Times Moved in the Last Year: 0    Homeless in the Last Year: No  Utilities: Not At Risk (07/02/2024)   Epic    Threatened with loss of utilities: No  Health Literacy: Not on file    Family History  Problem Relation Age of Onset   Heart disease Mother    Diverticulitis Mother    Colon polyps Father    Seizures Sister    Diverticulitis Sister    Mental illness Paternal Grandmother    Colon cancer Neg Hx    Esophageal cancer Neg Hx     Current Facility-Administered Medications  Medication Dose Route  Frequency Provider Last Rate Last Admin   acetaminophen  (TYLENOL ) tablet 325-650 mg  325-650 mg Oral Q6H PRN Sheldon Standing, MD       ascorbic acid  (VITAMIN C ) tablet 500 mg  500 mg Oral Daily Sheldon Standing, MD   500 mg at 07/03/24 9155   celecoxib  (CELEBREX ) capsule 200 mg  200 mg Oral BID Sheldon Standing, MD   200 mg at 07/03/24 0843   diazepam  (VALIUM ) tablet 5 mg  5 mg Oral Q6H PRN Sheldon Standing, MD   5 mg at 07/03/24 0428   enoxaparin  (LOVENOX ) injection 40 mg  40 mg Subcutaneous QHS Belinda Cough, MD   40 mg at 07/02/24 2250   gabapentin  (NEURONTIN ) capsule 300 mg  300 mg Oral TID Sheldon Standing, MD   300 mg at 07/03/24 0844   hydrocortisone -pramoxine (ANALPRAM -HC) 2.5-1 % rectal cream   Rectal QID Sheldon Standing, MD   Given at 07/03/24 0844   HYDROmorphone  (DILAUDID ) injection 1-2 mg  1-2 mg Intravenous Q4H PRN Sheldon Standing, MD   2 mg at 07/03/24 1345   HYDROmorphone  (DILAUDID ) tablet 4 mg  4 mg Oral Q4H PRN Sheldon Standing, MD       hydrOXYzine  (ATARAX ) tablet 25-50 mg  25-50 mg Oral Q6H PRN Sheldon Standing, MD   25 mg at 07/02/24 1436   hydrOXYzine  (ATARAX ) tablet 50 mg  50 mg Oral QHS Andreia Gandolfi, MD       lactated ringers  bolus 1,000 mL  1,000 mL Intravenous Q8H PRN December Hedtke, Standing, MD       melatonin tablet 5 mg  5 mg Oral QHS Azim Gillingham, MD   5 mg at 07/02/24 2250   methocarbamol  (ROBAXIN ) tablet 1,000 mg  1,000 mg Oral QID Sheldon Standing, MD   1,000 mg at 07/03/24 1344   metoprolol  tartrate (LOPRESSOR ) injection 5 mg  5 mg Intravenous Q6H PRN Sheldon Standing, MD       nystatin  (MYCOSTATIN /NYSTOP ) topical powder   Topical BID Sheldon Standing, MD   Given at 07/03/24 0844   ondansetron  (ZOFRAN -ODT) disintegrating tablet 4 mg  4 mg Oral Q6H PRN Belinda Cough, MD       Or   ondansetron  (ZOFRAN ) injection 4 mg  4 mg Intravenous Q6H PRN Belinda Cough, MD   4 mg at 07/02/24 2020   pantoprazole  (PROTONIX ) EC tablet 40 mg  40 mg Oral QPM Belinda Cough, MD   40 mg at 07/02/24 1813   polyethylene  glycol (MIRALAX  / GLYCOLAX ) packet 17 g  17 g Oral Q12H PRN Sheldon Standing, MD       prochlorperazine  (COMPAZINE ) tablet 10 mg  10 mg Oral Q6H PRN Sheldon Standing,  MD       Or   prochlorperazine  (COMPAZINE ) injection 5-10 mg  5-10 mg Intravenous Q6H PRN Sheldon Standing, MD       psyllium (HYDROCIL/METAMUCIL) 1 packet  1 packet Oral BID Sheldon Standing, MD   1 packet at 07/03/24 1123   simethicone  (MYLICON) chewable tablet 40 mg  40 mg Oral Q6H PRN Sheldon Standing, MD   40 mg at 07/02/24 1435   sodium chloride  0.9 % bolus 1,000 mL  1,000 mL Intravenous Once Belinda Cough, MD         Allergies[1]  Signed:   Standing KYM Sheldon, MD, FACS, MASCRS Esophageal, Gastrointestinal & Colorectal Surgery Robotic and Minimally Invasive Surgery  [facility] [address] [place], [place] [address] [phone] Fax [phone] Main  CONTACT INFORMATION: Weekday (9AM-5PM): Call [facility] main office at [phone] Weeknight (5PM-9AM) or Weekend/Holiday: Check EPIC Web Links tab & use AMION (password  [email or web address]) for General Surgery [facility] coverage  Please, DO NOT use SecureChat  (it is not reliable communication to reach operating surgeons & will lead to a delay in care).   Epic staff messaging available for outpatient concerns needing 1-2 business day response.      07/03/2024, 2:58 PM      [1] No Known Allergies  "

## 2024-07-03 NOTE — Progress Notes (Signed)
 Spoke with GORMAN Schultze, MD regarding patients discharge readiness. He confirmed that she is safe to go from his standpoint.

## 2024-07-03 NOTE — Progress Notes (Signed)
 Told patient that S Gross confirmed that she is cleared to discharge and that he will not be refilling Valium  medication. Patient is visibly upset and does not feel that her pain is controlled enough to discharge. Lanning F,RN assisted me with patient education and discharge instructions. Patient took AVS and walked out.

## 2024-07-03 NOTE — Progress Notes (Signed)
 Patient was read AVS by myself and Susan,RN. Patient had no questions regarding discharge instructions. As patient was headed toward elevator with husband they stopped at the nurse's station with a question regarding a Valium  prescription. Patient refused to discharge without Valium  being filled. Called on call MD Georgette Poli to address issue. Blackman,MD stated that he did not feel comfortable refilling prescription himself and that he would reach out to Gross,MD. I called [employer] and they stated that their was no additional refill ordered, as patient has already picked up refill. Blackman,MD then called back after talking with Dr. Sheldon and stated that Gross did not wish to refill medication and that patient is cleared for discharge.

## 2024-07-03 NOTE — TOC Initial Note (Signed)
 Transition of Care [facility]) - Initial/Assessment Note    Patient Details  Name: Alexis Howell  MRN: [id number] Date of Birth: 1980-11-03  Transition of [facility] CM/SW Contact:    Alfonse JONELLE Rex, RN Phone Number: 07/03/2024, 2:43 PM  Clinical Narrative:     Admitted from home, presented to ED with  c/o postoperative pain. Surgery consult.  PCP and insurance on file, family contacts on file.  CM following for dc needs             Expected Discharge Plan: Home/Self Care Barriers to Discharge: Continued Medical Work up   Patient Goals and CMS Choice Patient states their goals for this hospitalization and ongoing recovery are:: return home          Expected Discharge Plan and Services       Living arrangements for the past 2 months: Single Family Home                                      Prior Living Arrangements/Services Living arrangements for the past 2 months: Single Family Home Lives with:: Spouse Patient language and need for interpreter reviewed:: Yes        Need for Family Participation in Patient Care: Yes (Comment) Care giver support system in place?: Yes (comment)   Criminal Activity/Legal Involvement Pertinent to Current Situation/Hospitalization: No - Comment as needed  Activities of Daily Living   ADL Screening (condition at time of admission) Independently performs ADLs?: Yes (appropriate for developmental age) Is the patient deaf or have difficulty hearing?: No Does the patient have difficulty seeing, even when wearing glasses/contacts?: No Does the patient have difficulty concentrating, remembering, or making decisions?: No  Permission Sought/Granted                  Emotional Assessment       Orientation: : Oriented to Self, Oriented to Place, Oriented to  Time, Oriented to Situation Alcohol / Substance Use: Not Applicable Psych Involvement: No (comment)  Admission diagnosis:  Rectal pain [K62.89] 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] Perianal pain [K62.89] Patient Active Problem List   Diagnosis Date Noted   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   Palliative care encounter 07/02/2024   Perianal pain 07/01/2024   Thickened endometrium 01/24/2024   Frequent menstruation 01/24/2024   BMI 50.0-59.9, adult (HCC) 01/22/2024   Dysmenorrhea 01/22/2024   Menorrhagia with irregular cycle 01/22/2024   PCOS (polycystic ovarian syndrome) 01/22/2024   History of ovarian cyst 01/22/2024   Gastritis without bleeding 01/17/2024   History of pancreatitis 01/17/2024   Mucosal abnormality of duodenum 01/17/2024   Blood in stool 11/23/2023   Hemorrhoids 11/23/2023   Benign neoplasm of colon 11/23/2023   Acute pancreatitis 10/04/2023   Unspecified ovarian cyst, left side 07/07/2021   PCP:  Burney Darice LITTIE, MD Pharmacy:   [facility] - [place], [place] - [address] [place] [address] Phone: [phone] Fax: [phone]  [facility] - [place], [place] - [address] [place] [address] Phone: [phone] Fax: [phone]  [facility] [place], [place] - [address] [place] [address] Phone: [phone] Fax: [phone]     Social Drivers of Health (SDOH) Social History: SDOH Screenings   Food Insecurity: No Food Insecurity (07/02/2024)  Housing: Low Risk (07/02/2024)  Transportation Needs:  No Transportation Needs (07/02/2024)  Utilities: Not At Risk (07/02/2024)  Depression (PHQ2-9): Low Risk (01/22/2024)  Social Connections: Socially Integrated (10/05/2023)  Tobacco Use: Medium Risk (07/02/2024)   SDOH Interventions:     Readmission Risk Interventions    07/03/2024    2:42 PM  Readmission Risk Prevention Plan  Post Dischage Appt Complete  Medication Screening Complete  Transportation Screening Complete
